# Patient Record
Sex: Male | Born: 1955 | Race: White | Hispanic: No | Marital: Married | Smoking: Never smoker
Health system: Southern US, Community
[De-identification: ages and names within clinical notes are randomized; demographics above are authoritative.]

## PROBLEM LIST (undated history)

## (undated) DIAGNOSIS — R51 Headache: Secondary | ICD-10-CM

## (undated) DIAGNOSIS — I10 Essential (primary) hypertension: Secondary | ICD-10-CM

## (undated) DIAGNOSIS — T7840XA Allergy, unspecified, initial encounter: Secondary | ICD-10-CM

## (undated) DIAGNOSIS — E785 Hyperlipidemia, unspecified: Secondary | ICD-10-CM

## (undated) DIAGNOSIS — J329 Chronic sinusitis, unspecified: Secondary | ICD-10-CM

## (undated) DIAGNOSIS — K449 Diaphragmatic hernia without obstruction or gangrene: Secondary | ICD-10-CM

## (undated) HISTORY — PX: WISDOM TOOTH EXTRACTION: SHX21

## (undated) HISTORY — DX: Essential (primary) hypertension: I10

## (undated) HISTORY — PX: OTHER SURGICAL HISTORY: SHX169

## (undated) HISTORY — DX: Hyperlipidemia, unspecified: E78.5

## (undated) HISTORY — PX: COLONOSCOPY: SHX174

## (undated) HISTORY — DX: Chronic sinusitis, unspecified: J32.9

## (undated) HISTORY — DX: Headache: R51

## (undated) HISTORY — DX: Allergy, unspecified, initial encounter: T78.40XA

---

## 2004-04-08 ENCOUNTER — Ambulatory Visit: Payer: Self-pay | Admitting: Internal Medicine

## 2004-04-16 ENCOUNTER — Ambulatory Visit: Payer: Self-pay | Admitting: Internal Medicine

## 2004-10-08 ENCOUNTER — Ambulatory Visit: Payer: Self-pay | Admitting: Internal Medicine

## 2004-11-24 ENCOUNTER — Ambulatory Visit: Payer: Self-pay | Admitting: Internal Medicine

## 2005-05-17 ENCOUNTER — Ambulatory Visit: Payer: Self-pay | Admitting: Internal Medicine

## 2005-05-24 ENCOUNTER — Ambulatory Visit: Payer: Self-pay | Admitting: Internal Medicine

## 2006-01-18 ENCOUNTER — Ambulatory Visit: Payer: Self-pay | Admitting: Internal Medicine

## 2006-01-25 ENCOUNTER — Ambulatory Visit: Payer: Self-pay | Admitting: Internal Medicine

## 2006-08-05 ENCOUNTER — Ambulatory Visit: Payer: Self-pay | Admitting: Internal Medicine

## 2006-08-05 LAB — CONVERTED CEMR LAB
ALT: 31 units/L (ref 0–40)
AST: 27 units/L (ref 0–37)
Albumin: 4.4 g/dL (ref 3.5–5.2)
Alkaline Phosphatase: 65 units/L (ref 39–117)
Basophils Absolute: 0 10*3/uL (ref 0.0–0.1)
Calcium: 9.3 mg/dL (ref 8.4–10.5)
Chloride: 105 meq/L (ref 96–112)
Eosinophils Absolute: 0.1 10*3/uL (ref 0.0–0.6)
Eosinophils Relative: 1.8 % (ref 0.0–5.0)
GFR calc non Af Amer: 84 mL/min
Glucose, Bld: 74 mg/dL (ref 70–99)
HDL: 42.6 mg/dL (ref >39.0)
MCV: 91.6 fL (ref 78.0–100.0)
Platelets: 121 10*3/uL — ABNORMAL LOW (ref 150–400)
RBC: 4.98 M/uL (ref 4.22–5.81)
Triglycerides: 276 mg/dL (ref 0–149)
WBC: 4.7 10*3/uL (ref 4.5–10.5)

## 2006-08-16 ENCOUNTER — Ambulatory Visit: Payer: Self-pay | Admitting: Internal Medicine

## 2006-08-26 ENCOUNTER — Ambulatory Visit: Payer: Self-pay | Admitting: Gastroenterology

## 2006-09-12 ENCOUNTER — Ambulatory Visit: Payer: Self-pay | Admitting: Gastroenterology

## 2006-09-12 LAB — HM COLONOSCOPY

## 2006-10-19 ENCOUNTER — Ambulatory Visit: Payer: Self-pay | Admitting: Family Medicine

## 2006-12-08 DIAGNOSIS — G43909 Migraine, unspecified, not intractable, without status migrainosus: Secondary | ICD-10-CM | POA: Insufficient documentation

## 2007-02-28 ENCOUNTER — Ambulatory Visit: Payer: Self-pay | Admitting: Internal Medicine

## 2007-02-28 LAB — CONVERTED CEMR LAB
Bilirubin, Direct: 0.2 mg/dL (ref 0.0–0.3)
Cholesterol: 155 mg/dL (ref 0–200)
HDL: 38.3 mg/dL — ABNORMAL LOW (ref >39.0)
LDL Cholesterol: 82 mg/dL (ref 0–99)
Total CHOL/HDL Ratio: 4
Total Protein: 6.9 g/dL (ref 6.0–8.3)
Triglycerides: 175 mg/dL — ABNORMAL HIGH (ref 0–149)

## 2007-03-07 ENCOUNTER — Ambulatory Visit: Payer: Self-pay | Admitting: Internal Medicine

## 2007-03-07 DIAGNOSIS — E785 Hyperlipidemia, unspecified: Secondary | ICD-10-CM | POA: Insufficient documentation

## 2007-03-07 LAB — CONVERTED CEMR LAB
HDL goal, serum: 40 mg/dL
LDL Goal: 130 mg/dL

## 2007-04-10 ENCOUNTER — Telehealth: Payer: Self-pay | Admitting: Internal Medicine

## 2007-04-13 ENCOUNTER — Ambulatory Visit: Payer: Self-pay | Admitting: Internal Medicine

## 2007-04-13 DIAGNOSIS — L600 Ingrowing nail: Secondary | ICD-10-CM | POA: Insufficient documentation

## 2007-04-14 ENCOUNTER — Telehealth: Payer: Self-pay | Admitting: Internal Medicine

## 2007-07-25 ENCOUNTER — Ambulatory Visit: Payer: Self-pay | Admitting: Internal Medicine

## 2007-07-25 LAB — CONVERTED CEMR LAB
ALT: 28 units/L (ref 0–53)
AST: 27 units/L (ref 0–37)
Albumin: 4.4 g/dL (ref 3.5–5.2)
Alkaline Phosphatase: 61 units/L (ref 39–117)
BUN: 17 mg/dL (ref 6–23)
Basophils Relative: 0.5 % (ref 0.0–1.0)
CO2: 30 meq/L (ref 19–32)
Chloride: 105 meq/L (ref 96–112)
Creatinine, Ser: 1 mg/dL (ref 0.4–1.5)
Direct LDL: 99.4 mg/dL
Eosinophils Absolute: 0.1 10*3/uL (ref 0.0–0.7)
Eosinophils Relative: 2 % (ref 0.0–5.0)
GFR calc non Af Amer: 83 mL/min
Glucose, Bld: 93 mg/dL (ref 70–99)
HDL: 42.9 mg/dL (ref >39.0)
MCV: 92.8 fL (ref 78.0–100.0)
Monocytes Relative: 9.5 % (ref 3.0–12.0)
Neutrophils Relative %: 66.7 % (ref 43.0–77.0)
Nitrite: NEGATIVE
RBC: 4.99 M/uL (ref 4.22–5.81)
Specific Gravity, Urine: 1.025
Total CHOL/HDL Ratio: 4.9
Total Protein: 7 g/dL (ref 6.0–8.3)
Urobilinogen, UA: 0.2
WBC Urine, dipstick: NEGATIVE
WBC: 5.6 10*3/uL (ref 4.5–10.5)

## 2007-08-01 ENCOUNTER — Ambulatory Visit: Payer: Self-pay | Admitting: Internal Medicine

## 2008-03-13 ENCOUNTER — Ambulatory Visit: Payer: Self-pay | Admitting: Internal Medicine

## 2008-03-13 LAB — CONVERTED CEMR LAB
Alkaline Phosphatase: 69 units/L (ref 39–117)
Bilirubin, Direct: 0.2 mg/dL (ref 0.0–0.3)
LDL Cholesterol: 110 mg/dL — ABNORMAL HIGH (ref 0–99)
Total Bilirubin: 1.4 mg/dL — ABNORMAL HIGH (ref 0.3–1.2)
Total CHOL/HDL Ratio: 3.7
VLDL: 34 mg/dL (ref 0–40)

## 2008-03-18 ENCOUNTER — Ambulatory Visit: Payer: Self-pay | Admitting: Internal Medicine

## 2008-03-18 DIAGNOSIS — J0100 Acute maxillary sinusitis, unspecified: Secondary | ICD-10-CM | POA: Insufficient documentation

## 2008-11-27 ENCOUNTER — Ambulatory Visit: Payer: Self-pay | Admitting: Internal Medicine

## 2008-11-27 LAB — CONVERTED CEMR LAB
ALT: 27 units/L (ref 0–53)
Albumin: 4.4 g/dL (ref 3.5–5.2)
Alkaline Phosphatase: 61 units/L (ref 39–117)
Basophils Relative: 0.5 % (ref 0.0–3.0)
Bilirubin Urine: NEGATIVE
CO2: 32 meq/L (ref 19–32)
Chloride: 106 meq/L (ref 96–112)
Direct LDL: 83.5 mg/dL
Eosinophils Absolute: 0.1 10*3/uL (ref 0.0–0.7)
Eosinophils Relative: 2.1 % (ref 0.0–5.0)
Glucose, Urine, Semiquant: NEGATIVE
Hemoglobin: 15.8 g/dL (ref 13.0–17.0)
Ketones, urine, test strip: NEGATIVE
Lymphocytes Relative: 22.7 % (ref 12.0–46.0)
MCHC: 34.8 g/dL (ref 30.0–36.0)
MCV: 92.4 fL (ref 78.0–100.0)
Monocytes Absolute: 0.5 10*3/uL (ref 0.1–1.0)
Neutro Abs: 3.3 10*3/uL (ref 1.4–7.7)
Protein, U semiquant: NEGATIVE
RBC: 4.92 M/uL (ref 4.22–5.81)
Sodium: 143 meq/L (ref 135–145)
Total CHOL/HDL Ratio: 5
Total Protein: 7.2 g/dL (ref 6.0–8.3)
Urobilinogen, UA: 0.2
pH: 7.5

## 2008-12-06 ENCOUNTER — Ambulatory Visit: Payer: Self-pay | Admitting: Internal Medicine

## 2008-12-06 DIAGNOSIS — R972 Elevated prostate specific antigen [PSA]: Secondary | ICD-10-CM | POA: Insufficient documentation

## 2008-12-27 ENCOUNTER — Ambulatory Visit: Payer: Self-pay | Admitting: Internal Medicine

## 2009-01-03 ENCOUNTER — Telehealth: Payer: Self-pay | Admitting: Internal Medicine

## 2009-05-27 ENCOUNTER — Ambulatory Visit: Payer: Self-pay | Admitting: Internal Medicine

## 2009-05-27 LAB — CONVERTED CEMR LAB
Albumin: 4.3 g/dL (ref 3.5–5.2)
Alkaline Phosphatase: 54 units/L (ref 39–117)
Cholesterol: 187 mg/dL (ref 0–200)
Direct LDL: 84.1 mg/dL
HDL: 48.3 mg/dL (ref >39.00)
Total CHOL/HDL Ratio: 4
VLDL: 60.2 mg/dL — ABNORMAL HIGH (ref 0.0–40.0)

## 2009-06-03 ENCOUNTER — Ambulatory Visit: Payer: Self-pay | Admitting: Internal Medicine

## 2009-12-03 ENCOUNTER — Ambulatory Visit: Payer: Self-pay | Admitting: Family Medicine

## 2009-12-03 DIAGNOSIS — H6120 Impacted cerumen, unspecified ear: Secondary | ICD-10-CM | POA: Insufficient documentation

## 2009-12-03 DIAGNOSIS — R030 Elevated blood-pressure reading, without diagnosis of hypertension: Secondary | ICD-10-CM | POA: Insufficient documentation

## 2009-12-09 ENCOUNTER — Ambulatory Visit: Payer: Self-pay | Admitting: Internal Medicine

## 2009-12-09 ENCOUNTER — Telehealth: Payer: Self-pay | Admitting: Internal Medicine

## 2009-12-09 DIAGNOSIS — I10 Essential (primary) hypertension: Secondary | ICD-10-CM | POA: Insufficient documentation

## 2009-12-09 LAB — CONVERTED CEMR LAB
Calcium: 9.5 mg/dL (ref 8.4–10.5)
Free T4: 0.78 ng/dL (ref 0.60–1.60)
GFR calc non Af Amer: 89.74 mL/min (ref >60)
Glucose, Bld: 129 mg/dL — ABNORMAL HIGH (ref 70–99)
Potassium: 3.9 meq/L (ref 3.5–5.1)
Sodium: 138 meq/L (ref 135–145)
T3, Free: 2.8 pg/mL (ref 2.3–4.2)
TSH: 2.4 microintl units/mL (ref 0.35–5.50)

## 2009-12-19 ENCOUNTER — Ambulatory Visit: Payer: Self-pay | Admitting: Internal Medicine

## 2010-02-05 ENCOUNTER — Ambulatory Visit: Payer: Self-pay | Admitting: Internal Medicine

## 2010-02-05 LAB — CONVERTED CEMR LAB
AST: 25 units/L (ref 0–37)
Albumin: 4.4 g/dL (ref 3.5–5.2)
Alkaline Phosphatase: 69 units/L (ref 39–117)
BUN: 18 mg/dL (ref 6–23)
Basophils Absolute: 0 10*3/uL (ref 0.0–0.1)
Bilirubin Urine: NEGATIVE
CO2: 29 meq/L (ref 19–32)
Chloride: 102 meq/L (ref 96–112)
Cholesterol: 190 mg/dL (ref 0–200)
Creatinine, Ser: 1 mg/dL (ref 0.4–1.5)
GFR calc non Af Amer: 80.62 mL/min (ref >60)
Glucose, Urine, Semiquant: NEGATIVE
Hemoglobin: 15.3 g/dL (ref 13.0–17.0)
Lymphocytes Relative: 27.4 % (ref 12.0–46.0)
Monocytes Relative: 8.3 % (ref 3.0–12.0)
Neutro Abs: 3.1 10*3/uL (ref 1.4–7.7)
Potassium: 4.9 meq/L (ref 3.5–5.1)
RDW: 12.6 % (ref 11.5–14.6)
Total Bilirubin: 1.3 mg/dL — ABNORMAL HIGH (ref 0.3–1.2)
WBC Urine, dipstick: NEGATIVE
pH: 5.5

## 2010-02-23 ENCOUNTER — Ambulatory Visit: Payer: Self-pay | Admitting: Internal Medicine

## 2010-05-18 ENCOUNTER — Other Ambulatory Visit: Payer: Self-pay | Admitting: Internal Medicine

## 2010-05-18 ENCOUNTER — Ambulatory Visit
Admission: RE | Admit: 2010-05-18 | Discharge: 2010-05-18 | Payer: Self-pay | Source: Home / Self Care | Attending: Internal Medicine | Admitting: Internal Medicine

## 2010-05-18 LAB — HEPATIC FUNCTION PANEL
Albumin: 4.3 g/dL (ref 3.5–5.2)
Alkaline Phosphatase: 59 U/L (ref 39–117)
Bilirubin, Direct: 0.2 mg/dL (ref 0.0–0.3)
Total Bilirubin: 1.2 mg/dL (ref 0.3–1.2)

## 2010-05-18 LAB — LIPID PANEL
Total CHOL/HDL Ratio: 3
VLDL: 40.6 mg/dL — ABNORMAL HIGH (ref 0.0–40.0)

## 2010-05-25 ENCOUNTER — Ambulatory Visit
Admission: RE | Admit: 2010-05-25 | Discharge: 2010-05-25 | Payer: Self-pay | Source: Home / Self Care | Attending: Internal Medicine | Admitting: Internal Medicine

## 2010-05-25 DIAGNOSIS — F411 Generalized anxiety disorder: Secondary | ICD-10-CM | POA: Insufficient documentation

## 2010-05-26 NOTE — Assessment & Plan Note (Signed)
Summary: bp ch eck/bmw   Vital Signs:  Patient profile:   55 year old male Height:      72 inches Weight:      192 pounds BMI:     26.13 Temp:     98.2 degrees F oral Pulse rate:   72 / minute Resp:     14 per minute BP sitting:   140 / 80  (left arm)  Vitals Entered By: Willy Eddy, LPN (December 19, 2009 10:35 AM) CC: roa bpcheck- pt is taking bp reading at night along with pills  Is Patient Diabetic? No   CC:  roa bpcheck- pt is taking bp reading at night along with pills .  History of Present Illness: monitering of initiatin of bloods pressure medications home monitering record presented average home reading 125/80  Preventive Screening-Counseling & Management  Alcohol-Tobacco     Smoking Status: never  Problems Prior to Update: 1)  Essential Hypertension  (ICD-401.9) 2)  Elevated Bp Reading Without Dx Hypertension  (ICD-796.2) 3)  Cerumen Impaction, Bilateral  (ICD-380.4) 4)  Prostate Specific Antigen, Elevated  (ICD-790.93) 5)  Sinusitis, Maxillary, Acute  (ICD-461.0) 6)  Well Adult Exam  (ICD-V70.0) 7)  Ingrown Toenail, Infected  (ICD-703.0) 8)  Hyperlipidemia  (ICD-272.4) 9)  Headache  (ICD-784.0)  Current Problems (verified): 1)  Essential Hypertension  (ICD-401.9) 2)  Elevated Bp Reading Without Dx Hypertension  (ICD-796.2) 3)  Cerumen Impaction, Bilateral  (ICD-380.4) 4)  Prostate Specific Antigen, Elevated  (ICD-790.93) 5)  Sinusitis, Maxillary, Acute  (ICD-461.0) 6)  Well Adult Exam  (ICD-V70.0) 7)  Ingrown Toenail, Infected  (ICD-703.0) 8)  Hyperlipidemia  (ICD-272.4) 9)  Headache  (ICD-784.0)  Medications Prior to Update: 1)  Zocor 20 Mg  Tabs (Simvastatin) .... Once Daily 2)  Imitrex 25 Mg  Tabs (Sumatriptan Succinate) .... As Needed Headaches 3)  Nasonex 50 Mcg/act Susp (Mometasone Furoate) .... Two Sprays Q Nare Q Day 4)  Bee Pollen 580 Mg Caps (Bee Pollen) .Marland Kitchen.. 1 Once Daily 5)  Multivitamins  Caps (Multiple Vitamin) .Marland Kitchen.. 1 Once  Daily 6)  Vita-Plus E 400 Unit Caps (Vitamin E) .Marland Kitchen.. 1 Once Daily 7)  Bisoprolol Fumarate 5 Mg Tabs (Bisoprolol Fumarate) .... 1/2 Tablet By Mouth Daily For 1-2 Weeks The Increased To 1  A Day If The Blood Pressure Is Greater That 140/90  Current Medications (verified): 1)  Zocor 20 Mg  Tabs (Simvastatin) .... Once Daily 2)  Imitrex 25 Mg  Tabs (Sumatriptan Succinate) .... As Needed Headaches 3)  Nasonex 50 Mcg/act Susp (Mometasone Furoate) .... Two Sprays Q Nare Q Day 4)  Bee Pollen 580 Mg Caps (Bee Pollen) .Marland Kitchen.. 1 Once Daily 5)  Multivitamins  Caps (Multiple Vitamin) .Marland Kitchen.. 1 Once Daily 6)  Vita-Plus E 400 Unit Caps (Vitamin E) .Marland Kitchen.. 1 Once Daily 7)  Bisoprolol Fumarate 5 Mg Tabs (Bisoprolol Fumarate) .... 1/2 Tablet By Mouth Daily For 1-2 Weeks The Increased To 1  A Day If The Blood Pressure Is Greater That 140/90  Allergies (verified): No Known Drug Allergies  Past History:  Family History: Last updated: 12/08/2006 Family History of Glu[place] Family History Hypertension  Social History: Last updated: 12/08/2006 Occupation: Married  Risk Factors: Smoking Status: never (12/19/2009)  Past medical, surgical, family and social histories (including risk factors) reviewed, and no changes noted (except as noted below).  Past Medical History: Reviewed history from 03/07/2007 and no changes required. Headache/Migraines Sinusitis Hyperlipidemia  Past Surgical History: Reviewed history from 12/08/2006 and no changes  required. Colonoscopy  Family History: Reviewed history from 12/08/2006 and no changes required. Family History of [place] Family History Hypertension  Social History: Reviewed history from 12/08/2006 and no changes required. Occupation: Married  Physical Exam  General:  Well-developed,well-nourished,in no acute distress; alert,appropriate and cooperative throughout examination Lungs:  Normal respiratory effort, chest expands symmetrically. Lungs are clear to  auscultation, no crackles or wheezes. Heart:  Normal rate and regular rhythm. S1 and S2 normal without gallop, murmur, click, rub or other extra sounds.   Impression & Recommendations:  Problem # 1:  ESSENTIAL HYPERTENSION (ICD-401.9) Assessment Improved stable His updated medication list for this problem includes:    Bisoprolol Fumarate 5 Mg Tabs (Bisoprolol fumarate) .Marland Kitchen... 1/2 tablet by mouth daily for 1-2 weeks the increased to 1  a day if the blood pressure is greater that 140/90  BP today: 140/80 Prior BP: 182/104 (12/03/2009)  Prior 10 Yr Risk Heart Disease: 9 % (06/03/2009)  Labs Reviewed: K+: 3.9 (12/09/2009) Creat: : 0.9 (12/09/2009)   Chol: 187 (05/27/2009)   HDL: 48.30 (05/27/2009)   LDL: 110 (03/13/2008)   TG: 301.0 (05/27/2009)  Problem # 2:  HYPERLIPIDEMIA (ICD-272.4)  His updated medication list for this problem includes:    Zocor 20 Mg Tabs (Simvastatin) ..... Once daily  Labs Reviewed: SGOT: 28 (05/27/2009)   SGPT: 30 (05/27/2009)  Lipid Goals: Chol Goal: 200 (03/07/2007)   HDL Goal: 40 (03/07/2007)   LDL Goal: 160 (03/18/2008)   TG Goal: 150 (03/07/2007)  Prior 10 Yr Risk Heart Disease: 9 % (06/03/2009)   HDL:48.30 (05/27/2009), 44.40 (11/27/2008)  LDL:110 (03/13/2008), DEL (07/25/2007)  Chol:187 (05/27/2009), 209 (11/27/2008)  Trig:301.0 (05/27/2009), 378.0 (11/27/2008)  Complete Medication List: 1)  Zocor 20 Mg Tabs (Simvastatin) .... Once daily 2)  Imitrex 25 Mg Tabs (Sumatriptan succinate) .... As needed headaches 3)  Nasonex 50 Mcg/act Susp (Mometasone furoate) .... Two sprays q nare q day 4)  Bee Pollen 580 Mg Caps (Bee pollen) .Marland Kitchen.. 1 once daily 5)  Multivitamins Caps (Multiple vitamin) .Marland Kitchen.. 1 once daily 6)  Vita-plus E 400 Unit Caps (Vitamin e) .Marland Kitchen.. 1 once daily 7)  Bisoprolol Fumarate 5 Mg Tabs (Bisoprolol fumarate) .... 1/2 tablet by mouth daily for 1-2 weeks the increased to 1  a day if the blood pressure is greater that 140/90  Patient  Instructions: 1)  keep cpx

## 2010-05-26 NOTE — Progress Notes (Signed)
Summary: BP elev  Phone Note Call from Patient   Summary of Call: BP up at teeth cleaning.  [phone].  No symptoms of headache or dizziness.  White coat. Dr. Abner Greenspan.  Nothing exertional.  Stay cool, unstressed, lie down meanwhile.  Rudy Jew, RN  December 09, 2009 11:53 AM  Initial call taken by: Rudy Jew, RN,  December 09, 2009 11:48 AM  Follow-up for Phone Call        ov with dr Lenard Forth at 2:30 Follow-up by: Willy Eddy, LPN,  December 09, 2009 11:59 AM

## 2010-05-26 NOTE — Assessment & Plan Note (Signed)
Summary: leg ear clogged/njr   Vital Signs:  Patient profile:   55 year old male Height:      72 inches (182.88 cm) Weight:      196 pounds (89.09 kg) O2 Sat:      98 % on Room air Temp:     98.0 degrees F (36.67 degrees C) oral Pulse rate:   76 / minute BP sitting:   182 / 104  (left arm) Cuff size:   regular  Vitals Entered By: Josph Macho RMA (December 03, 2009 11:23 AM)  O2 Flow:  Room air  Serial Vital Signs/Assessments:  Time      Position  BP       Pulse  Resp  Temp     By                     168/98                         Danise Edge MD  CC: Left ear clogged/ CF Is Patient Diabetic? No   History of Present Illness: Patient is in today to have a clogged ear evaluated. [place] the [place] with [place] past weekend and they noted some wax in his ear canal, his wife suggested he use a Qtip to get it out. He had never done that before and when he tried he was unable to get anything out and it has felt even more plugged since then. He is having trouble hearing with his left ear inparticular especially when he is on the phone. He has been trying ear drops for several days without success, he denies any HA/f/c/congestion/cough/sore throat/CP/palp/SOB/malaise. he needs to talk extensively about how irritated he is with his wife about the Qtip suggestion and he believes that is why his BP is up.  Current Medications (verified): 1)  Zocor 20 Mg  Tabs (Simvastatin) .... Once Daily 2)  Imitrex 25 Mg  Tabs (Sumatriptan Succinate) .... As Needed Headaches 3)  Nasonex 50 Mcg/act Susp (Mometasone Furoate) .... Two Sprays Q Nare Q Day  Allergies (verified): No Known Drug Allergies  Past History:  Past medical history reviewed for relevance to current acute and chronic problems. Social history (including risk factors) reviewed for relevance to current acute and chronic problems.  Past Medical History: Reviewed history from 03/07/2007 and no changes  required. Headache/Migraines Sinusitis Hyperlipidemia  Social History: Reviewed history from 12/08/2006 and no changes required. Occupation: Married  Review of Systems      See HPI  Physical Exam  General:  Well-developed,well-nourished,in no acute distress; alert,appropriate and cooperative throughout examination Head:  Normocephalic and atraumatic without obvious abnormalities. No apparent alopecia or balding. Ears:  b/l TMs initially obscured by cerumen, left side wax is soft, right side is dry and packed. Cerumen is flushed out easily and canals are clear, skin is slightly macerated and red b/l but TM is clear/intact Nose:  External nasal examination shows no deformity or inflammation. Nasal mucosa are pink  Mouth:  Oral mucosa and oropharynx without lesions or exudates Neck:  No deformities, masses, or tenderness noted. Lungs:  Normal respiratory effort, chest expands symmetrically. Lungs are clear to auscultation, no crackles or wheezes. Heart:  Normal rate and regular rhythm. S1 and S2 normal without gallop, murmur, click, rub or other extra sounds. Abdomen:  Bowel sounds positive,abdomen soft and non-tender without masses, organomegaly or hernias noted. Psych:  Cognition and judgment appear intact.  Alert and cooperative with normal attention span and concentration. No apparent delusions, illusions, hallucinations. Anxious   Impression & Recommendations:  Problem # 1:  CERUMEN IMPACTION, BILATERAL (ICD-380.4) Both ears disimpacted well. Patient tolerated procedure well. Mild OE noted b/l so sent Cortisporin Otic 4 drops three times a day b/l x 7-10 days.  Problem # 2:  ELEVATED BP READING WITHOUT DX HYPERTENSION (ICD-796.2) Has taken some sudafed, drunk excessive caffeine, and eaten more sodium than usual. He is going to redouble his efforts to get adequate hydration and sleep. Minimize caffeine, alcohol, sodiudm.  No Sudafed, Keep appt with PMD later this month to  reevaluate  Complete Medication List: 1)  Zocor 20 Mg Tabs (Simvastatin) .... Once daily 2)  Imitrex 25 Mg Tabs (Sumatriptan succinate) .... As needed headaches 3)  Nasonex 50 Mcg/act Susp (Mometasone furoate) .... Two sprays q nare q day 4)  Cortisporin 3.5-10000-1 Soln (Neomycin-polymyxin-hc) .... 4 drops b/l ears three times a day x 7-10 days  Patient Instructions: 1)  Please schedule a follow-up appointment as needed if symptoms 2)  pick up drops and use for 7 days  and to 10 days if symptoms persist Prescriptions: CORTISPORIN 3.5-10000-1 SOLN (NEOMYCIN-POLYMYXIN-HC) 4 drops b/l ears three times a day x 7-10 days  #1 bottle x 0   Entered and Authorized by:   Danise Edge MD   Signed by:   Danise Edge MD on 12/03/2009   Method used:   Electronically to        [facility] [phone]* (retail)       [address]       [place], [place]  [address]       Ph: [phone]       Fax: [phone]   RxID:   [id number]

## 2010-05-26 NOTE — Assessment & Plan Note (Signed)
Summary: 6 MONTH ROV/NJR   Vital Signs:  Patient profile:   55 year old male Height:      72 inches Weight:      182 pounds BMI:     24.77 Temp:     98.2 degrees F oral Pulse rate:   76 / minute Resp:     14 per minute BP sitting:   136 / 90  (left arm)  Vitals Entered By: Willy Eddy, LPN (June 03, 2009 9:01 AM) CC: roa labs, Lipid Management   CC:  roa labs and Lipid Management.  History of Present Illness: Weight loss   Hyperlipidemia Follow-Up      This is a 55 year old man who presents for Hyperlipidemia follow-up.  The patient denies the following symptoms: chest pain/pressure, exercise intolerance, dypsnea, palpitations, syncope, and pedal edema.  Compliance with medications (by patient report) has been near 100%.  Dietary compliance has been excellent.  The patient reports exercising 3-4X per week.  Adjunctive measures currently used by the patient include ASA and weight reduction.    Lipid Management History:      Positive NCEP/ATP III risk factors include male age 2 years old or older.  Negative NCEP/ATP III risk factors include non-diabetic, no family history for ischemic heart disease, non-tobacco-user status, non-hypertensive, no ASHD (atherosclerotic heart disease), no prior stroke/TIA, no peripheral vascular disease, and no history of aortic aneurysm.     Preventive Screening-Counseling & Management  Alcohol-Tobacco     Smoking Status: never  Problems Prior to Update: 1)  Prostate Specific Antigen, Elevated  (ICD-790.93) 2)  Sinusitis, Maxillary, Acute  (ICD-461.0) 3)  Well Adult Exam  (ICD-V70.0) 4)  Ingrown Toenail, Infected  (ICD-703.0) 5)  Hyperlipidemia  (ICD-272.4) 6)  Headache  (ICD-784.0)  Current Problems (verified): 1)  Prostate Specific Antigen, Elevated  (ICD-790.93) 2)  Sinusitis, Maxillary, Acute  (ICD-461.0) 3)  Well Adult Exam  (ICD-V70.0) 4)  Ingrown Toenail, Infected  (ICD-703.0) 5)  Hyperlipidemia  (ICD-272.4) 6)  Headache   (ICD-784.0)  Medications Prior to Update: 1)  Zocor 20 Mg  Tabs (Simvastatin) .... Once Daily 2)  Imitrex 25 Mg  Tabs (Sumatriptan Succinate) .... As Needed Headaches 3)  Nasonex 50 Mcg/act Susp (Mometasone Furoate) .... Two Sprays Q Nare Q Day 4)  Ciprofloxacin Hcl 500 Mg Tabs (Ciprofloxacin Hcl) .... One By Mouth Two Times A Day For 14 Days  Current Medications (verified): 1)  Zocor 20 Mg  Tabs (Simvastatin) .... Once Daily 2)  Imitrex 25 Mg  Tabs (Sumatriptan Succinate) .... As Needed Headaches 3)  Nasonex 50 Mcg/act Susp (Mometasone Furoate) .... Two Sprays Q Nare Q Day  Allergies (verified): No Known Drug Allergies  Past History:  Family History: Last updated: 12/08/2006 Family History of Glu[place] Family History Hypertension  Social History: Last updated: 12/08/2006 Occupation: Married  Risk Factors: Smoking Status: never (06/03/2009)  Past medical, surgical, family and social histories (including risk factors) reviewed, and no changes noted (except as noted below).  Past Medical History: Reviewed history from 03/07/2007 and no changes required. Headache/Migraines Sinusitis Hyperlipidemia  Past Surgical History: Reviewed history from 12/08/2006 and no changes required. Colonoscopy  Family History: Reviewed history from 12/08/2006 and no changes required. Family History of Glu[place] Family History Hypertension  Social History: Reviewed history from 12/08/2006 and no changes required. Occupation: Married Smoking Status:  never  Review of Systems  The patient denies anorexia, fever, weight loss, weight gain, vision loss, decreased hearing, hoarseness, chest pain, syncope, dyspnea  on exertion, peripheral edema, prolonged cough, headaches, hemoptysis, abdominal pain, melena, hematochezia, hematuria, incontinence, genital sores, muscle weakness, suspicious skin lesions, transient blindness, difficulty walking, depression, unusual weight change, abnormal  bleeding, enlarged lymph nodes, angioedema, breast masses, and testicular masses.    Physical Exam  General:  well-developed and well-nourished.   Head:  no abnormalities observed and no abnormalities palpated.   Ears:  R ear normal and L ear normal.   Nose:  nasal dischargemucosal pallor, mucosal erythema, and mucosal edema.   Mouth:  pharynx pink and moist.   Neck:  No deformities, masses, or tenderness noted. Lungs:  Normal respiratory effort, chest expands symmetrically. Lungs are clear to auscultation, no crackles or wheezes. Heart:  Normal rate and regular rhythm. S1 and S2 normal without gallop, murmur, click, rub or other extra sounds. Abdomen:  Bowel sounds positive,abdomen soft and non-tender without masses, organomegaly or hernias noted. Msk:  No deformity or scoliosis noted of thoracic or lumbar spine.   Pulses:  R and L carotid,radial,femoral,dorsalis pedis and posterior tibial pulses are full and equal bilaterally Extremities:  No clubbing, cyanosis, edema, or deformity noted with normal full range of motion of all joints.   Neurologic:  No cranial nerve deficits noted. Station and gait are normal. Plantar reflexes are down-going bilaterally. DTRs are symmetrical throughout. Sensory, motor and coordinative functions appear intact. Psych:  Oriented X3 and memory intact for recent and remote.     Impression & Recommendations:  Problem # 1:  SINUSITIS, MAXILLARY, ACUTE (ICD-461.0) Assessment Improved resolved The following medications were removed from the medication list:    Ciprofloxacin Hcl 500 Mg Tabs (Ciprofloxacin hcl) ..... One by mouth two times a day for 14 days His updated medication list for this problem includes:    Nasonex 50 Mcg/act Susp (Mometasone furoate) .Marland Kitchen..Marland Kitchen Two sprays q nare q day  Instructed on treatment. Call if symptoms persist or worsen.   Problem # 2:  HYPERLIPIDEMIA (ICD-272.4) Assessment: Improved  His updated medication list for this problem  includes:    Zocor 20 Mg Tabs (Simvastatin) ..... Once daily  Labs Reviewed: SGOT: 28 (05/27/2009)   SGPT: 30 (05/27/2009)  Lipid Goals: Chol Goal: 200 (03/07/2007)   HDL Goal: 40 (03/07/2007)   LDL Goal: 160 (03/18/2008)   TG Goal: 150 (03/07/2007)  10 Yr Risk Heart Disease: 9 % Prior 10 Yr Risk Heart Disease: 7 % (03/18/2008)   HDL:48.30 (05/27/2009), 44.40 (11/27/2008)  LDL:110 (03/13/2008), DEL (07/25/2007)  Chol:187 (05/27/2009), 209 (11/27/2008)  Trig:301.0 (05/27/2009), 378.0 (11/27/2008)  Problem # 3:  PROSTATE SPECIFIC ANTIGEN, ELEVATED (ICD-790.93) monitering every six months  Complete Medication List: 1)  Zocor 20 Mg Tabs (Simvastatin) .... Once daily 2)  Imitrex 25 Mg Tabs (Sumatriptan succinate) .... As needed headaches 3)  Nasonex 50 Mcg/act Susp (Mometasone furoate) .... Two sprays q nare q day  Lipid Assessment/Plan:      Based on NCEP/ATP III, the patient's risk factor category is "0-1 risk factors".  The patient's lipid goals are as follows: Total cholesterol goal is 200; LDL cholesterol goal is 160; HDL cholesterol goal is 40; Triglyceride goal is 150.  His LDL cholesterol goal has been met.    Patient Instructions: 1)  CPX in August   Immunization History:  Influenza Immunization History:    Influenza:  historical (01/03/2009)

## 2010-05-26 NOTE — Assessment & Plan Note (Signed)
Summary: CPX/NJR/PT [facility] FROM BMP/CJR/PT [facility]/CJR   Vital Signs:  Patient profile:   55 year old male Height:      72 inches Weight:      194 pounds BMI:     26.41 Temp:     98.2 degrees F oral Pulse rate:   72 / minute Resp:     14 per minute BP sitting:   148 / 90  (left arm)  Vitals Entered By: Willy Eddy, LPN (February 23, 2010 3:21 PM) CC: cpx= pt states his bp has been running around 130/88-140/88, Hypertension Management Is Patient Diabetic? No   Primary Care Provider:  Stacie Glaze MD  CC:  cpx= pt states his bp has been running around 130/88-140/88 and Hypertension Management.  History of Present Illness: The pt was asked about all immunizations, health maint. services that are appropriate to their age and was given guidance on diet exercize  and weight management   Hypertension History:      He denies headache, chest pain, palpitations, dyspnea with exertion, orthopnea, PND, peripheral edema, visual symptoms, neurologic problems, syncope, and side effects from treatment.        Positive major cardiovascular risk factors include male age 67 years old or older, hyperlipidemia, and hypertension.  Negative major cardiovascular risk factors include no history of diabetes, negative family history for ischemic heart disease, and non-tobacco-user status.        Further assessment for target organ damage reveals no history of ASHD, stroke/TIA, or peripheral vascular disease.     Preventive Screening-Counseling & Management  Alcohol-Tobacco     Smoking Status: never     Tobacco Counseling: to remain off tobacco products  Problems Prior to Update: 1)  Essential Hypertension  (ICD-401.9) 2)  Elevated Bp Reading Without Dx Hypertension  (ICD-796.2) 3)  Cerumen Impaction, Bilateral  (ICD-380.4) 4)  Prostate Specific Antigen, Elevated  (ICD-790.93) 5)  Sinusitis, Maxillary, Acute  (ICD-461.0) 6)  Well Adult Exam  (ICD-V70.0) 7)  Ingrown Toenail, Infected   (ICD-703.0) 8)  Hyperlipidemia  (ICD-272.4) 9)  Headache  (ICD-784.0)  Current Problems (verified): 1)  Essential Hypertension  (ICD-401.9) 2)  Elevated Bp Reading Without Dx Hypertension  (ICD-796.2) 3)  Cerumen Impaction, Bilateral  (ICD-380.4) 4)  Prostate Specific Antigen, Elevated  (ICD-790.93) 5)  Sinusitis, Maxillary, Acute  (ICD-461.0) 6)  Well Adult Exam  (ICD-V70.0) 7)  Ingrown Toenail, Infected  (ICD-703.0) 8)  Hyperlipidemia  (ICD-272.4) 9)  Headache  (ICD-784.0)  Medications Prior to Update: 1)  Zocor 20 Mg  Tabs (Simvastatin) .... Once Daily 2)  Imitrex 25 Mg  Tabs (Sumatriptan Succinate) .... As Needed Headaches 3)  Nasonex 50 Mcg/act Susp (Mometasone Furoate) .... Two Sprays Q Nare Q Day 4)  Bee Pollen 580 Mg Caps (Bee Pollen) .Marland Kitchen.. 1 Once Daily 5)  Multivitamins  Caps (Multiple Vitamin) .Marland Kitchen.. 1 Once Daily 6)  Vita-Plus E 400 Unit Caps (Vitamin E) .Marland Kitchen.. 1 Once Daily 7)  Bisoprolol Fumarate 5 Mg Tabs (Bisoprolol Fumarate) .... 1/2 Tablet By Mouth Daily For 1-2 Weeks The Increased To 1  A Day If The Blood Pressure Is Greater That 140/90  Current Medications (verified): 1)  Zocor 20 Mg  Tabs (Simvastatin) .... Once Daily 2)  Imitrex 25 Mg  Tabs (Sumatriptan Succinate) .... As Needed Headaches 3)  Nasonex 50 Mcg/act Susp (Mometasone Furoate) .... Two Sprays Q Nare Q Day 4)  Multivitamins  Caps (Multiple Vitamin) .Marland Kitchen.. 1 Once Daily 5)  Vita-Plus E 400 Unit Caps (Vitamin  E) .... 1 Once Daily 6)  Bisoprolol Fumarate 5 Mg Tabs (Bisoprolol Fumarate) .Marland Kitchen.. 1 Once Daily  Allergies (verified): No Known Drug Allergies  Contraindications/Deferment of Procedures/Staging:    Test/Procedure: FLU VAX    Reason for deferment: patient declined   Past History:  Family History: Last updated: 12/08/2006 Family History of [place] Family History Hypertension  Social History: Last updated: 12/08/2006 Occupation: Married  Risk Factors: Smoking Status: never (02/23/2010)  Past  medical, surgical, family and social histories (including risk factors) reviewed, and no changes noted (except as noted below).  Past Medical History: Reviewed history from 03/07/2007 and no changes required. Headache/Migraines Sinusitis Hyperlipidemia  Past Surgical History: Reviewed history from 12/08/2006 and no changes required. Colonoscopy  Family History: Reviewed history from 12/08/2006 and no changes required. Family History of [place] Family History Hypertension  Social History: Reviewed history from 12/08/2006 and no changes required. Occupation: Married  Physical Exam  General:  Well-developed,well-nourished,in no acute distress; alert,appropriate and cooperative throughout examination Head:  Normocephalic and atraumatic without obvious abnormalities. No apparent alopecia or balding. Ears:  R ear normal and no external deformities.   Nose:  External nasal examination shows no deformity or inflammation. Nasal mucosa are pink  Mouth:  Oral mucosa and oropharynx without lesions or exudates Neck:  No deformities, masses, or tenderness noted. Lungs:  Normal respiratory effort, chest expands symmetrically. Lungs are clear to auscultation, no crackles or wheezes. Heart:  Normal rate and regular rhythm. S1 and S2 normal without gallop, murmur, click, rub or other extra sounds. Abdomen:  Bowel sounds positive,abdomen soft and non-tender without masses, organomegaly or hernias noted. Msk:  No deformity or scoliosis noted of thoracic or lumbar spine.   Neurologic:  No cranial nerve deficits noted. Station and gait are normal. Plantar reflexes are down-going bilaterally. DTRs are symmetrical throughout. Sensory, motor and coordinative functions appear intact.   Impression & Recommendations:  Problem # 1:  ELEVATED BP READING WITHOUT DX HYPERTENSION (ICD-796.2)  his blood pressure at home ranges in the normal range His updated medication list for this problem includes:     Bisoprolol Fumarate 5 Mg Tabs (Bisoprolol fumarate) .Marland Kitchen... 1 once daily  BP today: 160/110 Prior BP: 140/80 (12/19/2009)  Prior 10 Yr Risk Heart Disease: 14 % (12/09/2009)  Labs Reviewed: Creat: 1.0 (02/05/2010) Chol: 190 (02/05/2010)   HDL: 46.00 (02/05/2010)   LDL: 110 (03/13/2008)   TG: 368.0 (02/05/2010)  Instructed in low sodium diet (DASH Handout) and behavior modification.    Problem # 2:  WELL ADULT EXAM (ICD-V70.0) The pt was asked about all immunizations, health maint. services that are appropriate to their age and was given guidance on diet exercize  and weight management  Colonoscopy: normal (09/12/2006) Td Booster: Historical (04/26/2006)   Flu Vax: Historical (01/03/2009)   Chol: 190 (02/05/2010)   HDL: 46.00 (02/05/2010)   LDL: 110 (03/13/2008)   TG: 368.0 (02/05/2010) TSH: 4.86 (02/05/2010)   PSA: 1.10 (02/05/2010) Next Colonoscopy due:: 09/2016 (12/06/2008)  Discussed using sunscreen, use of alcohol, drug use, self testicular exam, routine dental care, routine eye care, routine physical exam, seat belts, multiple vitamins, osteoporosis prevention, adequate calcium intake in diet, and recommendations for immunizations.  Discussed exercise and checking cholesterol.  Discussed gun safety, safe sex, and contraception. Also recommend checking PSA.  Problem # 3:  HYPERLIPIDEMIA (ICD-272.4)  His updated medication list for this problem includes:    Vytorin 10-20 Mg Tabs (Ezetimibe-simvastatin) ..... One by mouth two times a day  Labs Reviewed: SGOT: 25 (  02/05/2010)   SGPT: 34 (02/05/2010)  Lipid Goals: Chol Goal: 200 (03/07/2007)   HDL Goal: 40 (03/07/2007)   LDL Goal: 160 (03/18/2008)   TG Goal: 150 (03/07/2007)  Prior 10 Yr Risk Heart Disease: 14 % (12/09/2009)   HDL:46.00 (02/05/2010), 48.30 (05/27/2009)  LDL:110 (03/13/2008), DEL (07/25/2007)  Chol:190 (02/05/2010), 187 (05/27/2009)  Trig:368.0 (02/05/2010), 301.0 (05/27/2009)  Complete Medication List: 1)  Vytorin  10-20 Mg Tabs (Ezetimibe-simvastatin) .... One by mouth two times a day 2)  Imitrex 25 Mg Tabs (Sumatriptan succinate) .... As needed headaches 3)  Nasonex 50 Mcg/act Susp (Mometasone furoate) .... Two sprays q nare q day 4)  Multivitamins Caps (Multiple vitamin) .Marland Kitchen.. 1 once daily 5)  Vita-plus E 400 Unit Caps (Vitamin e) .Marland Kitchen.. 1 once daily 6)  Bisoprolol Fumarate 5 Mg Tabs (Bisoprolol fumarate) .Marland Kitchen.. 1 once daily  Hypertension Assessment/Plan:      The patient's hypertensive risk group is category B: At least one risk factor (excluding diabetes) with no target organ damage.  His calculated 10 year risk of coronary heart disease is 11 %.  Today's blood pressure is 148/90.  His blood pressure goal is < 140/90.  Patient Instructions: 1)  Please schedule a follow-up appointment in 3 months. 2)  Hepatic Panel prior to visit, ICD-9:272.4 3)  Lipid Panel prior to visit, ICD-9:995.20 Prescriptions: VYTORIN 10-20 MG TABS (EZETIMIBE-SIMVASTATIN) one by mouth two times a day  #30 x 11   Entered and Authorized by:   Stacie Glaze MD   Signed by:   Stacie Glaze MD on 02/23/2010   Method used:   Electronically to        [facility] [phone]* (retail)       [address]       [place], [place]  [address]       Ph: [phone]       Fax: [phone]   RxID:   [id number]   Appended Document: Orders Update    Clinical Lists Changes  Orders: Added new Service order of Est. Patient 40-64 years ([id number]) - Signed

## 2010-05-26 NOTE — Assessment & Plan Note (Signed)
Summary: elevated bp at dentist office/bmw   Vital Signs:  Patient profile:   55 year old male Height:      72 inches Weight:      192 pounds BMI:     26.13 Temp:     98.2 degrees F oral Pulse rate:   80 / minute Resp:     14 per minute BP sitting:   180 / 120  (left arm)  Vitals Entered By: Willy Eddy, LPN (December 09, 2009 2:44 PM) CC: at dentis today and bp was taken several times due to elevation it ranged from 183/124 to 200/124, Hypertension Management Is Patient Diabetic? No   CC:  at dentis today and bp was taken several times due to elevation it ranged from 183/124 to 200/124 and Hypertension Management.  History of Present Illness: the pts blood pressure up noted first last week no facial  flushing elevated at denist 190/120 and would not work on the tooth has not prior hx of elevations of blood pressure he has in pain with the ear but not now pts wife is [employer] and carefull explanations of essential HTN was given to both I have spent greater that 30 min face to face evaluating this patient    Hypertension History:      He denies headache, chest pain, palpitations, dyspnea with exertion, orthopnea, PND, peripheral edema, visual symptoms, neurologic problems, syncope, and side effects from treatment.  no tell tail warning signs.        Positive major cardiovascular risk factors include male age 43 years old or older, hyperlipidemia, and hypertension.  Negative major cardiovascular risk factors include no history of diabetes, negative family history for ischemic heart disease, and non-tobacco-user status.        Further assessment for target organ damage reveals no history of ASHD, stroke/TIA, or peripheral vascular disease.     Preventive Screening-Counseling & Management  Alcohol-Tobacco     Smoking Status: never  Problems Prior to Update: 1)  Essential Hypertension  (ICD-401.9) 2)  Elevated Bp Reading Without Dx Hypertension  (ICD-796.2) 3)  Cerumen  Impaction, Bilateral  (ICD-380.4) 4)  Prostate Specific Antigen, Elevated  (ICD-790.93) 5)  Sinusitis, Maxillary, Acute  (ICD-461.0) 6)  Well Adult Exam  (ICD-V70.0) 7)  Ingrown Toenail, Infected  (ICD-703.0) 8)  Hyperlipidemia  (ICD-272.4) 9)  Headache  (ICD-784.0)  Current Problems (verified): 1)  Elevated Bp Reading Without Dx Hypertension  (ICD-796.2) 2)  Cerumen Impaction, Bilateral  (ICD-380.4) 3)  Prostate Specific Antigen, Elevated  (ICD-790.93) 4)  Sinusitis, Maxillary, Acute  (ICD-461.0) 5)  Well Adult Exam  (ICD-V70.0) 6)  Ingrown Toenail, Infected  (ICD-703.0) 7)  Hyperlipidemia  (ICD-272.4) 8)  Headache  (ICD-784.0)  Medications Prior to Update: 1)  Zocor 20 Mg  Tabs (Simvastatin) .... Once Daily 2)  Imitrex 25 Mg  Tabs (Sumatriptan Succinate) .... As Needed Headaches 3)  Nasonex 50 Mcg/act Susp (Mometasone Furoate) .... Two Sprays Q Nare Q Day 4)  Cortisporin 3.5-10000-1 Soln (Neomycin-Polymyxin-Hc) .... 4 Drops B/l Ears Three Times A Day X 7-10 Days  Current Medications (verified): 1)  Zocor 20 Mg  Tabs (Simvastatin) .... Once Daily 2)  Imitrex 25 Mg  Tabs (Sumatriptan Succinate) .... As Needed Headaches 3)  Nasonex 50 Mcg/act Susp (Mometasone Furoate) .... Two Sprays Q Nare Q Day 4)  Bee Pollen 580 Mg Caps (Bee Pollen) .Marland Kitchen.. 1 Once Daily 5)  Multivitamins  Caps (Multiple Vitamin) .Marland Kitchen.. 1 Once Daily 6)  Vita-Plus E 400 Unit Caps (  Vitamin E) .Marland Kitchen.. 1 Once Daily 7)  Bisoprolol Fumarate 5 Mg Tabs (Bisoprolol Fumarate) .... 1/2 Tablet By Mouth Daily For 1-2 Weeks The Increased To 1  A Day If The Blood Pressure Is Greater That 140/90  Allergies (verified): No Known Drug Allergies  Past History:  Family History: Last updated: 12/08/2006 Family History of [place] Family History Hypertension  Social History: Last updated: 12/08/2006 Occupation: Married  Risk Factors: Smoking Status: never (12/09/2009)  Past medical, surgical, family and social histories  (including risk factors) reviewed, and no changes noted (except as noted below).  Past Medical History: Reviewed history from 03/07/2007 and no changes required. Headache/Migraines Sinusitis Hyperlipidemia  Past Surgical History: Reviewed history from 12/08/2006 and no changes required. Colonoscopy  Family History: Reviewed history from 12/08/2006 and no changes required. Family History of [place] Family History Hypertension  Social History: Reviewed history from 12/08/2006 and no changes required. Occupation: Married  Review of Systems  The patient denies anorexia, fever, weight loss, weight gain, vision loss, decreased hearing, hoarseness, chest pain, syncope, dyspnea on exertion, peripheral edema, prolonged cough, headaches, hemoptysis, abdominal pain, melena, hematochezia, severe indigestion/heartburn, hematuria, incontinence, genital sores, muscle weakness, suspicious skin lesions, transient blindness, difficulty walking, depression, unusual weight change, abnormal bleeding, enlarged lymph nodes, angioedema, breast masses, and testicular masses.    Physical Exam  General:  Well-developed,well-nourished,in no acute distress; alert,appropriate and cooperative throughout examination Head:  Normocephalic and atraumatic without obvious abnormalities. No apparent alopecia or balding. Ears:  R ear normal and no external deformities.   Neck:  No deformities, masses, or tenderness noted. Lungs:  Normal respiratory effort, chest expands symmetrically. Lungs are clear to auscultation, no crackles or wheezes. Heart:  Normal rate and regular rhythm. S1 and S2 normal without gallop, murmur, click, rub or other extra sounds. Abdomen:  Bowel sounds positive,abdomen soft and non-tender without masses, organomegaly or hernias noted. Msk:  No deformity or scoliosis noted of thoracic or lumbar spine.   Extremities:  No clubbing, cyanosis, edema, or deformity noted with normal full range of  motion of all joints.   Neurologic:  No cranial nerve deficits noted. Station and gait are normal. Plantar reflexes are down-going bilaterally. DTRs are symmetrical throughout. Sensory, motor and coordinative functions appear intact.   Impression & Recommendations:  Problem # 1:  ESSENTIAL HYPERTENSION (ICD-401.9) Assessment New I have spent greater that 30 min face to face evaluating this patient  the pt has had increased stessors and a hx of minor elevations but essentially normal blood presure begin with r/o RAS and hyperthyroid as well as treat for essential HTN BP today: 180/120 Prior BP: 182/104 (12/03/2009)  10 Yr Risk Heart Disease: 14 % Prior 10 Yr Risk Heart Disease: 9 % (06/03/2009)  Labs Reviewed: K+: 4.3 (11/27/2008) Creat: : 1.0 (11/27/2008)   Chol: 187 (05/27/2009)   HDL: 48.30 (05/27/2009)   LDL: 110 (03/13/2008)   TG: 301.0 (05/27/2009)  Orders: TLB-TSH (Thyroid Stimulating Hormone) (84443-TSH) TLB-BMP (Basic Metabolic Panel-BMET) (80048-METABOL) TLB-T4 (Thyrox), Free (84439-FT4R) TLB-T3, Free (Triiodothyronine) (84481-T3FREE)  His updated medication list for this problem includes:    Bisoprolol Fumarate 5 Mg Tabs (Bisoprolol fumarate) .Marland Kitchen... 1/2 tablet by mouth daily for 1-2 weeks the increased to 1  a day if the blood pressure is greater that 140/90  Problem # 2:  HEADACHE (ICD-784.0) Assessment: Unchanged  His updated medication list for this problem includes:    Imitrex 25 Mg Tabs (Sumatriptan succinate) .Marland Kitchen... As needed headaches    Bisoprolol Fumarate 5 Mg Tabs (  Bisoprolol fumarate) .Marland Kitchen... 1/2 tablet by mouth daily for 1-2 weeks the increased to 1  a day if the blood pressure is greater that 140/90  Headache diary reviewed.  Complete Medication List: 1)  Zocor 20 Mg Tabs (Simvastatin) .... Once daily 2)  Imitrex 25 Mg Tabs (Sumatriptan succinate) .... As needed headaches 3)  Nasonex 50 Mcg/act Susp (Mometasone furoate) .... Two sprays q nare q day 4)   Bee Pollen 580 Mg Caps (Bee pollen) .Marland Kitchen.. 1 once daily 5)  Multivitamins Caps (Multiple vitamin) .Marland Kitchen.. 1 once daily 6)  Vita-plus E 400 Unit Caps (Vitamin e) .Marland Kitchen.. 1 once daily 7)  Bisoprolol Fumarate 5 Mg Tabs (Bisoprolol fumarate) .... 1/2 tablet by mouth daily for 1-2 weeks the increased to 1  a day if the blood pressure is greater that 140/90  Hypertension Assessment/Plan:      The patient's hypertensive risk group is category B: At least one risk factor (excluding diabetes) with no target organ damage.  His calculated 10 year risk of coronary heart disease is 14 %.  Today's blood pressure is 180/120.  His blood pressure goal is < 140/90.  Patient Instructions: 1)  Please schedule a follow-up appointment in 2 weeks. Prescriptions: BISOPROLOL FUMARATE 5 MG TABS (BISOPROLOL FUMARATE) 1/2 tablet by mouth daily for 1-2 weeks the increased to 1  a day if the blood pressure is greater that 140/90  #30 x 0   Entered and Authorized by:   Stacie Glaze MD   Signed by:   Stacie Glaze MD on 12/09/2009   Method used:   [facility] to        [facility] [phone]* (retail)       [address]       [place], [place]  [address]       Ph: [phone]       Fax: [phone]   RxID:   [id number]   Prevention & Chronic Care Immunizations   Influenza vaccine: Historical  (01/03/2009)    Tetanus booster: 04/26/2006: Historical    Pneumococcal vaccine: Not documented  Colorectal Screening   Hemoccult: Not documented    Colonoscopy: normal  (09/12/2006)   Colonoscopy due: 09/2016  Other Screening   PSA: 1.28  (12/27/2008)   Smoking status: never  (12/09/2009)  Lipids   Total Cholesterol: 187  (05/27/2009)   LDL: 110  (03/13/2008)   LDL Direct: 84.1  (05/27/2009)   HDL: 48.30  (05/27/2009)   Triglycerides: 301.0  (05/27/2009)    SGOT (AST): 28  (05/27/2009)   SGPT (ALT): 30  (05/27/2009)   Alkaline phosphatase: 54  (05/27/2009)   Total bilirubin:  1.2  (05/27/2009)  Hypertension   Last Blood Pressure: 180 / 120  (12/09/2009)   Serum creatinine: 1.0  (11/27/2008)   Serum potassium 4.3  (11/27/2008)    Hypertension flowsheet reviewed?: Yes  Self-Management Support :    Hypertension self-management support: Not documented    Lipid self-management support: Not documented

## 2010-06-03 NOTE — Assessment & Plan Note (Signed)
Summary: 3 month rov/njr   Vital Signs:  Patient profile:   55 year old male Height:      72 inches Weight:      190 pounds BMI:     25.86 Temp:     98.2 degrees F oral Pulse rate:   76 / minute Resp:     14 per minute BP sitting:   138 / 88  (left arm)  Vitals Entered By: Willy Eddy, LPN (May 25, 2010 10:12 AM) CC: roa labs, Hypertension Management, Lipid Management Is Patient Diabetic? No   Primary Care Provider:  Stacie Glaze MD  CC:  roa labs, Hypertension Management, and Lipid Management.  History of Present Illness:  patient is a 55 year old white male who presents for followup of hypertension hyperlipidemia he had a lipid profile drawn 23 January and we are reviewing the results with him including a liver and a direct LDL. Tjhe pt has increased 40-60 min of exercize daily Pulse and blood pressure ahs responded.  Hypertension History:      He denies headache, chest pain, palpitations, dyspnea with exertion, orthopnea, PND, peripheral edema, visual symptoms, neurologic problems, syncope, and side effects from treatment.        Positive major cardiovascular risk factors include male age 69 years old or older, hyperlipidemia, and hypertension.  Negative major cardiovascular risk factors include no history of diabetes, negative family history for ischemic heart disease, and non-tobacco-user status.        Further assessment for target organ damage reveals no history of ASHD, stroke/TIA, or peripheral vascular disease.    Lipid Management History:      Positive NCEP/ATP III risk factors include male age 40 years old or older and hypertension.  Negative NCEP/ATP III risk factors include non-diabetic, no family history for ischemic heart disease, non-tobacco-user status, no ASHD (atherosclerotic heart disease), no prior stroke/TIA, no peripheral vascular disease, and no history of aortic aneurysm.      Preventive Screening-Counseling & Management  Alcohol-Tobacco    Smoking Status: never     Tobacco Counseling: to remain off tobacco products  Problems Prior to Update: 1)  Essential Hypertension  (ICD-401.9) 2)  Elevated Bp Reading Without Dx Hypertension  (ICD-796.2) 3)  Cerumen Impaction, Bilateral  (ICD-380.4) 4)  Prostate Specific Antigen, Elevated  (ICD-790.93) 5)  Sinusitis, Maxillary, Acute  (ICD-461.0) 6)  Well Adult Exam  (ICD-V70.0) 7)  Ingrown Toenail, Infected  (ICD-703.0) 8)  Hyperlipidemia  (ICD-272.4) 9)  Headache  (ICD-784.0)  Current Problems (verified): 1)  Essential Hypertension  (ICD-401.9) 2)  Elevated Bp Reading Without Dx Hypertension  (ICD-796.2) 3)  Cerumen Impaction, Bilateral  (ICD-380.4) 4)  Prostate Specific Antigen, Elevated  (ICD-790.93) 5)  Sinusitis, Maxillary, Acute  (ICD-461.0) 6)  Well Adult Exam  (ICD-V70.0) 7)  Ingrown Toenail, Infected  (ICD-703.0) 8)  Hyperlipidemia  (ICD-272.4) 9)  Headache  (ICD-784.0)  Medications Prior to Update: 1)  Vytorin 10-20 Mg Tabs (Ezetimibe-Simvastatin) .... One By Mouth Two Times A Day 2)  Imitrex 25 Mg  Tabs (Sumatriptan Succinate) .... As Needed Headaches 3)  Nasonex 50 Mcg/act Susp (Mometasone Furoate) .... Two Sprays Q Nare Q Day 4)  Multivitamins  Caps (Multiple Vitamin) .Marland Kitchen.. 1 Once Daily 5)  Vita-Plus E 400 Unit Caps (Vitamin E) .Marland Kitchen.. 1 Once Daily 6)  Bisoprolol Fumarate 5 Mg Tabs (Bisoprolol Fumarate) .Marland Kitchen.. 1 Once Daily  Current Medications (verified): 1)  Vytorin 10-20 Mg Tabs (Ezetimibe-Simvastatin) .... One By Mouth Two Times A Day 2)  Imitrex 25 Mg  Tabs (Sumatriptan Succinate) .... As Needed Headaches 3)  Nasonex 50 Mcg/act Susp (Mometasone Furoate) .... Two Sprays Q Nare Q Day 4)  Multivitamins  Caps (Multiple Vitamin) .Marland Kitchen.. 1 Once Daily 5)  Vita-Plus E 400 Unit Caps (Vitamin E) .Marland Kitchen.. 1 Once Daily 6)  Bisoprolol Fumarate 5 Mg Tabs (Bisoprolol Fumarate) .Marland Kitchen.. 1 Once Daily  Allergies (verified): No Known Drug Allergies  Past History:  Family History: Last  updated: 12/08/2006 Family History of [place] Family History Hypertension  Social History: Last updated: 12/08/2006 Occupation: Married  Risk Factors: Smoking Status: never (05/25/2010)  Past medical, surgical, family and social histories (including risk factors) reviewed, and no changes noted (except as noted below).  Past Medical History: Reviewed history from 03/07/2007 and no changes required. Headache/Migraines Sinusitis Hyperlipidemia  Past Surgical History: Reviewed history from 12/08/2006 and no changes required. Colonoscopy  Family History: Reviewed history from 12/08/2006 and no changes required. Family History of [place] Family History Hypertension  Social History: Reviewed history from 12/08/2006 and no changes required. Occupation: Married  Review of Systems  The patient denies anorexia, fever, weight loss, weight gain, vision loss, decreased hearing, hoarseness, chest pain, syncope, dyspnea on exertion, peripheral edema, prolonged cough, headaches, hemoptysis, abdominal pain, melena, hematochezia, severe indigestion/heartburn, hematuria, incontinence, genital sores, muscle weakness, suspicious skin lesions, transient blindness, difficulty walking, depression, unusual weight change, abnormal bleeding, enlarged lymph nodes, angioedema, and breast masses.    Physical Exam  General:  Well-developed,well-nourished,in no acute distress; alert,appropriate and cooperative throughout examination Head:  Normocephalic and atraumatic without obvious abnormalities. No apparent alopecia or balding. Ears:  R ear normal and no external deformities.   Nose:  External nasal examination shows no deformity or inflammation. Nasal mucosa are pink  Mouth:  Oral mucosa and oropharynx without lesions or exudates Neck:  No deformities, masses, or tenderness noted. Lungs:  Normal respiratory effort, chest expands symmetrically. Lungs are clear to auscultation, no crackles or  wheezes. Heart:  Normal rate and regular rhythm. S1 and S2 normal without gallop, murmur, click, rub or other extra sounds. Abdomen:  Bowel sounds positive,abdomen soft and non-tender without masses, organomegaly or hernias noted.   Impression & Recommendations:  Problem # 1:  ESSENTIAL HYPERTENSION (ICD-401.9) Assessment Improved  the patient has increased anxiety with doctors office visits and his blood pressure is about 10-15 points higher in the doctor's office than it is at home. His updated medication list for this problem includes:    Bisoprolol Fumarate 5 Mg Tabs (Bisoprolol fumarate) .Marland Kitchen... 1 once daily  BP today: 138/88 Prior BP: 148/90 (02/23/2010)  Prior 10 Yr Risk Heart Disease: 11 % (02/23/2010)  Labs Reviewed: K+: 4.9 (02/05/2010) Creat: : 1.0 (02/05/2010)   Chol: 137 (05/18/2010)   HDL: 43.90 (05/18/2010)   LDL: 110 (03/13/2008)   TG: 203.0 (05/18/2010)  Problem # 2:  HYPERLIPIDEMIA (ICD-272.4) Assessment: Improved  His updated medication list for this problem includes:    Vytorin 10-20 Mg Tabs (Ezetimibe-simvastatin) ..... One by mouth two times a day  Labs Reviewed: SGOT: 32 (05/18/2010)   SGPT: 44 (05/18/2010)  Lipid Goals: Chol Goal: 200 (03/07/2007)   HDL Goal: 40 (03/07/2007)   LDL Goal: 160 (03/18/2008)   TG Goal: 150 (03/07/2007)  Prior 10 Yr Risk Heart Disease: 11 % (02/23/2010)   HDL:43.90 (05/18/2010), 46.00 (02/05/2010)  LDL:110 (03/13/2008), DEL (07/25/2007)  Chol:137 (05/18/2010), 190 (02/05/2010)  Trig:203.0 (05/18/2010), 368.0 (02/05/2010)  Problem # 3:  HYPERLIPIDEMIA (ICD-272.4)  His updated medication list for this problem includes:  Vytorin 10-20 Mg Tabs (Ezetimibe-simvastatin) ..... One by mouth two times a day  Complete Medication List: 1)  Vytorin 10-20 Mg Tabs (Ezetimibe-simvastatin) .... One by mouth two times a day 2)  Imitrex 25 Mg Tabs (Sumatriptan succinate) .... As needed headaches 3)  Nasonex 50 Mcg/act Susp (Mometasone  furoate) .... Two sprays q nare q day 4)  Multivitamins Caps (Multiple vitamin) .Marland Kitchen.. 1 once daily 5)  Vita-plus E 400 Unit Caps (Vitamin e) .Marland Kitchen.. 1 once daily 6)  Bisoprolol Fumarate 5 Mg Tabs (Bisoprolol fumarate) .Marland Kitchen.. 1 once daily  Hypertension Assessment/Plan:      The patient's hypertensive risk group is category B: At least one risk factor (excluding diabetes) with no target organ damage.  His calculated 10 year risk of coronary heart disease is 14 %.  Today's blood pressure is 138/88.  His blood pressure goal is < 140/90.  Lipid Assessment/Plan:      Based on NCEP/ATP III, the patient's risk factor category is "2 or more risk factors and a calculated 10 year CAD risk of < 20%".  The patient's lipid goals are as follows: Total cholesterol goal is 200; LDL cholesterol goal is 160; HDL cholesterol goal is 40; Triglyceride goal is 150.  His LDL cholesterol goal has been met.    Patient Instructions: 1)    sept 2012 CPX   Orders Added: 1)  Est. Patient Level IV [04540]

## 2010-09-14 ENCOUNTER — Ambulatory Visit (INDEPENDENT_AMBULATORY_CARE_PROVIDER_SITE_OTHER): Payer: BC Managed Care – PPO | Admitting: Internal Medicine

## 2010-09-14 ENCOUNTER — Encounter: Payer: Self-pay | Admitting: Internal Medicine

## 2010-09-14 VITALS — BP 156/94 | HR 76 | Temp 98.2°F | Resp 16 | Ht 72.0 in | Wt 188.0 lb

## 2010-09-14 DIAGNOSIS — I10 Essential (primary) hypertension: Secondary | ICD-10-CM

## 2010-09-14 MED ORDER — EZETIMIBE-SIMVASTATIN 10-20 MG PO TABS: 1.0000 | ORAL_TABLET | Freq: Every day | ORAL | Status: DC

## 2010-09-14 MED ORDER — BISOPROLOL FUMARATE 5 MG PO TABS: 5.0000 mg | ORAL_TABLET | Freq: Every day | ORAL | Status: DC

## 2010-09-14 NOTE — Progress Notes (Signed)
  Subjective:    Patient ID: Frank Joseph, male    DOB: 03/24/1956, 55 y.o.   MRN: [id number]  HPI  After a 4 day golf trip noted increased blood, poor diet, used tums for golf trip. The guys on the trip had a hx of MI and he "went mental" went he heard the story and found out that his blood pressure was elevated. Was also taking aleve. Fried food, hot dogs. Beer. He has a history of hypertension hyperlipidemia and anxiety.    Review of Systems  Constitutional: Negative for fever and fatigue.  HENT: Negative for hearing loss, congestion, neck pain and postnasal drip.   Eyes: Negative for discharge, redness and visual disturbance.  Respiratory: Negative for cough, shortness of breath and wheezing.   Cardiovascular: Negative for leg swelling.  Gastrointestinal: Negative for abdominal pain, constipation and abdominal distention.  Genitourinary: Negative for urgency and frequency.  Musculoskeletal: Negative for joint swelling and arthralgias.  Skin: Negative for color change and rash.  Neurological: Negative for weakness and light-headedness.  Hematological: Negative for adenopathy.  Psychiatric/Behavioral: Positive for agitation. Negative for behavioral problems. The patient is nervous/anxious.    Past Medical History  Diagnosis Date  . Headache   . Sinusitis   . Hyperlipidemia   . Hypertension    Past Surgical History  Procedure Date  . Colonscopy     reports that he has never smoked. He does not have any smokeless tobacco history on file. He reports that he drinks alcohol. He reports that he does not use illicit drugs. family history includes Glaucoma in an unspecified family member and Hypertension in an unspecified family member. No Known Allergies     Objective:   Physical Exam  Constitutional: He is oriented to person, place, and time. He appears well-developed and well-nourished.  HENT:  Head: Normocephalic and atraumatic.  Eyes: Conjunctivae are normal. Pupils  are equal, round, and reactive to light.  Neck: Normal range of motion. Neck supple.  Cardiovascular: Normal rate and regular rhythm.   Pulmonary/Chest: Effort normal and breath sounds normal.  Abdominal: Soft. Bowel sounds are normal.  Neurological: He is alert and oriented to person, place, and time.  Skin: Skin is warm and dry.  Psychiatric: He has a normal mood and affect. His behavior is normal. Thought content normal.          Assessment & Plan:  Patient with a history of essential hypertension with a hypertensive flare due to to a combination of factors.  He had dietary indiscretion with excessive beer use, greasy and salty diet and increased anxiety.  We discussed control of his anxiety reassured him that this is a reversible problem but do feel that his beta blocker should be increased.  In the past he resisted increase his blood pressure medication but he wanted to try diet and exercise as his primary intervention for his blood pressure his apparent episodes like this will require him to have better blood pressure control universally.  We spent 30 minutes discussing this with the patient and his wife

## 2010-09-14 NOTE — Patient Instructions (Signed)
For the next 2 days take 1 whole  blood pressure pills..... 1/2  in the morning and one half at bed time Monitor your blood pressure if you do not reach goal after 2 days then begin 1 whole tablet at bedtime

## 2010-09-24 ENCOUNTER — Encounter: Payer: Self-pay | Admitting: Internal Medicine

## 2010-10-16 ENCOUNTER — Ambulatory Visit (INDEPENDENT_AMBULATORY_CARE_PROVIDER_SITE_OTHER): Payer: BC Managed Care – PPO | Admitting: Internal Medicine

## 2010-10-16 VITALS — BP 146/80 | HR 68 | Temp 98.2°F | Resp 16 | Ht 73.0 in | Wt 188.0 lb

## 2010-10-16 DIAGNOSIS — I10 Essential (primary) hypertension: Secondary | ICD-10-CM

## 2010-10-16 MED ORDER — AZITHROMYCIN 250 MG PO TABS: ORAL_TABLET | ORAL | Status: AC

## 2011-02-12 ENCOUNTER — Other Ambulatory Visit: Payer: Self-pay

## 2011-02-19 ENCOUNTER — Encounter: Payer: Self-pay | Admitting: Internal Medicine

## 2011-04-07 ENCOUNTER — Other Ambulatory Visit (INDEPENDENT_AMBULATORY_CARE_PROVIDER_SITE_OTHER): Payer: BC Managed Care – PPO

## 2011-04-07 DIAGNOSIS — Z0000 Encounter for general adult medical examination without abnormal findings: Secondary | ICD-10-CM

## 2011-04-07 LAB — CBC WITH DIFFERENTIAL/PLATELET
Basophils Relative: 0.5 % (ref 0.0–3.0)
Eosinophils Absolute: 0.1 10*3/uL (ref 0.0–0.7)
Lymphocytes Relative: 27.4 % (ref 12.0–46.0)
MCHC: 34.3 g/dL (ref 30.0–36.0)
MCV: 94.7 fl (ref 78.0–100.0)
Monocytes Absolute: 0.5 10*3/uL (ref 0.1–1.0)
Neutrophils Relative %: 61 % (ref 43.0–77.0)
Platelets: 118 10*3/uL — ABNORMAL LOW (ref 150.0–400.0)
RBC: 4.78 Mil/uL (ref 4.22–5.81)
WBC: 4.6 10*3/uL (ref 4.5–10.5)

## 2011-04-07 LAB — LIPID PANEL
HDL: 42.8 mg/dL (ref >39.00)
LDL Cholesterol: 57 mg/dL (ref 0–99)
Total CHOL/HDL Ratio: 3
Triglycerides: 190 mg/dL — ABNORMAL HIGH (ref 0.0–149.0)
VLDL: 38 mg/dL (ref 0.0–40.0)

## 2011-04-07 LAB — HEPATIC FUNCTION PANEL
Alkaline Phosphatase: 57 U/L (ref 39–117)
Bilirubin, Direct: 0.2 mg/dL (ref 0.0–0.3)
Total Bilirubin: 1.1 mg/dL (ref 0.3–1.2)
Total Protein: 6.8 g/dL (ref 6.0–8.3)

## 2011-04-07 LAB — PSA: PSA: 0.97 ng/mL (ref 0.10–4.00)

## 2011-04-07 LAB — POCT URINALYSIS DIPSTICK

## 2011-04-07 LAB — BASIC METABOLIC PANEL
BUN: 20 mg/dL (ref 6–23)
CO2: 30 mEq/L (ref 19–32)
Calcium: 9.3 mg/dL (ref 8.4–10.5)
Creatinine, Ser: 1.1 mg/dL (ref 0.4–1.5)

## 2011-04-07 LAB — TSH: TSH: 2.78 u[IU]/mL (ref 0.35–5.50)

## 2011-04-14 ENCOUNTER — Encounter: Payer: Self-pay | Admitting: Internal Medicine

## 2011-04-23 ENCOUNTER — Other Ambulatory Visit: Payer: Self-pay | Admitting: Internal Medicine

## 2011-04-23 ENCOUNTER — Ambulatory Visit (INDEPENDENT_AMBULATORY_CARE_PROVIDER_SITE_OTHER): Payer: BC Managed Care – PPO | Admitting: Internal Medicine

## 2011-04-23 ENCOUNTER — Encounter: Payer: Self-pay | Admitting: Internal Medicine

## 2011-04-23 VITALS — BP 140/80 | HR 72 | Temp 98.2°F | Resp 16 | Ht 73.0 in | Wt 194.0 lb

## 2011-04-23 DIAGNOSIS — Z0000 Encounter for general adult medical examination without abnormal findings: Secondary | ICD-10-CM

## 2011-04-23 DIAGNOSIS — R030 Elevated blood-pressure reading, without diagnosis of hypertension: Secondary | ICD-10-CM

## 2011-04-23 DIAGNOSIS — Z23 Encounter for immunization: Secondary | ICD-10-CM

## 2011-04-23 NOTE — Progress Notes (Signed)
Subjective:    Patient ID: Frank Joseph, male    DOB: 02/18/1956, 55 y.o.   MRN: [id number]  HPI As presents for his yearly physical examination.  Reviewed blood work which was excellent he brings with him a record of his blood pressure over the past several months on average his blood pressure appears to be well controlled with most blood pressure readings between 110 and 130.  It appears from review that the average blood pressure would be about 125/80 he is exercising regularly and checks his blood pressure both after exercise and without exercise and those blood pressure averages are stable today his blood pressure is elevated he does have a sinus headache which may contribute to some elevation of his blood pressure we will we'll recheck his blood pressure today    Review of Systems  Constitutional: Negative for fever and fatigue.  HENT: Negative for hearing loss, congestion, neck pain and postnasal drip.   Eyes: Negative for discharge, redness and visual disturbance.  Respiratory: Negative for cough, shortness of breath and wheezing.   Cardiovascular: Negative for leg swelling.  Gastrointestinal: Negative for abdominal pain, constipation and abdominal distention.  Genitourinary: Negative for urgency and frequency.  Musculoskeletal: Negative for joint swelling and arthralgias.  Skin: Negative for color change and rash.  Neurological: Negative for weakness and light-headedness.  Hematological: Negative for adenopathy.  Psychiatric/Behavioral: Negative for behavioral problems.   Past Medical History  Diagnosis Date  . Headache   . Sinusitis   . Hyperlipidemia   . Hypertension     History   Social History  . Marital Status: Married    Spouse Name: N/A    Number of Children: N/A  . Years of Education: N/A   Occupational History  . Not on file.   Social History Main Topics  . Smoking status: Never Smoker   . Smokeless tobacco: Not on file  . Alcohol Use: Yes  .  Drug Use: No  . Sexually Active: Not on file   Other Topics Concern  . Not on file   Social History Narrative  . No narrative on file    Past Surgical History  Procedure Date  . Colonscopy     Family History  Problem Relation Age of Onset  . Hypertension    . Glaucoma      No Known Allergies  Current Outpatient Prescriptions on File Prior to Visit  Medication Sig Dispense Refill  . bisoprolol (ZEBETA) 5 MG tablet Take 1 tablet (5 mg total) by mouth daily.  30 tablet  11  . ezetimibe-simvastatin (VYTORIN) 10-20 MG per tablet Take 1 tablet by mouth at bedtime.  30 tablet  11  . mometasone (NASONEX) 50 MCG/ACT nasal spray 2 sprays by Nasal route daily.        . multivitamin (THERAGRAN) per tablet Take 1 tablet by mouth daily.        . SUMAtriptan (IMITREX) 25 MG tablet Take 25 mg by mouth every 2 (two) hours as needed.          BP 160/100  Pulse 72  Temp 98.2 F (36.8 C)  Resp 16  Ht 6\' 1"  (1.854 m)  Wt 194 lb (87.998 kg)  BMI 25.60 kg/m2       Objective:   Physical Exam  Nursing note and vitals reviewed. Constitutional: He is oriented to person, place, and time. He appears well-developed and well-nourished.  HENT:  Head: Normocephalic and atraumatic.  Eyes: Conjunctivae are normal. Pupils are equal,  round, and reactive to light.  Neck: Normal range of motion. Neck supple.  Cardiovascular: Normal rate and regular rhythm.   Pulmonary/Chest: Effort normal and breath sounds normal.  Abdominal: Soft. Bowel sounds are normal.  Genitourinary: Rectum normal and prostate normal.  Musculoskeletal: Normal range of motion.  Neurological: He is alert and oriented to person, place, and time.  Skin: Skin is warm and dry.  Psychiatric: He has a normal mood and affect. His behavior is normal.          Assessment & Plan:   Patient presents for yearly preventative medicine examination.   all immunizations and health maintenance protocols were reviewed with the patient  and they are up to date with these protocols.   screening laboratory values were reviewed with the patient including screening of hyperlipidemia PSA renal function and hepatic function.   There medications past medical history social history problem list and allergies were reviewed in detail.   Goals were established with regard to weight loss exercise diet in compliance with medications

## 2011-04-23 NOTE — Patient Instructions (Signed)
The patient is instructed to continue all medications as prescribed. Schedule followup with check out clerk upon leaving the clinic  

## 2011-06-02 NOTE — Progress Notes (Signed)
  Subjective:    Patient ID: Frank Joseph, male    DOB: 1956/01/24, 56 y.o.   MRN: [id number]  HPI    Review of Systems     Objective:   Physical Exam        Assessment & Plan:

## 2011-08-23 ENCOUNTER — Encounter: Payer: Self-pay | Admitting: Internal Medicine

## 2011-08-23 ENCOUNTER — Ambulatory Visit (INDEPENDENT_AMBULATORY_CARE_PROVIDER_SITE_OTHER): Payer: BC Managed Care – PPO | Admitting: Internal Medicine

## 2011-08-23 VITALS — BP 138/80 | HR 76 | Temp 98.6°F | Resp 16 | Ht 72.0 in | Wt 194.0 lb

## 2011-08-23 DIAGNOSIS — E785 Hyperlipidemia, unspecified: Secondary | ICD-10-CM

## 2011-08-23 DIAGNOSIS — R030 Elevated blood-pressure reading, without diagnosis of hypertension: Secondary | ICD-10-CM

## 2011-08-23 DIAGNOSIS — F411 Generalized anxiety disorder: Secondary | ICD-10-CM

## 2011-08-23 DIAGNOSIS — I10 Essential (primary) hypertension: Secondary | ICD-10-CM

## 2011-08-23 NOTE — Patient Instructions (Signed)
The patient is instructed to continue all medications as prescribed. Schedule followup with check out clerk upon leaving the clinic  

## 2011-08-23 NOTE — Progress Notes (Signed)
Subjective:    Patient ID: Frank Joseph, male    DOB: 1955-04-29, 56 y.o.   MRN: [id number]  HPI Follow up of blood pressure The patient in on  vytorin for lipids He added a snack in AM and mid afternoon. He cut out the bacon and the eggs in the AM He has been following a healthy lifestyle Weight has been well controlled   Detailed home report of blood pressure and weight    Review of Systems  Constitutional: Negative for fever and fatigue.  HENT: Negative for hearing loss, congestion, neck pain and postnasal drip.   Eyes: Negative for discharge, redness and visual disturbance.  Respiratory: Negative for cough, shortness of breath and wheezing.   Cardiovascular: Negative for leg swelling.  Gastrointestinal: Negative for abdominal pain, constipation and abdominal distention.  Genitourinary: Negative for urgency and frequency.  Musculoskeletal: Negative for joint swelling and arthralgias.  Skin: Negative for color change and rash.  Neurological: Negative for weakness and light-headedness.  Hematological: Negative for adenopathy.  Psychiatric/Behavioral: Negative for behavioral problems.   Past Medical History  Diagnosis Date  . Headache   . Sinusitis   . Hyperlipidemia   . Hypertension     History   Social History  . Marital Status: Married    Spouse Name: N/A    Number of Children: N/A  . Years of Education: N/A   Occupational History  . Not on file.   Social History Main Topics  . Smoking status: Never Smoker   . Smokeless tobacco: Not on file  . Alcohol Use: Yes  . Drug Use: No  . Sexually Active: Not on file   Other Topics Concern  . Not on file   Social History Narrative  . No narrative on file    Past Surgical History  Procedure Date  . Colonscopy     Family History  Problem Relation Age of Onset  . Hypertension    . Glaucoma      No Known Allergies  Current Outpatient Prescriptions on File Prior to Visit  Medication Sig Dispense  Refill  . bisoprolol (ZEBETA) 5 MG tablet Take 1 tablet (5 mg total) by mouth daily.  30 tablet  11  . ezetimibe-simvastatin (VYTORIN) 10-20 MG per tablet Take 1 tablet by mouth at bedtime.  30 tablet  11  . multivitamin (THERAGRAN) per tablet Take 1 tablet by mouth daily.        Marland Kitchen NASONEX 50 MCG/ACT nasal spray INHALE 2 SPRAYS EACH NOSTRIL EVERY DAY  17 Inhaler  11  . SUMAtriptan (IMITREX) 25 MG tablet Take 25 mg by mouth every 2 (two) hours as needed.          BP 138/80  Pulse 76  Temp 98.6 F (37 C)  Resp 16  Ht 6' (1.829 m)  Wt 194 lb (87.998 kg)  BMI 26.31 kg/m2       Objective:   Physical Exam  Nursing note and vitals reviewed. Constitutional: He appears well-developed and well-nourished.  HENT:  Head: Normocephalic and atraumatic.  Eyes: Conjunctivae are normal. Pupils are equal, round, and reactive to light.  Neck: Normal range of motion. Neck supple.  Cardiovascular: Normal rate and regular rhythm.   Pulmonary/Chest: Effort normal and breath sounds normal.  Abdominal: Soft. Bowel sounds are normal.          Assessment & Plan:  Blood pressure stable Monitoring of the lipids and liver was stable at goal Migraine HA pattenr stable reviewed diet and  exercise pointing out need to cardio Stable pattern

## 2011-09-09 ENCOUNTER — Other Ambulatory Visit: Payer: Self-pay | Admitting: Internal Medicine

## 2011-10-08 ENCOUNTER — Other Ambulatory Visit: Payer: Self-pay | Admitting: Internal Medicine

## 2011-10-18 ENCOUNTER — Other Ambulatory Visit: Payer: Self-pay | Admitting: *Deleted

## 2011-10-18 MED ORDER — EZETIMIBE-SIMVASTATIN 10-20 MG PO TABS: 1.0000 | ORAL_TABLET | Freq: Every day | ORAL | Status: DC

## 2011-10-22 ENCOUNTER — Other Ambulatory Visit: Payer: Self-pay | Admitting: *Deleted

## 2011-10-22 MED ORDER — EZETIMIBE-SIMVASTATIN 10-20 MG PO TABS: 1.0000 | ORAL_TABLET | Freq: Every day | ORAL | Status: DC

## 2012-04-12 ENCOUNTER — Other Ambulatory Visit (INDEPENDENT_AMBULATORY_CARE_PROVIDER_SITE_OTHER): Payer: BC Managed Care – PPO

## 2012-04-12 DIAGNOSIS — Z0000 Encounter for general adult medical examination without abnormal findings: Secondary | ICD-10-CM

## 2012-04-12 LAB — CBC WITH DIFFERENTIAL/PLATELET
Basophils Relative: 0.5 % (ref 0.0–3.0)
Eosinophils Relative: 2.3 % (ref 0.0–5.0)
HCT: 46.3 % (ref 39.0–52.0)
Lymphs Abs: 1.1 10*3/uL (ref 0.7–4.0)
MCV: 92.4 fl (ref 78.0–100.0)
Monocytes Absolute: 0.5 10*3/uL (ref 0.1–1.0)
Monocytes Relative: 10.7 % (ref 3.0–12.0)
Neutrophils Relative %: 61.3 % (ref 43.0–77.0)
RBC: 5.01 Mil/uL (ref 4.22–5.81)
WBC: 4.5 10*3/uL (ref 4.5–10.5)

## 2012-04-12 LAB — BASIC METABOLIC PANEL
Chloride: 104 mEq/L (ref 96–112)
Potassium: 4.7 mEq/L (ref 3.5–5.1)

## 2012-04-12 LAB — LIPID PANEL
HDL: 43.5 mg/dL (ref >39.00)
LDL Cholesterol: 53 mg/dL (ref 0–99)
Total CHOL/HDL Ratio: 3
VLDL: 30.6 mg/dL (ref 0.0–40.0)

## 2012-04-12 LAB — POCT URINALYSIS DIPSTICK
Bilirubin, UA: NEGATIVE
Blood, UA: NEGATIVE
Ketones, UA: NEGATIVE
Nitrite, UA: NEGATIVE
Spec Grav, UA: 1.025
pH, UA: 5

## 2012-04-12 LAB — HEPATIC FUNCTION PANEL
ALT: 39 U/L (ref 0–53)
AST: 30 U/L (ref 0–37)
Bilirubin, Direct: 0.2 mg/dL (ref 0.0–0.3)
Total Protein: 7.1 g/dL (ref 6.0–8.3)

## 2012-04-12 LAB — PSA: PSA: 0.75 ng/mL (ref 0.10–4.00)

## 2012-04-24 ENCOUNTER — Encounter: Payer: BC Managed Care – PPO | Admitting: Internal Medicine

## 2012-05-15 ENCOUNTER — Other Ambulatory Visit: Payer: Self-pay | Admitting: Internal Medicine

## 2012-05-25 ENCOUNTER — Telehealth: Payer: Self-pay | Admitting: Internal Medicine

## 2012-05-25 NOTE — Telephone Encounter (Signed)
Patient Information:  Caller Name: Yash Cacciola  Phone: [phone]  Patient: Frank Joseph, Frank Joseph  Gender: Male  DOB: 14-Jan-1956  Age: 57 Years  PCP: Darryll Capers (Adults only)  Office Follow Up:  Does the office need to follow up with this patient?: No  Instructions For The Office: N/A   Symptoms  Reason For Call & Symptoms: Wife states she believes her husband has the flu.  Onset of symptoms last night .  +Diarrhea, Liquid every hour. Abdominal cramping   +coughing non productive, no emesis, 99.6 (o). +bagel ,water, ginger ale.  Reviewed Health History In EMR: Yes  Reviewed Medications In EMR: Yes  Reviewed Allergies In EMR: Yes  Reviewed Surgeries / Procedures: No  Date of Onset of Symptoms: 05/24/2012  Treatments Tried: Tylenol  Treatments Tried Worked: No  Any Fever: Yes  Fever Taken: Oral  Fever Time Of Reading: 16:15:00  Fever Last Reading: 99.6  Guideline(s) Used:  Diarrhea  Disposition Per Guideline:   Home Care  Reason For Disposition Reached:   Mild diarrhea  Advice Given:  Reassurance:  In healthy adults, new-onset diarrhea is usually caused by a viral infection of the intestines, which you can treat at home. Diarrhea is the body's way of getting rid of the infection. Here are some tips on how to keep ahead of the fluid losses.  Here is some care advice that should help.  Fluids:  Drink more fluids, at least 8-10 glasses (8 oz or 240 ml) daily.  For example: sports drinks, diluted fruit juices, soft drinks.  Nutrition:  Maintaining some food intake during episodes of diarrhea is important.  Ideal initial foods include boiled starches/cereals (e.g., potatoes, rice, noodles, wheat, oats) with a small amount of salt to taste.  Other acceptable foods include: bananas, yogurt, crackers, soup.  As your stools return to normal consistency, resume a normal diet.  Diarrhea Medication - Bismuth Subsalicylate (e.g., Kaopectate, PeptoBismol):  Helps reduce  diarrhea, vomiting, and abdominal cramping.  Do not use for more than 2 days  This medication can make the stools look dark or even black (but not red or tarry).  Diarrhea Medication  - Imodium AD:   Helps reduce diarrhea.  Adult dosage: 4 mg (2 capsules or 4 teaspoons or 20 ml) is the recommended first dose. You may take an additional 2 mg (1 capsule or 2 teaspoons or 10 ml) after each loose BM.  Maximum dosage: 16 mg (8 capsules or 16 teaspoons or 80 ml).  1 capsule = 2 mg; 1 teaspoon (5 ml) = 1 mg.  Caution: Do not use if you have a fever greater than 100F (37.8C). Do not use if there is blood or mucus in your stools. Do not use for more than 2 days.  Read all package instructions.  Expected Course:  Viral diarrhea lasts 4-7 days. Always worse on days 1 and 2.  Call Back If:  Signs of dehydration occur (e.g., no urine for more than 12 hours, very dry mouth, lightheaded, etc.)  Diarrhea lasts over 7 days  You become worse.

## 2012-06-16 ENCOUNTER — Encounter: Payer: Self-pay | Admitting: Internal Medicine

## 2012-06-16 ENCOUNTER — Ambulatory Visit (INDEPENDENT_AMBULATORY_CARE_PROVIDER_SITE_OTHER): Payer: BC Managed Care – PPO | Admitting: Internal Medicine

## 2012-06-16 VITALS — BP 150/80 | HR 80 | Temp 98.3°F | Resp 16 | Ht 72.0 in | Wt 194.0 lb

## 2012-06-16 DIAGNOSIS — I10 Essential (primary) hypertension: Secondary | ICD-10-CM

## 2012-06-16 DIAGNOSIS — E785 Hyperlipidemia, unspecified: Secondary | ICD-10-CM

## 2012-06-16 DIAGNOSIS — Z0000 Encounter for general adult medical examination without abnormal findings: Secondary | ICD-10-CM

## 2012-06-16 NOTE — Progress Notes (Signed)
Subjective:    Patient ID: Frank Joseph, male    DOB: [birth year], 57 y.o.   MRN: [id number]  HPI CPX   Review of Systems  Constitutional: Negative for fever and fatigue.  HENT: Negative for hearing loss, congestion, neck pain and postnasal drip.   Eyes: Negative for discharge, redness and visual disturbance.  Respiratory: Negative for cough, shortness of breath and wheezing.   Cardiovascular: Negative for leg swelling.  Gastrointestinal: Negative for abdominal pain, constipation and abdominal distention.  Genitourinary: Negative for urgency and frequency.  Musculoskeletal: Negative for joint swelling and arthralgias.  Skin: Negative for color change and rash.  Neurological: Negative for weakness and light-headedness.  Hematological: Negative for adenopathy.  Psychiatric/Behavioral: Negative for behavioral problems.   Past Medical History  Diagnosis Date  . Headache   . Sinusitis   . Hyperlipidemia   . Hypertension     History   Social History  . Marital Status: Married    Spouse Name: N/A    Number of Children: N/A  . Years of Education: N/A   Occupational History  . Not on file.   Social History Main Topics  . Smoking status: Never Smoker   . Smokeless tobacco: Not on file  . Alcohol Use: Yes  . Drug Use: No  . Sexually Active: Not on file   Other Topics Concern  . Not on file   Social History Narrative  . No narrative on file    Past Surgical History  Procedure Laterality Date  . Colonscopy      Family History  Problem Relation Age of Onset  . Hypertension    . Glaucoma      No Known Allergies  Current Outpatient Prescriptions on File Prior to Visit  Medication Sig Dispense Refill  . bisoprolol (ZEBETA) 5 MG tablet TAKE 1 TABLET BY MOUTH EVERY DAY  30 tablet  11  . ezetimibe-simvastatin (VYTORIN) 10-20 MG per tablet Take 1 tablet by mouth at bedtime.  90 tablet  3  . multivitamin (THERAGRAN) per tablet Take 1 tablet by mouth daily.         . SUMAtriptan (IMITREX) 25 MG tablet Take 25 mg by mouth every 2 (two) hours as needed.        Marland Kitchen NASONEX 50 MCG/ACT nasal spray INHALE 2 SPRAYS EACH NOSTRIL EVERY DAY  17 g  11   No current facility-administered medications on file prior to visit.    BP 150/80  Pulse 80  Temp(Src) 98.3 F (36.8 C)  Resp 16  Ht 6' (1.829 m)  Wt 194 lb (87.998 kg)  BMI 26.31 kg/m2       Objective:   Physical Exam  Nursing note and vitals reviewed. Constitutional: He is oriented to person, place, and time. He appears well-developed and well-nourished.  HENT:  Head: Normocephalic and atraumatic.  Eyes: Conjunctivae are normal. Pupils are equal, round, and reactive to light.  Neck: Normal range of motion. Neck supple.  Cardiovascular: Normal rate and regular rhythm.   Pulmonary/Chest: Effort normal and breath sounds normal.  Abdominal: Soft. Bowel sounds are normal.  Genitourinary: Rectum normal and prostate normal.  Musculoskeletal: Normal range of motion.  Neurological: He is alert and oriented to person, place, and time.  Skin: Skin is warm and dry.  Psychiatric: His behavior is normal.          Assessment & Plan:   Past Medical History  Diagnosis Date  . Headache   . Sinusitis   . Hyperlipidemia   .  Hypertension     History   Social History  . Marital Status: Married    Spouse Name: N/A    Number of Children: N/A  . Years of Education: N/A   Occupational History  . Not on file.   Social History Main Topics  . Smoking status: Never Smoker   . Smokeless tobacco: Not on file  . Alcohol Use: Yes  . Drug Use: No  . Sexually Active: Not on file   Other Topics Concern  . Not on file   Social History Narrative  . No narrative on file    Past Surgical History  Procedure Laterality Date  . Colonscopy      Family History  Problem Relation Age of Onset  . Hypertension    . Glaucoma      No Known Allergies  Current Outpatient Prescriptions on File Prior to  Visit  Medication Sig Dispense Refill  . bisoprolol (ZEBETA) 5 MG tablet TAKE 1 TABLET BY MOUTH EVERY DAY  30 tablet  11  . ezetimibe-simvastatin (VYTORIN) 10-20 MG per tablet Take 1 tablet by mouth at bedtime.  90 tablet  3  . multivitamin (THERAGRAN) per tablet Take 1 tablet by mouth daily.        . SUMAtriptan (IMITREX) 25 MG tablet Take 25 mg by mouth every 2 (two) hours as needed.        Marland Kitchen NASONEX 50 MCG/ACT nasal spray INHALE 2 SPRAYS EACH NOSTRIL EVERY DAY  17 g  11   No current facility-administered medications on file prior to visit.    BP 150/80  Pulse 80  Temp(Src) 98.3 F (36.8 C)  Resp 16  Ht 6' (1.829 m)  Wt 194 lb (87.998 kg)  BMI 26.31 kg/m2    Patient presents for yearly preventative medicine examination.   all immunizations and health maintenance protocols were reviewed with the patient and they are up to date with these protocols.   screening laboratory values were reviewed with the patient including screening of hyperlipidemia PSA renal function and hepatic function.   There medications past medical history social history problem list and allergies were reviewed in detail.   Goals were established with regard to weight loss exercise diet in compliance with medications

## 2012-08-26 ENCOUNTER — Other Ambulatory Visit: Payer: Self-pay | Admitting: Internal Medicine

## 2012-12-15 ENCOUNTER — Ambulatory Visit (INDEPENDENT_AMBULATORY_CARE_PROVIDER_SITE_OTHER): Payer: BC Managed Care – PPO | Admitting: Internal Medicine

## 2012-12-15 ENCOUNTER — Encounter: Payer: Self-pay | Admitting: Internal Medicine

## 2012-12-15 VITALS — BP 155/90 | HR 80 | Temp 98.0°F | Resp 16 | Ht 72.0 in | Wt 198.0 lb

## 2012-12-15 DIAGNOSIS — E162 Hypoglycemia, unspecified: Secondary | ICD-10-CM

## 2012-12-15 DIAGNOSIS — I10 Essential (primary) hypertension: Secondary | ICD-10-CM

## 2012-12-15 MED ORDER — BISOPROLOL-HYDROCHLOROTHIAZIDE 5-6.25 MG PO TABS: 1.0000 | ORAL_TABLET | Freq: Every day | ORAL | Status: DC

## 2012-12-15 NOTE — Patient Instructions (Signed)
Look for the KIND bars

## 2012-12-15 NOTE — Progress Notes (Signed)
  Subjective:    Patient ID: Frank Joseph, male    DOB: 10-Dec-1955, 57 y.o.   MRN: [id number]  HPI Increased anxiety and white coat Mild HTN and increased with moderate risks     Review of Systems  Constitutional: Negative for fever and fatigue.  HENT: Negative for hearing loss, congestion, neck pain and postnasal drip.   Eyes: Negative for discharge, redness and visual disturbance.  Respiratory: Negative for cough, shortness of breath and wheezing.   Cardiovascular: Negative for leg swelling.  Gastrointestinal: Negative for abdominal pain, constipation and abdominal distention.  Genitourinary: Negative for urgency and frequency.  Musculoskeletal: Negative for joint swelling and arthralgias.  Skin: Negative for color change and rash.  Neurological: Negative for weakness and light-headedness.  Hematological: Negative for adenopathy.  Psychiatric/Behavioral: Negative for behavioral problems.   Past Medical History  Diagnosis Date  . Headache(784.0)   . Sinusitis   . Hyperlipidemia   . Hypertension     History   Social History  . Marital Status: Married    Spouse Name: N/A    Number of Children: N/A  . Years of Education: N/A   Occupational History  . Not on file.   Social History Main Topics  . Smoking status: Never Smoker   . Smokeless tobacco: Not on file  . Alcohol Use: Yes  . Drug Use: No  . Sexual Activity: Not on file   Other Topics Concern  . Not on file   Social History Narrative  . No narrative on file    Past Surgical History  Procedure Laterality Date  . Colonscopy      Family History  Problem Relation Age of Onset  . Hypertension    . Glaucoma      No Known Allergies  Current Outpatient Prescriptions on File Prior to Visit  Medication Sig Dispense Refill  . bisoprolol (ZEBETA) 5 MG tablet TAKE 1 TABLET BY MOUTH EVERY DAY  30 tablet  11  . ezetimibe-simvastatin (VYTORIN) 10-20 MG per tablet Take 1 tablet by mouth at bedtime.  90  tablet  3  . multivitamin (THERAGRAN) per tablet Take 1 tablet by mouth daily.        Marland Kitchen NASONEX 50 MCG/ACT nasal spray INHALE 2 SPRAYS EACH NOSTRIL EVERY DAY  17 g  11  . SUMAtriptan (IMITREX) 25 MG tablet Take 25 mg by mouth every 2 (two) hours as needed.         No current facility-administered medications on file prior to visit.    BP 155/90  Pulse 80  Temp(Src) 98 F (36.7 C)  Resp 16  Ht 6' (1.829 m)  Wt 198 lb (89.812 kg)  BMI 26.85 kg/m2   '    Objective:   Physical Exam  Nursing note and vitals reviewed. Constitutional: He appears well-developed and well-nourished.  HENT:  Head: Normocephalic and atraumatic.  Eyes: Conjunctivae are normal. Pupils are equal, round, and reactive to light.  Neck: Normal range of motion. Neck supple.  Cardiovascular: Normal rate and regular rhythm.   Pulmonary/Chest: Effort normal and breath sounds normal.  Abdominal: Soft. Bowel sounds are normal.          Assessment & Plan:  Minimal change in blood pressure control with changing from bisoprolol to ziac 5/6.25

## 2012-12-28 ENCOUNTER — Other Ambulatory Visit: Payer: Self-pay | Admitting: *Deleted

## 2012-12-28 MED ORDER — EZETIMIBE-SIMVASTATIN 10-20 MG PO TABS: 1.0000 | ORAL_TABLET | Freq: Every day | ORAL | Status: DC

## 2013-03-21 ENCOUNTER — Other Ambulatory Visit: Payer: Self-pay | Admitting: *Deleted

## 2013-03-21 MED ORDER — EZETIMIBE-SIMVASTATIN 10-20 MG PO TABS: 1.0000 | ORAL_TABLET | Freq: Every day | ORAL | Status: DC

## 2013-06-11 ENCOUNTER — Other Ambulatory Visit (INDEPENDENT_AMBULATORY_CARE_PROVIDER_SITE_OTHER): Payer: BC Managed Care – PPO

## 2013-06-11 DIAGNOSIS — Z0000 Encounter for general adult medical examination without abnormal findings: Secondary | ICD-10-CM

## 2013-06-11 DIAGNOSIS — E785 Hyperlipidemia, unspecified: Secondary | ICD-10-CM

## 2013-06-11 DIAGNOSIS — I10 Essential (primary) hypertension: Secondary | ICD-10-CM

## 2013-06-11 LAB — TSH: TSH: 3.53 u[IU]/mL (ref 0.35–5.50)

## 2013-06-11 LAB — CBC WITH DIFFERENTIAL/PLATELET
BASOS ABS: 0 10*3/uL (ref 0.0–0.1)
Basophils Relative: 0.6 % (ref 0.0–3.0)
Eosinophils Absolute: 0.1 10*3/uL (ref 0.0–0.7)
Eosinophils Relative: 1.8 % (ref 0.0–5.0)
HEMATOCRIT: 47.2 % (ref 39.0–52.0)
HEMOGLOBIN: 15.8 g/dL (ref 13.0–17.0)
LYMPHS ABS: 1.3 10*3/uL (ref 0.7–4.0)
Lymphocytes Relative: 28.8 % (ref 12.0–46.0)
MCHC: 33.5 g/dL (ref 30.0–36.0)
MCV: 94.8 fl (ref 78.0–100.0)
MONOS PCT: 9.3 % (ref 3.0–12.0)
Monocytes Absolute: 0.4 10*3/uL (ref 0.1–1.0)
NEUTROS ABS: 2.7 10*3/uL (ref 1.4–7.7)
Neutrophils Relative %: 59.5 % (ref 43.0–77.0)
Platelets: 114 10*3/uL — ABNORMAL LOW (ref 150.0–400.0)
RBC: 4.97 Mil/uL (ref 4.22–5.81)
RDW: 12.8 % (ref 11.5–14.6)
WBC: 4.5 10*3/uL (ref 4.5–10.5)

## 2013-06-11 LAB — BASIC METABOLIC PANEL
BUN: 17 mg/dL (ref 6–23)
CHLORIDE: 104 meq/L (ref 96–112)
CO2: 26 meq/L (ref 19–32)
Calcium: 9.4 mg/dL (ref 8.4–10.5)
Creatinine, Ser: 1.1 mg/dL (ref 0.4–1.5)
GFR: 73.78 mL/min (ref >60.00)
GLUCOSE: 98 mg/dL (ref 70–99)
POTASSIUM: 5 meq/L (ref 3.5–5.1)
SODIUM: 140 meq/L (ref 135–145)

## 2013-06-11 LAB — POCT URINALYSIS DIPSTICK
Bilirubin, UA: NEGATIVE
Blood, UA: NEGATIVE
Glucose, UA: NEGATIVE
KETONES UA: NEGATIVE
Leukocytes, UA: NEGATIVE
Nitrite, UA: NEGATIVE
PROTEIN UA: NEGATIVE
SPEC GRAV UA: 1.02
Urobilinogen, UA: 0.2
pH, UA: 6

## 2013-06-11 LAB — LIPID PANEL
CHOL/HDL RATIO: 3
Cholesterol: 134 mg/dL (ref 0–200)
HDL: 41.8 mg/dL (ref >39.00)
TRIGLYCERIDES: 214 mg/dL — AB (ref 0.0–149.0)
VLDL: 42.8 mg/dL — AB (ref 0.0–40.0)

## 2013-06-11 LAB — HEPATIC FUNCTION PANEL
ALT: 43 U/L (ref 0–53)
AST: 32 U/L (ref 0–37)
Albumin: 4.5 g/dL (ref 3.5–5.2)
Alkaline Phosphatase: 50 U/L (ref 39–117)
BILIRUBIN TOTAL: 1.4 mg/dL — AB (ref 0.3–1.2)
Bilirubin, Direct: 0.1 mg/dL (ref 0.0–0.3)
Total Protein: 6.9 g/dL (ref 6.0–8.3)

## 2013-06-11 LAB — PSA: PSA: 1.04 ng/mL (ref 0.10–4.00)

## 2013-06-13 LAB — LDL CHOLESTEROL, DIRECT: LDL DIRECT: 58.7 mg/dL

## 2013-06-18 ENCOUNTER — Encounter: Payer: Self-pay | Admitting: Internal Medicine

## 2013-06-18 ENCOUNTER — Ambulatory Visit (INDEPENDENT_AMBULATORY_CARE_PROVIDER_SITE_OTHER): Payer: BC Managed Care – PPO | Admitting: Internal Medicine

## 2013-06-18 VITALS — BP 140/88 | HR 76 | Temp 98.0°F | Resp 16 | Ht 72.0 in | Wt 198.0 lb

## 2013-06-18 DIAGNOSIS — Z0000 Encounter for general adult medical examination without abnormal findings: Secondary | ICD-10-CM

## 2013-06-18 DIAGNOSIS — E785 Hyperlipidemia, unspecified: Secondary | ICD-10-CM

## 2013-06-18 MED ORDER — SUMATRIPTAN SUCCINATE 25 MG PO TABS: 25.0000 mg | ORAL_TABLET | ORAL | Status: DC | PRN

## 2013-06-18 NOTE — Patient Instructions (Signed)
The patient is instructed to continue all medications as prescribed. Schedule followup with check out clerk upon leaving the clinic   BP 140/88  Pulse 76  Temp(Src) 98 F (36.7 C)  Resp 16  Ht 6' (1.829 m)  Wt 198 lb (89.812 kg)  BMI 26.85 kg/m2

## 2013-06-18 NOTE — Progress Notes (Signed)
Subjective:    Patient ID: Frank Joseph, male    DOB: Sep 03, 1955, 58 y.o.   MRN: [id number]  HPI CPX HTN, lipids and Migraines Migraines are rare and needed refill due to age of Rx Lipids on vytorin HTn on BB     Review of Systems  Constitutional: Negative for fever and fatigue.  HENT: Negative for congestion, hearing loss and postnasal drip.   Eyes: Negative for discharge, redness and visual disturbance.  Respiratory: Negative for cough, shortness of breath and wheezing.   Cardiovascular: Negative for leg swelling.  Gastrointestinal: Negative for abdominal pain, constipation and abdominal distention.  Genitourinary: Negative for urgency and frequency.  Musculoskeletal: Negative for arthralgias, joint swelling and neck pain.  Skin: Negative for color change and rash.  Neurological: Negative for weakness and light-headedness.  Hematological: Negative for adenopathy.  Psychiatric/Behavioral: Negative for behavioral problems.   Past Medical History  Diagnosis Date  . Headache(784.0)   . Sinusitis   . Hyperlipidemia   . Hypertension     History   Social History  . Marital Status: Married    Spouse Name: N/A    Number of Children: N/A  . Years of Education: N/A   Occupational History  . Not on file.   Social History Main Topics  . Smoking status: Never Smoker   . Smokeless tobacco: Not on file  . Alcohol Use: Yes  . Drug Use: No  . Sexual Activity: Not on file   Other Topics Concern  . Not on file   Social History Narrative  . No narrative on file    Past Surgical History  Procedure Laterality Date  . Colonscopy      Family History  Problem Relation Age of Onset  . Hypertension    . Glaucoma      No Known Allergies  Current Outpatient Prescriptions on File Prior to Visit  Medication Sig Dispense Refill  . aspirin 81 MG tablet Take 81 mg by mouth daily.      . bisoprolol (ZEBETA) 5 MG tablet TAKE 1 TABLET BY MOUTH EVERY DAY  30 tablet  11   . ezetimibe-simvastatin (VYTORIN) 10-20 MG per tablet Take 1 tablet by mouth at bedtime.  90 tablet  3  . multivitamin (THERAGRAN) per tablet Take 1 tablet by mouth daily.        Marland Kitchen NASONEX 50 MCG/ACT nasal spray INHALE 2 SPRAYS EACH NOSTRIL EVERY DAY  17 g  11   No current facility-administered medications on file prior to visit.    BP 140/88  Pulse 76  Temp(Src) 98 F (36.7 C)  Resp 16  Ht 6' (1.829 m)  Wt 198 lb (89.812 kg)  BMI 26.85 kg/m2       Objective:   Physical Exam  Nursing note and vitals reviewed. Constitutional: He is oriented to person, place, and time. He appears well-developed and well-nourished.  HENT:  Head: Normocephalic and atraumatic.  Eyes: Conjunctivae are normal. Pupils are equal, round, and reactive to light.  Neck: Normal range of motion. Neck supple.  Cardiovascular: Normal rate and regular rhythm.   Pulmonary/Chest: Effort normal and breath sounds normal.  Abdominal: Soft. Bowel sounds are normal.  Genitourinary: Rectum normal and prostate normal.  Musculoskeletal: Normal range of motion.  Neurological: He is alert and oriented to person, place, and time.  Skin: Skin is warm and dry.  Psychiatric: He has a normal mood and affect.          Assessment & Plan:  Patient presents for yearly preventative medicine examination.   all immunizations and health maintenance protocols were reviewed with the patient and they are up to date with these protocols.   screening laboratory values were reviewed with the patient including screening of hyperlipidemia PSA renal function and hepatic function.   There medications past medical history social history problem list and allergies were reviewed in detail.   Goals were established with regard to weight loss exercise diet in compliance with medications  

## 2013-11-30 ENCOUNTER — Other Ambulatory Visit: Payer: Self-pay | Admitting: Internal Medicine

## 2013-12-11 ENCOUNTER — Other Ambulatory Visit (INDEPENDENT_AMBULATORY_CARE_PROVIDER_SITE_OTHER): Payer: BC Managed Care – PPO

## 2013-12-11 DIAGNOSIS — Z0000 Encounter for general adult medical examination without abnormal findings: Secondary | ICD-10-CM

## 2013-12-11 LAB — POCT URINALYSIS DIPSTICK
Bilirubin, UA: NEGATIVE
Glucose, UA: NEGATIVE
Ketones, UA: NEGATIVE
Leukocytes, UA: NEGATIVE
NITRITE UA: NEGATIVE
PH UA: 7
RBC UA: NEGATIVE
Spec Grav, UA: 1.015
Urobilinogen, UA: 1

## 2013-12-11 LAB — BASIC METABOLIC PANEL
BUN: 17 mg/dL (ref 6–23)
CHLORIDE: 102 meq/L (ref 96–112)
CO2: 31 mEq/L (ref 19–32)
Calcium: 9.4 mg/dL (ref 8.4–10.5)
Creatinine, Ser: 1 mg/dL (ref 0.4–1.5)
GFR: 81.36 mL/min (ref >60.00)
Glucose, Bld: 81 mg/dL (ref 70–99)
Potassium: 4.6 mEq/L (ref 3.5–5.1)
SODIUM: 140 meq/L (ref 135–145)

## 2013-12-11 LAB — CBC WITH DIFFERENTIAL/PLATELET
BASOS PCT: 0.5 % (ref 0.0–3.0)
Basophils Absolute: 0 10*3/uL (ref 0.0–0.1)
EOS PCT: 2.1 % (ref 0.0–5.0)
Eosinophils Absolute: 0.1 10*3/uL (ref 0.0–0.7)
HEMATOCRIT: 47 % (ref 39.0–52.0)
HEMOGLOBIN: 16 g/dL (ref 13.0–17.0)
LYMPHS ABS: 1.2 10*3/uL (ref 0.7–4.0)
Lymphocytes Relative: 24.4 % (ref 12.0–46.0)
MCHC: 34.1 g/dL (ref 30.0–36.0)
MCV: 93.9 fl (ref 78.0–100.0)
MONO ABS: 0.4 10*3/uL (ref 0.1–1.0)
Monocytes Relative: 8.8 % (ref 3.0–12.0)
NEUTROS ABS: 3.2 10*3/uL (ref 1.4–7.7)
Neutrophils Relative %: 64.2 % (ref 43.0–77.0)
Platelets: 118 10*3/uL — ABNORMAL LOW (ref 150.0–400.0)
RBC: 5.01 Mil/uL (ref 4.22–5.81)
RDW: 12.7 % (ref 11.5–15.5)
WBC: 5 10*3/uL (ref 4.0–10.5)

## 2013-12-11 LAB — HEPATIC FUNCTION PANEL
ALBUMIN: 4.3 g/dL (ref 3.5–5.2)
ALT: 36 U/L (ref 0–53)
AST: 27 U/L (ref 0–37)
Alkaline Phosphatase: 55 U/L (ref 39–117)
Bilirubin, Direct: 0.2 mg/dL (ref 0.0–0.3)
TOTAL PROTEIN: 7.1 g/dL (ref 6.0–8.3)
Total Bilirubin: 1.5 mg/dL — ABNORMAL HIGH (ref 0.2–1.2)

## 2013-12-11 LAB — LIPID PANEL
Cholesterol: 148 mg/dL (ref 0–200)
HDL: 40.6 mg/dL (ref >39.00)
NONHDL: 107.4
Total CHOL/HDL Ratio: 4
Triglycerides: 314 mg/dL — ABNORMAL HIGH (ref 0.0–149.0)
VLDL: 62.8 mg/dL — AB (ref 0.0–40.0)

## 2013-12-11 LAB — PSA: PSA: 1.33 ng/mL (ref 0.10–4.00)

## 2013-12-11 LAB — TSH: TSH: 5.34 u[IU]/mL — ABNORMAL HIGH (ref 0.35–4.50)

## 2013-12-11 LAB — LDL CHOLESTEROL, DIRECT: LDL DIRECT: 63.5 mg/dL

## 2013-12-12 ENCOUNTER — Telehealth: Payer: Self-pay

## 2013-12-12 NOTE — Telephone Encounter (Signed)
I do not see links with b12 causing high cholesterol/triglycerides. High protein diet can worsen cholesterol. Not sure of links with Splenda but I do not routinely recommend sweeteners like splenda-prefer unsweet or water. Regular exercise and reducing sedentary activity can help cholesterol and triglycerides. He should start this as long as no chest pain or shortness of breath.  Avoid alcohol as this can raise them.   One of BP medicines (bisoprolol) can contribute but would not stop this medicine.

## 2013-12-12 NOTE — Telephone Encounter (Signed)
Pt would like to know if taking a B12 supplement could have caused his Cholesterol/ Triglicerides to be high along with eating a lot of protein and drinking unsweet tea with Splenda. He would like to know what he needs to do between now and his next OV. Please advise.

## 2013-12-12 NOTE — Telephone Encounter (Signed)
Pt.notified

## 2013-12-17 ENCOUNTER — Ambulatory Visit: Payer: BC Managed Care – PPO | Admitting: Internal Medicine

## 2014-01-03 ENCOUNTER — Other Ambulatory Visit (INDEPENDENT_AMBULATORY_CARE_PROVIDER_SITE_OTHER): Payer: BC Managed Care – PPO

## 2014-01-03 DIAGNOSIS — E039 Hypothyroidism, unspecified: Secondary | ICD-10-CM

## 2014-01-03 LAB — T4, FREE: FREE T4: 0.8 ng/dL (ref 0.60–1.60)

## 2014-01-04 ENCOUNTER — Encounter: Payer: Self-pay | Admitting: Family Medicine

## 2014-01-04 ENCOUNTER — Ambulatory Visit (INDEPENDENT_AMBULATORY_CARE_PROVIDER_SITE_OTHER): Payer: BC Managed Care – PPO | Admitting: Family Medicine

## 2014-01-04 ENCOUNTER — Ambulatory Visit: Payer: BC Managed Care – PPO | Admitting: Family Medicine

## 2014-01-04 VITALS — BP 140/94 | HR 70 | Temp 98.1°F | Wt 196.0 lb

## 2014-01-04 DIAGNOSIS — I10 Essential (primary) hypertension: Secondary | ICD-10-CM

## 2014-01-04 DIAGNOSIS — J309 Allergic rhinitis, unspecified: Secondary | ICD-10-CM | POA: Insufficient documentation

## 2014-01-04 DIAGNOSIS — R972 Elevated prostate specific antigen [PSA]: Secondary | ICD-10-CM

## 2014-01-04 DIAGNOSIS — Z23 Encounter for immunization: Secondary | ICD-10-CM

## 2014-01-04 DIAGNOSIS — R51 Headache: Secondary | ICD-10-CM

## 2014-01-04 DIAGNOSIS — E785 Hyperlipidemia, unspecified: Secondary | ICD-10-CM

## 2014-01-04 NOTE — Progress Notes (Signed)
Frank Reddish, MD Phone: [phone]  Subjective:  Patient presents today to establish care with me as their new primary care provider. Patient was formerly a patient of Dr. Arnoldo Morale. Chief complaint-noted.   Hypertension-slight poor control BP Readings from Last 3 Encounters:  01/04/14 140/94  06/18/13 140/88  12/15/12 155/90  Home BP monitoring-126/83 on average checks at least once a day Compliant with medications-yes without side effects ROS-Denies any CP, HA, SOB, blurry vision, LE edema, transient weakness, orthopnea, PND.   Hyperlipidemia-controlled Lab Results  Component Value Date   LDLCALC 53 04/12/2012  On statin: Vytorin Regular exercise: Yes typically walks 3 miles a day ROS- no chest pain or shortness of breath. No myalgias  Migraines - controlled Has been an issue for 20 years. Uses sumatriptan as needed. One headache in last month controlled with this ROS-no blurry vision or lightheadedness  The following were reviewed and entered/updated in epic: Past Medical History  Diagnosis Date  . Headache(784.0)   . Sinusitis   . Hyperlipidemia   . Hypertension    Patient Active Problem List   Diagnosis Date Noted  . ESSENTIAL HYPERTENSION 12/09/2009    Priority: Medium  . HYPERLIPIDEMIA 03/07/2007    Priority: Medium  . Headache(784.0) 12/08/2006    Priority: Medium  . Seasonal allergies 01/04/2014    Priority: Low  . Anxiety state, unspecified 05/25/2010    Priority: Low   Past Surgical History  Procedure Laterality Date  . Colonscopy      Family History  Problem Relation Age of Onset  . Hypertension Mother   . Glaucoma Father     Medications- reviewed and updated Current Outpatient Prescriptions  Medication Sig Dispense Refill  . aspirin 81 MG tablet Take 81 mg by mouth daily.      . bisoprolol-hydrochlorothiazide (ZIAC) 5-6.25 MG per tablet TAKE 1 TABLET BY MOUTH DAILY.  30 tablet  5  . ezetimibe-simvastatin (VYTORIN) 10-20 MG per tablet  Take 1 tablet by mouth at bedtime.  90 tablet  3  . multivitamin (THERAGRAN) per tablet Take 1 tablet by mouth daily.        Marland Kitchen NASONEX 50 MCG/ACT nasal spray INHALE 2 SPRAYS EACH NOSTRIL EVERY DAY  17 g  11  . SUMAtriptan (IMITREX) 25 MG tablet Take 1 tablet (25 mg total) by mouth every 2 (two) hours as needed.  10 tablet  6   No current facility-administered medications for this visit.    Allergies-reviewed and updated No Known Allergies  History   Social History  . Marital Status: Married    Spouse Name: N/A    Number of Children: N/A  . Years of Education: N/A   Social History Main Topics  . Smoking status: Never Smoker   . Smokeless tobacco: None  . Alcohol Use: Yes     Comment: 2-24  . Drug Use: No  . Sexual Activity: None   Other Topics Concern  . None   Social History Narrative   Married 35 years in 2015. 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    Played baseball at [employer] [employer]. Wife went to [employer].       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 Oropharynx normal Neck: no thyromegaly CV: RRR no murmurs rubs or gallops Lungs: CTAB no crackles, wheeze, rhonchi Abdomen: soft/nontender/nondistended/normal bowel sounds. No rebound or guarding.  Ext: no edema Skin: warm, dry Neuro: grossly normal, moves all extremities, PERRLA  Assessment/Plan:  ESSENTIAL HYPERTENSION Likely white coat element. Bring home cuff to compare next visit. I trust home readings though at typically 125/85. Continue bisoprolol hydrochlorothiazide  Headache(784.0) Well-controlled with when necessary Imitrex. Continue.  HYPERLIPIDEMIA Well-controlled on Vytorin. Triglycerides  elevated but at a reasonable level given already on statin. Continue, consider triglycerides check in 6 months   increasing PSA-also PSA is trending up so will recheck in 6 months and do rectal exam

## 2014-01-04 NOTE — Assessment & Plan Note (Signed)
Likely white coat element. Bring home cuff to compare next visit. I trust home readings though at typically 125/85. Continue bisoprolol hydrochlorothiazide

## 2014-01-04 NOTE — Assessment & Plan Note (Addendum)
Well-controlled on Vytorin. Triglycerides elevated but at a reasonable level given already on statin. Continue, consider triglycerides check in 6 months

## 2014-01-04 NOTE — Assessment & Plan Note (Signed)
Well-controlled with when necessary Imitrex. Continue.

## 2014-01-04 NOTE — Patient Instructions (Addendum)
Overall, things look great today.   Blood pressure up in office but I trust your home cuff. Bring your cuff with you at next visit so we can compare. See me in 6 months. We will check your prostate exam and do a PSA at that time. Blood work week before-triglycerides or lipid panel if insurance will pay for it, cmet, TSH, PSA.   Flu shot today

## 2014-03-18 ENCOUNTER — Encounter: Payer: Self-pay | Admitting: Family Medicine

## 2014-03-18 ENCOUNTER — Ambulatory Visit (INDEPENDENT_AMBULATORY_CARE_PROVIDER_SITE_OTHER): Payer: BC Managed Care – PPO | Admitting: Family Medicine

## 2014-03-18 VITALS — BP 140/84 | HR 74 | Temp 98.0°F | Wt 200.0 lb

## 2014-03-18 DIAGNOSIS — R1031 Right lower quadrant pain: Secondary | ICD-10-CM

## 2014-03-18 NOTE — Progress Notes (Signed)
  [name], MD Phone: [phone]  Subjective:   Frank Joseph is a 58 y.o. year old very pleasant male patient who presents with the following:  Right groin/abdominal pain 2/10 catch feeling. Experiences a few times a day. When he pushes on the area the pain goes away. Seems to be worse with sitting and then moving. Does not bother him with golfing. about3 months. Has improved slightly.  ROS- no bulge or warmth. No fever/chills/nausea/vomiting.   Comforted patient about PSA testing and thyroid.   Weight goals 190-192.   Tends to snore more when weight is up. Consider OSA testing.   Walking everyday.   Past Medical History- Patient Active Problem List   Diagnosis Date Noted  . ESSENTIAL HYPERTENSION 12/09/2009    Priority: Medium  . HYPERLIPIDEMIA 03/07/2007    Priority: Medium  . Headache(784.0) 12/08/2006    Priority: Medium  . Seasonal allergies 01/04/2014    Priority: Low  . Anxiety state, unspecified 05/25/2010    Priority: Low   Medications- reviewed and updated Current Outpatient Prescriptions  Medication Sig Dispense Refill  . aspirin 81 MG tablet Take 81 mg by mouth daily.    . bisoprolol-hydrochlorothiazide (ZIAC) 5-6.25 MG per tablet TAKE 1 TABLET BY MOUTH DAILY. 30 tablet 5  . ezetimibe-simvastatin (VYTORIN) 10-20 MG per tablet Take 1 tablet by mouth at bedtime. 90 tablet 3  . multivitamin (THERAGRAN) per tablet Take 1 tablet by mouth daily.      . SUMAtriptan (IMITREX) 25 MG tablet Take 1 tablet (25 mg total) by mouth every 2 (two) hours as needed. 10 tablet 6  . NASONEX 50 MCG/ACT nasal spray INHALE 2 SPRAYS EACH NOSTRIL EVERY DAY (Patient not taking: Reported on 03/18/2014) 17 g 11   No current facility-administered medications for this visit.    Objective: BP 140/84 mmHg  Pulse 74  Temp(Src) 98 F (36.7 C)  Wt 200 lb (90.719 kg) Gen: NAD, resting comfortably CV: RRR no murmurs rubs or gallops Lungs: CTAB no crackles, wheeze,  rhonchi Abdomen: soft/nontender/nondistended/normal bowel sounds. No rebound or guarding. No bulge in groin or signs of hernia. Very very mild tenderness in right groin with deep palpation.  Ext: no edema Skin: warm, dry, no rash  Neuro: grossly normal, moves all extremities   Assessment/Plan:  RLQ/groin Pain No red flags and overall improving. May be muscle strain. I comforted patient and walked him through each concern. Discussed labs/imaging which I considered low yield. Red flags for return discussed.   I also spent time discussing with patient his "elevated" PSA which was normal elevation per year as well as thyroid which was normal when considering free t4 testing. Patient also is snoring more so we discussed weight goal 192 and recheck and if persistent consider OSA testing.   >50% of 20 minute office visit was spent on counseling (concerns over abdominal pain, PSA, thyroid) and coordination of care

## 2014-03-18 NOTE — Patient Instructions (Signed)
Abdominal pain-doubt anything serious like gallbladder, hernia. This could be a mild muscle strain. Doubt arthritis as well.   Reasons for return-fever/chills/worsening pain but I doubt these will happen.   PSA was fine. Recheck in a year. Thyroid also fine-recheck in a year.   You set a weight goal of 192. Bring your blood pressure cuff with you next visit. Glad your #s are looking good at home-they look better in office than last time but still want to see a little lower or verify home cuff.

## 2014-04-12 ENCOUNTER — Telehealth: Payer: Self-pay | Admitting: Family Medicine

## 2014-04-12 NOTE — Telephone Encounter (Signed)
Erro

## 2014-05-01 ENCOUNTER — Encounter: Payer: Self-pay | Admitting: Family Medicine

## 2014-05-01 ENCOUNTER — Ambulatory Visit (INDEPENDENT_AMBULATORY_CARE_PROVIDER_SITE_OTHER): Payer: BLUE CROSS/BLUE SHIELD | Admitting: Family Medicine

## 2014-05-01 VITALS — BP 140/80 | HR 78 | Temp 98.1°F | Wt 201.0 lb

## 2014-05-01 DIAGNOSIS — J019 Acute sinusitis, unspecified: Secondary | ICD-10-CM

## 2014-05-01 MED ORDER — AMOXICILLIN-POT CLAVULANATE 875-125 MG PO TABS: 1.0000 | ORAL_TABLET | Freq: Two times a day (BID) | ORAL | Status: DC

## 2014-05-01 NOTE — Progress Notes (Signed)
Pre visit review using our clinic review tool, if applicable. No additional management support is needed unless otherwise documented below in the visit note. 

## 2014-05-01 NOTE — Progress Notes (Signed)
   Subjective:    Patient ID: Frank Joseph, male    DOB: 1955/09/08, 59 y.o.   MRN: [id number]  HPI Acute visit. Patient seen with about 2 week history of cough which is occasionally productive. Has had some facial pain right frontal and maxillary region mostly. Increased congestion especially at night. He had loss of voice last weekend. No fever. Intermittent headaches. Increased malaise. Intermittent sore throat.  Past Medical History  Diagnosis Date  . Headache(784.0)   . Sinusitis   . Hyperlipidemia   . Hypertension    Past Surgical History  Procedure Laterality Date  . Colonscopy      reports that he has never smoked. He does not have any smokeless tobacco history on file. He reports that he drinks alcohol. He reports that he does not use illicit drugs. family history includes Glaucoma in his father; Hypertension in his mother. No Known Allergies    Review of Systems  Constitutional: Negative for fever and chills.  HENT: Positive for congestion and sinus pressure.   Respiratory: Positive for cough.   Neurological: Positive for headaches.       Objective:   Physical Exam  Constitutional: He appears well-developed and well-nourished.  HENT:  Right Ear: External ear normal.  Left Ear: External ear normal.  Mouth/Throat: Oropharynx is clear and moist.  Neck: Neck supple.  Cardiovascular: Normal rate and regular rhythm.   Pulmonary/Chest: Effort normal and breath sounds normal. No respiratory distress. He has no wheezes. He has no rales.  Lymphadenopathy:    He has no cervical adenopathy.          Assessment & Plan:  Acute sinusitis symptoms. Given worsening of symptoms at 2+ weeks and mostly unilateral involvement go ahead and start Augmentin 875 mg twice daily for 10 days. Consider over-the-counter Mucinex. Continue saline nasal irrigation

## 2014-05-01 NOTE — Patient Instructions (Signed)

## 2014-05-24 ENCOUNTER — Other Ambulatory Visit: Payer: Self-pay

## 2014-05-24 MED ORDER — EZETIMIBE-SIMVASTATIN 10-20 MG PO TABS: 1.0000 | ORAL_TABLET | Freq: Every day | ORAL | Status: DC

## 2014-05-24 NOTE — Addendum Note (Signed)
Addended by: Colleen Can on: 05/24/2014 11:58 AM   Modules accepted: Orders

## 2014-05-24 NOTE — Telephone Encounter (Signed)
Rx request for Vytorin tablet- Take 1 tablet by mouth at bedtime. #90  Pharm:  [facility]   Rx sent to pharmacy.

## 2014-05-27 ENCOUNTER — Other Ambulatory Visit: Payer: Self-pay

## 2014-05-27 MED ORDER — BISOPROLOL-HYDROCHLOROTHIAZIDE 5-6.25 MG PO TABS: 1.0000 | ORAL_TABLET | Freq: Every day | ORAL | Status: DC

## 2014-05-27 NOTE — Telephone Encounter (Signed)
Rx request for Bisoprolol-HCTZ 5-6.25 mg tablet-Take 1 tablet by mouth daily #30  Pharm:  [facility]    Rx sent to pharmacy.

## 2014-06-27 ENCOUNTER — Other Ambulatory Visit: Payer: BC Managed Care – PPO

## 2014-07-04 ENCOUNTER — Ambulatory Visit: Payer: BC Managed Care – PPO | Admitting: Family Medicine

## 2014-08-08 ENCOUNTER — Telehealth: Payer: Self-pay | Admitting: Family Medicine

## 2014-08-08 NOTE — Telephone Encounter (Signed)
Pt having bp issues running high. Pt hasn't felt right and bp is 142/98  ; 155/100.   Pt wants to see you Friday, no appt. Pt made appt w/ dr Regis Bill at 8:30 in the am.   However, wanted to let you know in case you could see him, pt prefers to see dr Retail banker. But needs to be seen

## 2014-08-09 ENCOUNTER — Ambulatory Visit (INDEPENDENT_AMBULATORY_CARE_PROVIDER_SITE_OTHER): Payer: BLUE CROSS/BLUE SHIELD | Admitting: Internal Medicine

## 2014-08-09 ENCOUNTER — Encounter: Payer: Self-pay | Admitting: Internal Medicine

## 2014-08-09 VITALS — BP 146/94 | Temp 98.2°F | Wt 199.2 lb

## 2014-08-09 DIAGNOSIS — I10 Essential (primary) hypertension: Secondary | ICD-10-CM

## 2014-08-09 DIAGNOSIS — R5383 Other fatigue: Secondary | ICD-10-CM | POA: Diagnosis not present

## 2014-08-09 DIAGNOSIS — E785 Hyperlipidemia, unspecified: Secondary | ICD-10-CM

## 2014-08-09 DIAGNOSIS — R6889 Other general symptoms and signs: Secondary | ICD-10-CM

## 2014-08-09 DIAGNOSIS — Z0000 Encounter for general adult medical examination without abnormal findings: Secondary | ICD-10-CM

## 2014-08-09 DIAGNOSIS — Z418 Encounter for other procedures for purposes other than remedying health state: Secondary | ICD-10-CM

## 2014-08-09 DIAGNOSIS — R69 Illness, unspecified: Secondary | ICD-10-CM

## 2014-08-09 DIAGNOSIS — G478 Other sleep disorders: Secondary | ICD-10-CM

## 2014-08-09 DIAGNOSIS — Z299 Encounter for prophylactic measures, unspecified: Secondary | ICD-10-CM

## 2014-08-09 DIAGNOSIS — Z0189 Encounter for other specified special examinations: Secondary | ICD-10-CM

## 2014-08-09 LAB — POCT URINALYSIS DIPSTICK
BILIRUBIN UA: NEGATIVE
Blood, UA: NEGATIVE
GLUCOSE UA: NEGATIVE
Ketones, UA: NEGATIVE
LEUKOCYTES UA: NEGATIVE
NITRITE UA: NEGATIVE
Protein, UA: NEGATIVE
Spec Grav, UA: 1.02
Urobilinogen, UA: 0.2
pH, UA: 6.5

## 2014-08-09 LAB — LDL CHOLESTEROL, DIRECT: Direct LDL: 63 mg/dL

## 2014-08-09 LAB — HEPATIC FUNCTION PANEL
ALK PHOS: 62 U/L (ref 39–117)
ALT: 45 U/L (ref 0–53)
AST: 32 U/L (ref 0–37)
Albumin: 4.7 g/dL (ref 3.5–5.2)
BILIRUBIN DIRECT: 0.2 mg/dL (ref 0.0–0.3)
TOTAL PROTEIN: 7.3 g/dL (ref 6.0–8.3)
Total Bilirubin: 1.2 mg/dL (ref 0.2–1.2)

## 2014-08-09 LAB — CBC WITH DIFFERENTIAL/PLATELET
BASOS ABS: 0 10*3/uL (ref 0.0–0.1)
Basophils Relative: 0.5 % (ref 0.0–3.0)
EOS ABS: 0.1 10*3/uL (ref 0.0–0.7)
EOS PCT: 1.2 % (ref 0.0–5.0)
HEMATOCRIT: 47.7 % (ref 39.0–52.0)
Hemoglobin: 16.6 g/dL (ref 13.0–17.0)
LYMPHS ABS: 1 10*3/uL (ref 0.7–4.0)
Lymphocytes Relative: 20 % (ref 12.0–46.0)
MCHC: 34.8 g/dL (ref 30.0–36.0)
MCV: 91 fl (ref 78.0–100.0)
MONO ABS: 0.5 10*3/uL (ref 0.1–1.0)
Monocytes Relative: 9.3 % (ref 3.0–12.0)
NEUTROS ABS: 3.5 10*3/uL (ref 1.4–7.7)
Neutrophils Relative %: 69 % (ref 43.0–77.0)
PLATELETS: 120 10*3/uL — AB (ref 150.0–400.0)
RBC: 5.24 Mil/uL (ref 4.22–5.81)
RDW: 13.7 % (ref 11.5–15.5)
WBC: 5.1 10*3/uL (ref 4.0–10.5)

## 2014-08-09 LAB — BASIC METABOLIC PANEL
BUN: 16 mg/dL (ref 6–23)
CO2: 30 meq/L (ref 19–32)
Calcium: 10 mg/dL (ref 8.4–10.5)
Chloride: 102 mEq/L (ref 96–112)
Creatinine, Ser: 1.06 mg/dL (ref 0.40–1.50)
GFR: 75.89 mL/min (ref >60.00)
GLUCOSE: 105 mg/dL — AB (ref 70–99)
Potassium: 5 mEq/L (ref 3.5–5.1)
Sodium: 138 mEq/L (ref 135–145)

## 2014-08-09 LAB — LIPID PANEL
CHOL/HDL RATIO: 3
CHOLESTEROL: 155 mg/dL (ref 0–200)
HDL: 47.5 mg/dL (ref >39.00)
NonHDL: 107.5
Triglycerides: 294 mg/dL — ABNORMAL HIGH (ref 0.0–149.0)
VLDL: 58.8 mg/dL — AB (ref 0.0–40.0)

## 2014-08-09 LAB — HEMOGLOBIN A1C: Hgb A1c MFr Bld: 5.4 % (ref 4.6–6.5)

## 2014-08-09 LAB — PSA: PSA: 1.19 ng/mL (ref 0.10–4.00)

## 2014-08-09 LAB — TSH: TSH: 3.91 u[IU]/mL (ref 0.35–4.50)

## 2014-08-09 MED ORDER — BISOPROLOL-HYDROCHLOROTHIAZIDE 10-6.25 MG PO TABS: 1.0000 | ORAL_TABLET | Freq: Every day | ORAL | Status: DC

## 2014-08-09 NOTE — Telephone Encounter (Signed)
Please let him know I was backed up today and didn't get a chance to see phone messages until after hours. Apologize for me that i was unable to get his message or see him today and thank him for seeing Dr. Regis Bill

## 2014-08-09 NOTE — Telephone Encounter (Signed)
FYI

## 2014-08-09 NOTE — Patient Instructions (Addendum)
Follow up with Dr. Yong Channel . As discussed .  Many issues to evaluate.  Can add  Increase dose of current med   consdier   Ace inhibitor for additional control . Marland Kitchen Home BP  Monitor. Take blood pressure readings twice a day for 7- 10 day episoeds  and then periodically .To ensure below 140/90   Send  Or bring in readings.     But not if makes you anxiojus  considier getting sleep evaluation.   Can get cpx labs  Consider getting  Glucose tolerance test   But would be ordered by  Dr Yong Channel.      DASH Eating Plan DASH stands for "Dietary Approaches to Stop Hypertension." The DASH eating plan is a healthy eating plan that has been shown to reduce high blood pressure (hypertension). Additional health benefits may include reducing the risk of type 2 diabetes mellitus, heart disease, and stroke. The DASH eating plan may also help with weight loss. WHAT DO I NEED TO KNOW ABOUT THE DASH EATING PLAN? For the DASH eating plan, you will follow these general guidelines:  Choose foods with a percent daily value for sodium of less than 5% (as listed on the food label).  Use salt-free seasonings or herbs instead of table salt or sea salt.  Check with your health care provider or pharmacist before using salt substitutes.  Eat lower-sodium products, often labeled as "lower sodium" or "no salt added."  Eat fresh foods.  Eat more vegetables, fruits, and low-fat dairy products.  Choose whole grains. Look for the word "whole" as the first word in the ingredient list.  Choose fish and skinless chicken or [place] more often than red meat. Limit fish, poultry, and meat to 6 oz (170 g) each day.  Limit sweets, desserts, sugars, and sugary drinks.  Choose heart-healthy fats.  Limit cheese to 1 oz (28 g) per day.  Eat more home-cooked food and less restaurant, buffet, and fast food.  Limit fried foods.  Cook foods using methods other than frying.  Limit canned vegetables. If you do use them, rinse them  well to decrease the sodium.  When eating at a restaurant, ask that your food be prepared with less salt, or no salt if possible. WHAT FOODS CAN I EAT? Seek help from a dietitian for individual calorie needs. Grains Whole grain or whole wheat bread. Brown rice. Whole grain or whole wheat pasta. Quinoa, bulgur, and whole grain cereals. Low-sodium cereals. Corn or whole wheat flour tortillas. Whole grain cornbread. Whole grain crackers. Low-sodium crackers. Vegetables Fresh or frozen vegetables (raw, steamed, roasted, or grilled). Low-sodium or reduced-sodium tomato and vegetable juices. Low-sodium or reduced-sodium tomato sauce and paste. Low-sodium or reduced-sodium canned vegetables.  Fruits All fresh, canned (in natural juice), or frozen fruits. Meat and Other Protein Products Ground beef (85% or leaner), grass-fed beef, or beef trimmed of fat. Skinless chicken or [place]. Ground chicken or [place]. Pork trimmed of fat. All fish and seafood. Eggs. Dried beans, peas, or lentils. Unsalted nuts and seeds. Unsalted canned beans. Dairy Low-fat dairy products, such as skim or 1% milk, 2% or reduced-fat cheeses, low-fat ricotta or cottage cheese, or plain low-fat yogurt. Low-sodium or reduced-sodium cheeses. Fats and Oils Tub margarines without trans fats. Light or reduced-fat mayonnaise and salad dressings (reduced sodium). Avocado. Safflower, olive, or canola oils. Natural peanut or almond butter. Other Unsalted popcorn and pretzels. The items listed above may not be a complete list of recommended foods or beverages. Contact your  dietitian for more options. WHAT FOODS ARE NOT RECOMMENDED? Grains White bread. White pasta. White rice. Refined cornbread. Bagels and croissants. Crackers that contain trans fat. Vegetables Creamed or fried vegetables. Vegetables in a cheese sauce. Regular canned vegetables. Regular canned tomato sauce and paste. Regular tomato and vegetable juices. Fruits Dried  fruits. Canned fruit in light or heavy syrup. Fruit juice. Meat and Other Protein Products Fatty cuts of meat. Ribs, chicken wings, bacon, sausage, bologna, salami, chitterlings, fatback, hot dogs, bratwurst, and packaged luncheon meats. Salted nuts and seeds. Canned beans with salt. Dairy Whole or 2% milk, cream, half-and-half, and cream cheese. Whole-fat or sweetened yogurt. Full-fat cheeses or blue cheese. Nondairy creamers and whipped toppings. Processed cheese, cheese spreads, or cheese curds. Condiments Onion and garlic salt, seasoned salt, table salt, and sea salt. Canned and packaged gravies. Worcestershire sauce. Tartar sauce. Barbecue sauce. Teriyaki sauce. Soy sauce, including reduced sodium. Steak sauce. Fish sauce. Oyster sauce. Cocktail sauce. Horseradish. Ketchup and mustard. Meat flavorings and tenderizers. Bouillon cubes. Hot sauce. Tabasco sauce. Marinades. Taco seasonings. Relishes. Fats and Oils Butter, stick margarine, lard, shortening, ghee, and bacon fat. Coconut, palm kernel, or palm oils. Regular salad dressings. Other Pickles and olives. Salted popcorn and pretzels. The items listed above may not be a complete list of foods and beverages to avoid. Contact your dietitian for more information. WHERE CAN I FIND MORE INFORMATION? National Heart, Lung, and Blood Institute: travelstabloid.com Document Released: 04/01/2011 Document Revised: 08/27/2013 Document Reviewed: 02/14/2013 [facility] Patient Information 2015 [place], [place]. This information is not intended to replace advice given to you by your health care provider. Make sure you discuss any questions you have with your health care provider.    Why follow it? Research shows. . Those who follow the Mediterranean diet have a reduced risk of heart disease  . The diet is associated with a reduced incidence of Parkinson's and Alzheimer's diseases . People following the diet may have longer life  expectancies and lower rates of chronic diseases  . The Dietary Guidelines for Americans recommends the Mediterranean diet as an eating plan to promote health and prevent disease  What Is the Mediterranean Diet?  . Healthy eating plan based on typical foods and recipes of Mediterranean-style cooking . The diet is primarily a plant based diet; these foods should make up a majority of meals   Starches - Plant based foods should make up a majority of meals - They are an important sources of vitamins, minerals, energy, antioxidants, and fiber - Choose whole grains, foods high in fiber and minimally processed items  - Typical grain sources include wheat, oats, barley, corn, brown rice, bulgar, farro, millet, polenta, couscous  - Various types of beans include chickpeas, lentils, fava beans, black beans, white beans   Fruits  Veggies - Large quantities of antioxidant rich fruits & veggies; 6 or more servings  - Vegetables can be eaten raw or lightly drizzled with oil and cooked  - Vegetables common to the traditional Mediterranean Diet include: artichokes, arugula, beets, broccoli, brussel sprouts, cabbage, carrots, celery, collard greens, cucumbers, eggplant, kale, leeks, lemons, lettuce, mushrooms, okra, onions, peas, peppers, potatoes, pumpkin, radishes, rutabaga, shallots, spinach, sweet potatoes, turnips, zucchini - Fruits common to the Mediterranean Diet include: apples, apricots, avocados, cherries, clementines, dates, figs, grapefruits, grapes, melons, nectarines, oranges, peaches, pears, pomegranates, strawberries, tangerines  Fats - Replace butter and margarine with healthy oils, such as olive oil, canola oil, and tahini  - Limit nuts to no more than a  handful a day  - Nuts include walnuts, almonds, pecans, pistachios, pine nuts  - Limit or avoid candied, honey roasted or heavily salted nuts - Olives are central to the Mediterranean diet - can be eaten whole or used in a variety of dishes    Meats Protein - Limiting red meat: no more than a few times a month - When eating red meat: choose lean cuts and keep the portion to the size of deck of cards - Eggs: approx. 0 to 4 times a week  - Fish and lean poultry: at least 2 a week  - Healthy protein sources include, chicken, [place], lean beef, lamb - Increase intake of seafood such as tuna, salmon, trout, mackerel, shrimp, scallops - Avoid or limit high fat processed meats such as sausage and bacon  Dairy - Include moderate amounts of low fat dairy products  - Focus on healthy dairy such as fat free yogurt, skim milk, low or reduced fat cheese - Limit dairy products higher in fat such as whole or 2% milk, cheese, ice cream  Alcohol - Moderate amounts of red wine is ok  - No more than 5 oz daily for women (all ages) and men older than age 25  - No more than 10 oz of wine daily for men younger than 21  Other - Limit sweets and other desserts  - Use herbs and spices instead of salt to flavor foods  - Herbs and spices common to the traditional Mediterranean Diet include: basil, bay leaves, chives, cloves, cumin, fennel, garlic, lavender, marjoram, mint, oregano, parsley, pepper, rosemary, sage, savory, sumac, tarragon, thyme   It's not just a diet, it's a lifestyle:  . The Mediterranean diet includes lifestyle factors typical of those in the region  . Foods, drinks and meals are best eaten with others and savored . Daily physical activity is important for overall good health . This could be strenuous exercise like running and aerobics . This could also be more leisurely activities such as walking, housework, yard-work, or taking the stairs . Moderation is the key; a balanced and healthy diet accommodates most foods and drinks . Consider portion sizes and frequency of consumption of certain foods   Meal Ideas & Options:  . Breakfast:  o Whole wheat toast or whole wheat English muffins with peanut butter & hard boiled egg o Steel cut  oats topped with apples & cinnamon and skim milk  o Fresh fruit: banana, strawberries, melon, berries, peaches  o Smoothies: strawberries, bananas, greek yogurt, peanut butter o Low fat greek yogurt with blueberries and granola  o Egg white omelet with spinach and mushrooms o Breakfast couscous: whole wheat couscous, apricots, skim milk, cranberries  . Sandwiches:  o Hummus and grilled vegetables (peppers, zucchini, squash) on whole wheat bread   o Grilled chicken on whole wheat pita with lettuce, tomatoes, cucumbers or tzatziki  o Tuna salad on whole wheat bread: tuna salad made with greek yogurt, olives, red peppers, capers, green onions o Garlic rosemary lamb pita: lamb sauted with garlic, rosemary, salt & pepper; add lettuce, cucumber, greek yogurt to pita - flavor with lemon juice and black pepper  . Seafood:  o Mediterranean grilled salmon, seasoned with garlic, basil, parsley, lemon juice and black pepper o Shrimp, lemon, and spinach whole-grain pasta salad made with low fat greek yogurt  o Seared scallops with lemon orzo  o Seared tuna steaks seasoned salt, pepper, coriander topped with tomato mixture of olives, tomatoes, olive oil, minced  garlic, parsley, green onions and cappers  . Meats:  o Herbed greek chicken salad with kalamata olives, cucumber, feta  o Red bell peppers stuffed with spinach, bulgur, lean ground beef (or lentils) & topped with feta   o Kebabs: skewers of chicken, tomatoes, onions, zucchini, squash  o [place] burgers: made with red onions, mint, dill, lemon juice, feta cheese topped with roasted red peppers . Vegetarian o Cucumber salad: cucumbers, artichoke hearts, celery, red onion, feta cheese, tossed in olive oil & lemon juice  o Hummus and whole grain pita points with a greek salad (lettuce, tomato, feta, olives, cucumbers, red onion) o Lentil soup with celery, carrots made with vegetable broth, garlic, salt and pepper  o Tabouli salad: parsley, bulgur,  mint, scallions, cucumbers, tomato, radishes, lemon juice, olive oil, salt and pepper.

## 2014-08-09 NOTE — Progress Notes (Signed)
Pre visit review using our clinic review tool, if applicable. No additional management support is needed unless otherwise documented below in the visit note.  Chief Complaint  Patient presents with  . Hypertension    doesn feel well for months    HPI: Patient Frank Joseph  comes in today for SDA for  Acute visit/ problem evaluation. Here with wife.  PCP  Is Dr Wyvonne Lenz  Dx HT  At last check in fall 15  felt to have white  coat effect .   Was supposed to fu in 6 months or as needed  and canceled that appt   Has cpx appt in MAY with labs  Now comes in for acute appt because of concern about bp up high. And not feeling well " Foggy headed "over the past 2-3 months  And checked bp and was up to 160 at one point  Brings in home monitor also .  othertime in 120 130  Wife reports tender to be anxious  About health although he says not that bad  Trying to eat better Cut out  splenda and eating better  Foggy headed and eating better.   But d but recall diet eating out [place] and [place]  Cuts out 1/2 of the bread   May have "hypoglycemia "   Fasted last night  To poss get blood test   Gets sweaty and jittery and feels better Feels better  After eating.    "Has never felt right  Since diuretic . " placed last year per dr J  exercises and has no sx and feels good no cp sob syncope. Sleeps ok. But never reflreshed   Has a mouth guard  And concern about some   Some snoring per wife may have stopped breathing at one point Drinks 2-3 beers or more a night poss too much but says etoh not make a difference in his sleep  Has cpx and lab appt planned in MAY ROS: See pertinent positives and negatives per HPI.  Past Medical History  Diagnosis Date  . Headache(784.0)   . Sinusitis   . Hyperlipidemia   . Hypertension     Family History  Problem Relation Age of Onset  . Hypertension Mother   . Glaucoma Father     History   Social History  . Marital Status: Married   Spouse Name: N/A  . Number of Children: N/A  . Years of Education: N/A   Social History Main Topics  . Smoking status: Never Smoker   . Smokeless tobacco: Not on file  . Alcohol Use: Yes     Comment: 2-24  . Drug Use: No  . Sexual Activity: Not on file   Other Topics Concern  . None   Social History Narrative   Married 35 years in 2015. 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    Played baseball at [employer]. Wife went to [employer].       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 aspirin 81 MG tablet Take 81 mg by mouth daily.  Marland Kitchen ezetimibe-simvastatin (VYTORIN) 10-20 MG per tablet Take 1 tablet by mouth at bedtime.  Marland Kitchen ezetimibe-simvastatin (VYTORIN) 10-20 MG per tablet Take 1 tablet by mouth at bedtime.  . multivitamin (THERAGRAN) per tablet Take  1 tablet by mouth daily.    . SUMAtriptan (IMITREX) 25 MG tablet Take 1 tablet (25 mg total) by mouth every 2 (two) hours as needed.  . triamcinolone (NASACORT) 55 MCG/ACT AERO nasal inhaler Place 2 sprays into the nose daily.  . [DISCONTINUED] bisoprolol-hydrochlorothiazide (ZIAC) 5-6.25 MG per tablet Take 1 tablet by mouth daily.  . bisoprolol-hydrochlorothiazide (ZIAC) 10-6.25 MG per tablet Take 1 tablet by mouth daily.  . [DISCONTINUED] amoxicillin-clavulanate (AUGMENTIN) 875-125 MG per tablet Take 1 tablet by mouth 2 (two) times daily.  . [DISCONTINUED] NASONEX 50 MCG/ACT nasal spray INHALE 2 SPRAYS EACH NOSTRIL EVERY DAY    EXAM:  BP 146/94 mmHg  Temp(Src) 98.2 F (36.8 C) (Oral)  Wt 199 lb 3.2 oz (90.357 kg)  Body mass index is 27.01 kg/(m^2).  GENERAL: vitals reviewed and listed above, alert, oriented, appears well hydrated and in no acute distress appears anxious   With increase body motions  Leg swinging  No  tremor  Nl affecgt otherwise  HEENT: atraumatic, conjunctiva  clear, no obvious abnormalities on inspection of external nose and ears OP : no lesion edema or exudate  NECK: no obvious masses on inspection palpation  No bruits  LUNGS: clear to auscultation bilaterally, no wheezes, rales or rhonchi, good air movement CV: HRRR, no clubbing cyanosis or  peripheral edema nl cap refill  Abdomen:  Sof,t normal bowel sounds without hepatosplenomegaly, no guarding rebound or masses no CVA tenderness MS: moves all extremities without noticeable focal  abnormality BP readigns repeat right large 156/96 pulse in 78 range  BP Readings from Last 3 Encounters:  08/09/14 146/94  05/01/14 140/80  03/18/14 140/84   Wt Readings from Last 3 Encounters:  08/09/14 199 lb 3.2 oz (90.357 kg)  05/01/14 201 lb (91.173 kg)  03/18/14 200 lb (90.719 kg)    Machine 158/99 and 164    Repeat Office cuff  BP Readings from Last 3 Encounters:  08/09/14 146/94  05/01/14 140/80  03/18/14 140/84    ASSESSMENT AND PLAN:  Discussed the following assessment and plan:  Other fatigue - Plan: Hemoglobin A1c, CBC with Differential, PSA, TSH, Lipid Panel, Hepatic Function Panel, POC Urinalysis Dipstick, Basic Metabolic Panel, LDL cholesterol, direct  Feeling unwell - Plan: Hemoglobin A1c, CBC with Differential, PSA, TSH, Lipid Panel, Hepatic Function Panel, POC Urinalysis Dipstick, Basic Metabolic Panel, LDL cholesterol, direct  Essential hypertension - Plan: Hemoglobin A1c, CBC with Differential, PSA, TSH, Lipid Panel, Hepatic Function Panel, POC Urinalysis Dipstick, Basic Metabolic Panel, LDL cholesterol, direct  Unrefreshed by sleep - Plan: Hemoglobin A1c, CBC with Differential, PSA, TSH, Lipid Panel, Hepatic Function Panel, POC Urinalysis Dipstick, Basic Metabolic Panel, LDL cholesterol, direct  Examination of, laboratory - Plan: Hemoglobin A1c, CBC with Differential, PSA, TSH, Lipid Panel, Hepatic Function Panel, POC  Urinalysis Dipstick, Basic Metabolic Panel, LDL cholesterol, direct  Preventive measure - Plan: CBC with Differential, PSA, TSH, Lipid Panel, Hepatic Function Panel, POC Urinalysis Dipstick, Basic Metabolic Panel, LDL cholesterol, direct  Hyperlipidemia Consider prediabetic state with hhis sx  And diet needs to be even better  Do cpx labs today  He is fasting consideration o gtt and nutritional referral Poss sleep issues  Disc  etoh contributor to sleep interference etc . May have se of his bp med but for now increase  Dose  consdier adding ace or other group and may have fewer side effects.   -Patient advised to return or notify health care team  if symptoms worsen ,persist or new concerns  arise. Total visit 40 mins > 50% spent counseling and coordinating care  Disc  strategy and follow up.     Patient Instructions   Follow up with Dr. Yong Channel . As discussed .  Many issues to evaluate.  Can add  Increase dose of current med   consdier   Ace inhibitor for additional control . Marland Kitchen Home BP  Monitor. Take blood pressure readings twice a day for 7- 10 day episoeds  and then periodically .To ensure below 140/90   Send  Or bring in readings.     But not if makes you anxiojus  considier getting sleep evaluation.   Can get cpx labs  Consider getting  Glucose tolerance test   But would be ordered by  Dr Yong Channel.      DASH Eating Plan DASH stands for "Dietary Approaches to Stop Hypertension." The DASH eating plan is a healthy eating plan that has been shown to reduce high blood pressure (hypertension). Additional health benefits may include reducing the risk of type 2 diabetes mellitus, heart disease, and stroke. The DASH eating plan may also help with weight loss. WHAT DO I NEED TO KNOW ABOUT THE DASH EATING PLAN? For the DASH eating plan, you will follow these general guidelines:  Choose foods with a percent daily value for sodium of less than 5% (as listed on the food label).  Use salt-free  seasonings or herbs instead of table salt or sea salt.  Check with your health care provider or pharmacist before using salt substitutes.  Eat lower-sodium products, often labeled as "lower sodium" or "no salt added."  Eat fresh foods.  Eat more vegetables, fruits, and low-fat dairy products.  Choose whole grains. Look for the word "whole" as the first word in the ingredient list.  Choose fish and skinless chicken or [place] more often than red meat. Limit fish, poultry, and meat to 6 oz (170 g) each day.  Limit sweets, desserts, sugars, and sugary drinks.  Choose heart-healthy fats.  Limit cheese to 1 oz (28 g) per day.  Eat more home-cooked food and less restaurant, buffet, and fast food.  Limit fried foods.  Cook foods using methods other than frying.  Limit canned vegetables. If you do use them, rinse them well to decrease the sodium.  When eating at a restaurant, ask that your food be prepared with less salt, or no salt if possible. WHAT FOODS CAN I EAT? Seek help from a dietitian for individual calorie needs. Grains Whole grain or whole wheat bread. Brown rice. Whole grain or whole wheat pasta. Quinoa, bulgur, and whole grain cereals. Low-sodium cereals. Corn or whole wheat flour tortillas. Whole grain cornbread. Whole grain crackers. Low-sodium crackers. Vegetables Fresh or frozen vegetables (raw, steamed, roasted, or grilled). Low-sodium or reduced-sodium tomato and vegetable juices. Low-sodium or reduced-sodium tomato sauce and paste. Low-sodium or reduced-sodium canned vegetables.  Fruits All fresh, canned (in natural juice), or frozen fruits. Meat and Other Protein Products Ground beef (85% or leaner), grass-fed beef, or beef trimmed of fat. Skinless chicken or [place]. Ground chicken or [place]. Pork trimmed of fat. All fish and seafood. Eggs. Dried beans, peas, or lentils. Unsalted nuts and seeds. Unsalted canned beans. Dairy Low-fat dairy products, such as skim or  1% milk, 2% or reduced-fat cheeses, low-fat ricotta or cottage cheese, or plain low-fat yogurt. Low-sodium or reduced-sodium cheeses. Fats and Oils Tub margarines without trans fats. Light or reduced-fat mayonnaise and salad dressings (reduced sodium). Avocado. Safflower, olive, or canola  oils. Natural peanut or almond butter. Other Unsalted popcorn and pretzels. The items listed above may not be a complete list of recommended foods or beverages. Contact your dietitian for more options. WHAT FOODS ARE NOT RECOMMENDED? Grains White bread. White pasta. White rice. Refined cornbread. Bagels and croissants. Crackers that contain trans fat. Vegetables Creamed or fried vegetables. Vegetables in a cheese sauce. Regular canned vegetables. Regular canned tomato sauce and paste. Regular tomato and vegetable juices. Fruits Dried fruits. Canned fruit in light or heavy syrup. Fruit juice. Meat and Other Protein Products Fatty cuts of meat. Ribs, chicken wings, bacon, sausage, bologna, salami, chitterlings, fatback, hot dogs, bratwurst, and packaged luncheon meats. Salted nuts and seeds. Canned beans with salt. Dairy Whole or 2% milk, cream, half-and-half, and cream cheese. Whole-fat or sweetened yogurt. Full-fat cheeses or blue cheese. Nondairy creamers and whipped toppings. Processed cheese, cheese spreads, or cheese curds. Condiments Onion and garlic salt, seasoned salt, table salt, and sea salt. Canned and packaged gravies. Worcestershire sauce. Tartar sauce. Barbecue sauce. Teriyaki sauce. Soy sauce, including reduced sodium. Steak sauce. Fish sauce. Oyster sauce. Cocktail sauce. Horseradish. Ketchup and mustard. Meat flavorings and tenderizers. Bouillon cubes. Hot sauce. Tabasco sauce. Marinades. Taco seasonings. Relishes. Fats and Oils Butter, stick margarine, lard, shortening, ghee, and bacon fat. Coconut, palm kernel, or palm oils. Regular salad dressings. Other Pickles and olives. Salted popcorn  and pretzels. The items listed above may not be a complete list of foods and beverages to avoid. Contact your dietitian for more information. WHERE CAN I FIND MORE INFORMATION? National Heart, Lung, and Blood Institute: travelstabloid.com Document Released: 04/01/2011 Document Revised: 08/27/2013 Document Reviewed: 02/14/2013 [facility] Patient Information 2015 [place], [place]. This information is not intended to replace advice given to you by your health care provider. Make sure you discuss any questions you have with your health care provider.    Why follow it? Research shows. . Those who follow the Mediterranean diet have a reduced risk of heart disease  . The diet is associated with a reduced incidence of Parkinson's and Alzheimer's diseases . People following the diet may have longer life expectancies and lower rates of chronic diseases  . The Dietary Guidelines for Americans recommends the Mediterranean diet as an eating plan to promote health and prevent disease  What Is the Mediterranean Diet?  . Healthy eating plan based on typical foods and recipes of Mediterranean-style cooking . The diet is primarily a plant based diet; these foods should make up a majority of meals   Starches - Plant based foods should make up a majority of meals - They are an important sources of vitamins, minerals, energy, antioxidants, and fiber - Choose whole grains, foods high in fiber and minimally processed items  - Typical grain sources include wheat, oats, barley, corn, brown rice, bulgar, farro, millet, polenta, couscous  - Various types of beans include chickpeas, lentils, fava beans, black beans, white beans   Fruits  Veggies - Large quantities of antioxidant rich fruits & veggies; 6 or more servings  - Vegetables can be eaten raw or lightly drizzled with oil and cooked  - Vegetables common to the traditional Mediterranean Diet include: artichokes, arugula, beets,  broccoli, brussel sprouts, cabbage, carrots, celery, collard greens, cucumbers, eggplant, kale, leeks, lemons, lettuce, mushrooms, okra, onions, peas, peppers, potatoes, pumpkin, radishes, rutabaga, shallots, spinach, sweet potatoes, turnips, zucchini - Fruits common to the Mediterranean Diet include: apples, apricots, avocados, cherries, clementines, dates, figs, grapefruits, grapes, melons, nectarines, oranges, peaches, pears, pomegranates, strawberries,  tangerines  Fats - Replace butter and margarine with healthy oils, such as olive oil, canola oil, and tahini  - Limit nuts to no more than a handful a day  - Nuts include walnuts, almonds, pecans, pistachios, pine nuts  - Limit or avoid candied, honey roasted or heavily salted nuts - Olives are central to the Mediterranean diet - can be eaten whole or used in a variety of dishes   Meats Protein - Limiting red meat: no more than a few times a month - When eating red meat: choose lean cuts and keep the portion to the size of deck of cards - Eggs: approx. 0 to 4 times a week  - Fish and lean poultry: at least 2 a week  - Healthy protein sources include, chicken, [place], lean beef, lamb - Increase intake of seafood such as tuna, salmon, trout, mackerel, shrimp, scallops - Avoid or limit high fat processed meats such as sausage and bacon  Dairy - Include moderate amounts of low fat dairy products  - Focus on healthy dairy such as fat free yogurt, skim milk, low or reduced fat cheese - Limit dairy products higher in fat such as whole or 2% milk, cheese, ice cream  Alcohol - Moderate amounts of red wine is ok  - No more than 5 oz daily for women (all ages) and men older than age 28  - No more than 10 oz of wine daily for men younger than 15  Other - Limit sweets and other desserts  - Use herbs and spices instead of salt to flavor foods  - Herbs and spices common to the traditional Mediterranean Diet include: basil, bay leaves, chives, cloves, cumin,  fennel, garlic, lavender, marjoram, mint, oregano, parsley, pepper, rosemary, sage, savory, sumac, tarragon, thyme   It's not just a diet, it's a lifestyle:  . The Mediterranean diet includes lifestyle factors typical of those in the region  . Foods, drinks and meals are best eaten with others and savored . Daily physical activity is important for overall good health . This could be strenuous exercise like running and aerobics . This could also be more leisurely activities such as walking, housework, yard-work, or taking the stairs . Moderation is the key; a balanced and healthy diet accommodates most foods and drinks . Consider portion sizes and frequency of consumption of certain foods   Meal Ideas & Options:  . Breakfast:  o Whole wheat toast or whole wheat English muffins with peanut butter & hard boiled egg o Steel cut oats topped with apples & cinnamon and skim milk  o Fresh fruit: banana, strawberries, melon, berries, peaches  o Smoothies: strawberries, bananas, greek yogurt, peanut butter o Low fat greek yogurt with blueberries and granola  o Egg white omelet with spinach and mushrooms o Breakfast couscous: whole wheat couscous, apricots, skim milk, cranberries  . Sandwiches:  o Hummus and grilled vegetables (peppers, zucchini, squash) on whole wheat bread   o Grilled chicken on whole wheat pita with lettuce, tomatoes, cucumbers or tzatziki  o Tuna salad on whole wheat bread: tuna salad made with greek yogurt, olives, red peppers, capers, green onions o Garlic rosemary lamb pita: lamb sauted with garlic, rosemary, salt & pepper; add lettuce, cucumber, greek yogurt to pita - flavor with lemon juice and black pepper  . Seafood:  o Mediterranean grilled salmon, seasoned with garlic, basil, parsley, lemon juice and black pepper o Shrimp, lemon, and spinach whole-grain pasta salad made with low fat greek  yogurt  o Seared scallops with lemon orzo  o Seared tuna steaks seasoned salt,  pepper, coriander topped with tomato mixture of olives, tomatoes, olive oil, minced garlic, parsley, green onions and cappers  . Meats:  o Herbed greek chicken salad with kalamata olives, cucumber, feta  o Red bell peppers stuffed with spinach, bulgur, lean ground beef (or lentils) & topped with feta   o Kebabs: skewers of chicken, tomatoes, onions, zucchini, squash  o [place] burgers: made with red onions, mint, dill, lemon juice, feta cheese topped with roasted red peppers . Vegetarian o Cucumber salad: cucumbers, artichoke hearts, celery, red onion, feta cheese, tossed in olive oil & lemon juice  o Hummus and whole grain pita points with a greek salad (lettuce, tomato, feta, olives, cucumbers, red onion) o Lentil soup with celery, carrots made with vegetable broth, garlic, salt and pepper  o Tabouli salad: parsley, bulgur, mint, scallions, cucumbers, tomato, radishes, lemon juice, olive oil, salt and pepper.          Standley Brooking. Bradrick Kamau M.D.

## 2014-08-12 NOTE — Telephone Encounter (Signed)
Pt states he is doing better and states no apology needed and state bp is doing better and Dr. Regis Bill increased his bp meds and it seems to be working ok.

## 2014-09-09 ENCOUNTER — Other Ambulatory Visit: Payer: BC Managed Care – PPO

## 2014-09-16 ENCOUNTER — Encounter: Payer: Self-pay | Admitting: Family Medicine

## 2014-09-16 ENCOUNTER — Ambulatory Visit (INDEPENDENT_AMBULATORY_CARE_PROVIDER_SITE_OTHER): Payer: BLUE CROSS/BLUE SHIELD | Admitting: Family Medicine

## 2014-09-16 VITALS — BP 110/70 | HR 70 | Temp 98.1°F | Wt 198.0 lb

## 2014-09-16 DIAGNOSIS — Z0000 Encounter for general adult medical examination without abnormal findings: Secondary | ICD-10-CM

## 2014-09-16 NOTE — Progress Notes (Signed)
Frank Reddish, MD Phone: [phone]  Subjective:  Patient presents today for their annual physical. Chief complaint-noted.   Fatigue at last visit has improved with cutting out some caffeine- thinks he was rather dependent on it. Now just once a day at lunch.   Still Snoring some, denies nodding off or daytime sleepiness  2-3 beers a night, advised to cut to 1-2 beer a night.   LDL looks great, trig still high- advised increased activity. On vytorin  BP controlled on ziac  Has imitex for headaches-not regularly having to use  ROS- full  review of systems was completed and negative including pertinent negatives: no chest pain, shortness of breath, nausea, vomiting, left arm pain  The following were reviewed and entered/updated in epic: Past Medical History  Diagnosis Date  . Headache(784.0)   . Sinusitis   . Hyperlipidemia   . Hypertension    Patient Active Problem List   Diagnosis Date Noted  . ESSENTIAL HYPERTENSION 12/09/2009    Priority: Medium  . HYPERLIPIDEMIA 03/07/2007    Priority: Medium  . Headache(784.0) 12/08/2006    Priority: Medium  . Seasonal allergies 01/04/2014    Priority: Low  . Anxiety state, unspecified 05/25/2010    Priority: Low   Past Surgical History  Procedure Laterality Date  . Colonscopy      Family History  Problem Relation Age of Onset  . Hypertension Mother   . Glaucoma Father     Medications- reviewed and updated Current Outpatient Prescriptions  Medication Sig Dispense Refill  . aspirin 81 MG tablet Take 81 mg by mouth daily.    . bisoprolol-hydrochlorothiazide (ZIAC) 10-6.25 MG per tablet Take 1 tablet by mouth daily. 30 tablet 3  . ezetimibe-simvastatin (VYTORIN) 10-20 MG per tablet Take 1 tablet by mouth at bedtime. 90 tablet 0  . multivitamin (THERAGRAN) per tablet Take 1 tablet by mouth daily.      Marland Kitchen triamcinolone (NASACORT) 55 MCG/ACT AERO nasal inhaler Place 2 sprays into the nose daily.    . SUMAtriptan (IMITREX)  25 MG tablet Take 1 tablet (25 mg total) by mouth every 2 (two) hours as needed. (Patient not taking: Reported on 09/16/2014) 10 tablet 6   Allergies-reviewed and updated No Known Allergies  History   Social History  . Marital Status: Married    Spouse Name: N/A  . Number of Children: N/A  . Years of Education: N/A   Social History Main Topics  . Smoking status: Never Smoker   . Smokeless tobacco: Not on file  . Alcohol Use: Yes     Comment: 2-24  . Drug Use: No  . Sexual Activity: Not on file   Other Topics Concern  . None   Social History Narrative   Married 35 years in 2015. 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    Played baseball at [employer]. Wife went to [employer].       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 Oropharynx normal Neck: no thyromegaly CV: RRR no murmurs rubs or gallops Lungs: CTAB no crackles, wheeze, rhonchi Abdomen: soft/nontender/nondistended/normal bowel sounds. No rebound or guarding.  Rectal: normal tone, normal prostate, no masses or tenderness Ext: no  edema Skin: warm, dry, full skin exam-several seborrheic keratosis, no obvious AK, SCC, BCC, melanoma Neuro: grossly normal, moves all extremities, PERRLA   Assessment/Plan:  59 y.o. male presenting for annual physical.  Health Maintenance counseling: 1. Anticipatory guidance: Patient counseled regarding regular dental exams, wearing seatbelts, wearing sunscreen. Does not see dermatology-skin exam today.  2. Risk factor reduction:  Advised patient of need for regular exercise and diet rich and fruits and vegetables to reduce risk of heart attack and stroke. Weight goal closer to  190.  3. Immunizations/screenings/ancillary studies Health Maintenance Due  Topic Date Due  . HIV Screening - next bloodwork 03/08/1970   6 month f/u BP/weight-set goal closer to 190. 1 year CPE.

## 2014-09-16 NOTE — Patient Instructions (Signed)
Things look great.   Happy to see you in 6 months to check in on blood pressure or 1 year if you prefer for a physical  Next labs let's try to add on for 1x national screening for anyone 18-65 Health Maintenance Due  Topic Date Due  . HIV Screening  03/08/1971  or if you give blood this will cover this requirement

## 2014-10-08 ENCOUNTER — Other Ambulatory Visit: Payer: Self-pay | Admitting: *Deleted

## 2014-10-08 MED ORDER — BISOPROLOL-HYDROCHLOROTHIAZIDE 10-6.25 MG PO TABS: 1.0000 | ORAL_TABLET | Freq: Every day | ORAL | Status: DC

## 2014-10-17 ENCOUNTER — Telehealth: Payer: Self-pay | Admitting: *Deleted

## 2014-10-17 NOTE — Telephone Encounter (Signed)
Patient is aware 

## 2014-10-17 NOTE — Telephone Encounter (Signed)
[facility] - Client [facility] Patient Name: GEOFFRY BANNISTER Gender: Male DOB: 07-Aug-1955 Age: 59 Y 80 M 10 D Return Phone Number: [phone] (Primary) Address: [address] City/State/Zip: [place] [address] Client [facility] - Client Client Site [place] - Day Physician [name] Contact Type Call Call Type Triage / Clinical Relationship To Patient Self Appointment Disposition EMR Appointment Not Necessary Info pasted into Epic No Return Phone Number [phone] (Primary) Chief Complaint Nasal Congestion Initial Comment Caller states he is congested, wants to know what he can take that won't interfere with his bp meds Nurse Assessment Guidelines Guideline Title Affirmed Question Affirmed Notes Nurse Date/Time ([place] Time) Disp. Time Eilene Ghazi Time) Disposition Final User 10/17/2014 11:23:14 AM Clinical Call Yes Mechele Dawley, RN, [facility] Instructions Given Call Event Type User Date / Time Description Comments User: Susanne Borders, RN Date/Time Eilene Ghazi Time): 10/17/2014 11:22:46 AM CALLER STATES THAT HE IS HAVING SOME CLEAR DRAINAGE FROM HIS NOSE AND HE IS WANTING TO KNOW WHAT HE CAN TAKE. HE STATES THAT HE IS ON A BETA BLOCKER AND DIURETIC. INFORMED HIM THAT THE BEST CONTACT WOULD BE THE PHARMACIST AT THIS POINT. TRIAGE NURSE IS UNABLE TO LOOK IN THE SYSTEM AND SEE WHAT MEDICATIONS HE TAKES. TRIAGE NURSE NEEDS THIS INFORMATION IN ORDER TO COMPARE MEDICATIONS FOR HIM. HE VERBALIZED UNDERSTANDING AND STATES THAT HE WILL CALL THE PHARMACY. WILL CLOSE THIS CALL.

## 2014-10-17 NOTE — Telephone Encounter (Signed)
Left message on machine for patient to return our call 

## 2014-10-17 NOTE — Telephone Encounter (Signed)
He can use a neti pot or saline nasal rinses. He can continue the nasocort he is on as well.

## 2014-10-17 NOTE — Telephone Encounter (Signed)
See below

## 2014-11-06 ENCOUNTER — Other Ambulatory Visit: Payer: Self-pay | Admitting: Internal Medicine

## 2014-11-06 NOTE — Telephone Encounter (Signed)
Request denied.  Filled on 10/08/14 for six months

## 2014-11-07 ENCOUNTER — Ambulatory Visit (INDEPENDENT_AMBULATORY_CARE_PROVIDER_SITE_OTHER): Payer: BLUE CROSS/BLUE SHIELD | Admitting: Internal Medicine

## 2014-11-07 ENCOUNTER — Encounter: Payer: Self-pay | Admitting: Internal Medicine

## 2014-11-07 VITALS — BP 170/100 | HR 70 | Temp 98.1°F | Resp 20 | Ht 72.0 in | Wt 200.0 lb

## 2014-11-07 DIAGNOSIS — J302 Other seasonal allergic rhinitis: Secondary | ICD-10-CM | POA: Diagnosis not present

## 2014-11-07 DIAGNOSIS — I10 Essential (primary) hypertension: Secondary | ICD-10-CM | POA: Diagnosis not present

## 2014-11-07 NOTE — Progress Notes (Signed)
Pre visit review using our clinic review tool, if applicable. No additional management support is needed unless otherwise documented below in the visit note. 

## 2014-11-07 NOTE — Progress Notes (Signed)
   Subjective:    Patient ID: Frank Joseph, male    DOB: 1955/06/20, 59 y.o.   MRN: [id number]  HPI  58 year old patient who has history of essential hypertension and seasonal allergic rhinitis.  About 4 weeks ago he began having rhinorrhea.  More recently he has had some head congestion, sinus pressure, mild headaches. He has treated hypertension.  BP Readings from Last 3 Encounters:  11/07/14 170/100  09/16/14 110/70  08/09/14 146/94      Review of Systems  Constitutional: Negative for fever, chills, appetite change and fatigue.  HENT: Positive for congestion, postnasal drip, rhinorrhea and sinus pressure. Negative for dental problem, ear pain, hearing loss, sore throat, tinnitus, trouble swallowing and voice change.   Eyes: Negative for pain, discharge and visual disturbance.  Respiratory: Negative for cough, chest tightness, wheezing and stridor.   Cardiovascular: Negative for chest pain, palpitations and leg swelling.  Gastrointestinal: Negative for nausea, vomiting, abdominal pain, diarrhea, constipation, blood in stool and abdominal distention.  Genitourinary: Negative for urgency, hematuria, flank pain, discharge, difficulty urinating and genital sores.  Musculoskeletal: Negative for myalgias, back pain, joint swelling, arthralgias, gait problem and neck stiffness.  Skin: Negative for rash.  Neurological: Positive for headaches. Negative for dizziness, syncope, speech difficulty, weakness and numbness.  Hematological: Negative for adenopathy. Does not bruise/bleed easily.  Psychiatric/Behavioral: Negative for behavioral problems and dysphoric mood. The patient is not nervous/anxious.        Objective:   Physical Exam  Constitutional: He is oriented to person, place, and time. He appears well-developed.  Anxious Blood pressure still 170 over 100  HENT:  Head: Normocephalic.  Right Ear: External ear normal.  Left Ear: External ear normal.  Eyes: Conjunctivae and  EOM are normal.  Neck: Normal range of motion.  Cardiovascular: Normal rate, regular rhythm and normal heart sounds.   Pulmonary/Chest: Breath sounds normal. No respiratory distress. He has no wheezes. He has no rales.  Abdominal: Bowel sounds are normal.  Musculoskeletal: Normal range of motion. He exhibits no edema or tenderness.  Lymphadenopathy:    He has no cervical adenopathy.  Neurological: He is alert and oriented to person, place, and time.  Psychiatric: He has a normal mood and affect. His behavior is normal.          Assessment & Plan:    viral URI with sinus congestion.  Will add the saline irrigation, and a antihistamine.  Continue nasal steroids  Anxiety.  Blood pressure elevated today and quite anxious.  Blood pressure low normal at the time of this exam 2 months ago  Essential hypertension.  Will continue to observe on present therapy.  Will call if blood pressures consistently greater than 140 over 90

## 2014-11-07 NOTE — Patient Instructions (Addendum)
Use saline irrigation, warm  moist compresses and over-the-counter decongestants only as directed.  Call if there is no improvement in 5 to 7 days, or sooner if you develop increasing pain, fever, or any new symptoms.  HOME CARE INSTRUCTIONS  Drink plenty of water. Water helps thin the mucus so your sinuses can drain more easily.  Use a humidifier.  Inhale steam 3 to 4 times a day (for example, sit in the bathroom with the shower running).  Apply a warm, moist washcloth to your face 3 to 4 times a day, or as directed by your health care provider.  Use saline nasal sprays to help moisten and clean your sinuses.   Limit your sodium (Salt) intake  Please check your blood pressure on a regular basis.  If it is consistently greater than 150/90, please make an office appointment.

## 2014-12-02 ENCOUNTER — Other Ambulatory Visit: Payer: Self-pay | Admitting: Internal Medicine

## 2015-01-02 ENCOUNTER — Other Ambulatory Visit: Payer: Self-pay | Admitting: Internal Medicine

## 2015-01-02 NOTE — Telephone Encounter (Signed)
Filled on 12/03/14 for six months

## 2015-01-22 ENCOUNTER — Ambulatory Visit (INDEPENDENT_AMBULATORY_CARE_PROVIDER_SITE_OTHER): Payer: BLUE CROSS/BLUE SHIELD | Admitting: Family Medicine

## 2015-01-22 ENCOUNTER — Encounter: Payer: Self-pay | Admitting: Family Medicine

## 2015-01-22 VITALS — BP 144/92 | HR 75 | Temp 98.4°F | Wt 201.0 lb

## 2015-01-22 DIAGNOSIS — M7022 Olecranon bursitis, left elbow: Secondary | ICD-10-CM

## 2015-01-22 DIAGNOSIS — Z23 Encounter for immunization: Secondary | ICD-10-CM

## 2015-01-22 DIAGNOSIS — L918 Other hypertrophic disorders of the skin: Secondary | ICD-10-CM

## 2015-01-22 MED ORDER — MELOXICAM 15 MG PO TABS: 15.0000 mg | ORAL_TABLET | Freq: Every day | ORAL | Status: DC

## 2015-01-22 NOTE — Patient Instructions (Addendum)
Flu shot received today.  Bursitis- does not appear infected, just inflammed  Ice 3x a day for 20 minutes. 7 days at least of meloxicam but gave you 15, do not use ibuprofen along with this. You can take tylenol  Return to see [place] if you have redness expanding up or down the arm  Olecranon Bursitis  with Rehab A bursa is a fluid-filled sac that is located between soft tissues (ligaments, tendons, skin) and bones. The purpose of a bursa is to allow the soft tissue to function smoothly, without friction. The olecranon bursa is located between the back of the elbow (olecranon) and the skin. Olecranon bursitis involves inflammation of this bursa, resulting in pain. SYMPTOMS   Pain, tenderness, swelling, warmth, or redness over the back of the elbow.  Reduced range of motion of the affected elbow.  Sometimes, severe pain with movement of the affected elbow.  Crackling sound (crepitation) when the bursa is moved or touched.  Often, painless swelling of the bursa.  Fever (when infected). CAUSES  Olecranon bursitis is often caused by direct hit (trauma) to the elbow. Less commonly, it is due to overuse and/or strenuous exercise that the elbow is not used to. RISK INCREASES WITH:  Sports that require bending or Wann on the elbow (football, volleyball).  Vigorous or repetitive athletic training, or sudden increase or change in activity level (weekend warriors).  Failure to warm up properly before activity.  Poor exercise technique.  Playing on artificial turf. PREVENTION  Avoid injuries and the overuse of muscles whenever possible.  Warm up and stretch properly before activity.  Allow for adequate recovery between workouts.  Maintain physical fitness:  Strength, flexibility, and endurance.  Cardiovascular fitness.  Learn and use proper technique.  Wear properly fitted and padded protective equipment. PROGNOSIS  If treated properly, olecranon bursitis is usually curable  within 2 weeks.  RELATED COMPLICATIONS   Longer healing time, if not properly treated or if not given enough time to heal.  Recurring symptoms that result in a chronic problem.  Joint stiffness with permanent limitation of the affected joint's movement.  Infection of the bursa.  Chronic inflammation or scarring of the bursa. TREATMENT Treatment first involves the use of ice and medicine to reduce pain and inflammation. The use of strengthening and stretching exercises may help reduce pain with activity. These exercises may be performed at home or with a therapist. Elbow pads may be advised to protect the bursa. If symptoms persist despite nonsurgical treatment, a procedure to withdraw fluid from the bursa may be advised. This procedure may be accompanied with an injection of corticosteroids to reduce inflammation. Sometimes, surgery is needed to remove the bursa. MEDICATION  If pain medicine is needed, nonsteroidal anti-inflammatory medicines (aspirin and ibuprofen), or other minor pain relievers (acetaminophen), are often advised.  Do not take pain medicine for 7 days before surgery.  Prescription pain relievers may be given, if your caregiver thinks they are needed. Use only as directed and only as much as you need.  Corticosteroid injections may be given by your caregiver. These injections should be reserved for the most serious cases, because they may only be given a certain number of times. HEAT AND COLD  Cold treatment (icing) should be applied for 10 to 15 minutes every 2 to 3 hours for inflammation and pain, and immediately after activity that aggravates your symptoms. Use ice packs or an ice massage.  Heat treatment may be used before performing stretching and strengthening activities prescribed  by your caregiver, physical therapist, or athletic trainer. Use a heat pack or a warm water soak. SEEK IMMEDIATE MEDICAL CARE IF:   Symptoms get worse or do not improve in 2 weeks,  despite treatment.  Signs of infection develop, including fever of 102 F (38.9 C), increased pain, redness, warmth, or pus draining from the bursa.  New, unexplained symptoms develop. (Drugs used in treatment may produce side effects.) EXERCISES  RANGE OF MOTION (ROM) AND STRETCHING EXERCISES - Olecranon Bursitis These exercises may help you when beginning to rehabilitate your injury. Your symptoms may resolve with or without further involvement from your physician, physical therapist or athletic trainer. While completing these exercises, remember:   Restoring tissue flexibility helps normal motion to return to the joints. This allows healthier, less painful movement and activity.  An effective stretch should be held for at least 30 seconds.  A stretch should never be painful. You should only feel a gentle lengthening or release in the stretched tissue. RANGE OF MOTION - Elbow Flexion, Supine  Lie on your back. Extend your right / left arm into the air, bracing it with your opposite hand. Allow your right / left arm to relax.  Let your elbow bend, allowing your hand to fall slowly toward your chest.  You should feel a gentle stretch along the back of your upper arm and elbow. Your physician, physical therapist or athletic trainer may ask you to hold a __________ hand weight to increase the intensity of this stretch.  Hold for __________ seconds. Slowly return your right / left arm to the upright position. Repeat __________ times. Complete this exercise __________ times per day. STRETCH - Elbow Flexors  Lie on a firm bed or countertop on your back. Be sure that you are in a comfortable position which will allow you to relax your arm muscles.  Place a folded towel under your right / left upper arm, so that your elbow and shoulder are at the same height. Extend your arm; your elbow should not rest on the bed or towel  Allow the weight of your hand to straighten your elbow. Keep your arm  and chest muscles relaxed. Your caregiver may ask you to increase the intensity of your stretch by adding a small wrist or hand weight.  Hold for __________ seconds. You should feel a stretch on the inside of your elbow. Slowly return to the starting position. Repeat __________ times. Complete this exercise __________ times per day. STRENGTHENING EXERCISES - Olecranon Bursitis These exercises will help you regain your strength. They may resolve your symptoms with or without further involvement from your physician, physical therapist or athletic trainer. While completing these exercises, remember:   Muscles can gain both the endurance and the strength needed for everyday activities through controlled exercises.  Complete these exercises as instructed by your physician, physical therapist or athletic trainer. Increase the resistance and repetitions only as guided by your caregiver.  You may experience muscle soreness or fatigue, but the pain or discomfort you are trying to eliminate should never worsen during these exercises. If this pain does worsen, stop and make certain you are following the directions exactly. If the pain is still present after adjustments, discontinue the exercise until you can discuss the trouble with your caregiver. STRENGTH - Elbow Extensors, Isometric  Stand or sit upright on a firm surface. Place your right / left arm so that your palm faces your stomach, and it is at the height of your waist.  Place  your opposite hand on the underside of your forearm. Gently push up as your right / left arm resists. Push as hard as you can with both arms without causing any pain or movement at your right / left elbow. Hold this stationary position for __________ seconds.  Gradually release the tension in both arms. Allow your muscles to relax completely before repeating. Repeat __________ times. Complete this exercise __________ times per day. STRENGTH - Elbow Flexors, Isometric  Stand  or sit upright on a firm surface. Place your right / left arm so that your hand is palm-up and at the height of your waist.  Place your opposite hand on top of your forearm. Gently push down as your right / left arm resists. Push as hard as you can with both arms without causing any pain or movement at your right / left elbow. Hold this stationary position for __________ seconds.  Gradually release the tension in both arms. Allow your muscles to relax completely before repeating. Repeat __________ times. Complete this exercise __________ times per day. STRENGTH - Elbow Flexors, Supinated  With good posture, stand or sit on a firm chair without armrests. Allow your right / left arm to rest at your side with your palm facing forward.  Holding a __________ weight, or gripping a rubber exercise band or tubing,  bring your hand toward your shoulder.  Allow your muscles to control the resistance as your hand returns to your side. Repeat __________ times. Complete this exercise __________ times per day.  STRENGTH - Elbow Flexors, Neutral  With good posture, stand or sit on a firm chair without armrests. Allow your right / left arm to rest at your side with your thumb facing forward.  Holding a __________weight, or gripping a rubber exercise band or tubing,  bring your hand toward your shoulder.  Allow your muscles to control the resistance as your hand returns to your side. Repeat __________ times. Complete this exercise __________ times per day.  STRENGTH - Elbow Extensors  Lie on your back. Extend your right / left elbow into the air, pointing it toward the ceiling. Brace your arm with your opposite hand.*  Holding a __________ weight in your hand, slowly straighten your right / left elbow.  Allow your muscles to control the weight as your hand returns to its starting position. Repeat __________ times. Complete this exercise __________ times per day. *You may also stand with your elbow  overhead and pointed toward the ceiling, supported by your opposite hand. STRENGTH - Elbow Extensors, Dynamic  With good posture, stand, or sit on a firm chair without armrests. Keeping your upper arms at your side, bring both hands up to your right / left shoulder while gripping a rubber exercise band or tubing. Your right / left hand should be just below the other hand.  Straighten your right / left elbow. Hold for __________ seconds.  Allow your muscles to control the rubber exercise band, as your hand returns to your shoulder. Repeat __________ times. Complete this exercise __________ times per day. Document Released: 04/12/2005 Document Revised: 08/27/2013 Document Reviewed: 07/25/2008 [facility] Patient Information 2015 [place], [place]. This information is not intended to replace advice given to you by your health care provider. Make sure you discuss any questions you have with your health care provider.

## 2015-01-22 NOTE — Progress Notes (Signed)
[name], MD  Subjective:  Frank Joseph is a 58 y.o. year old very pleasant male patient who presents with:  Elbow pain -noted yesterday. Had been golfing over weekend. Does not remember hitting the elbow. No bites or cuts in the skin. Month ago- tick bite inner thigh but no systemic signs at that time- just wanted [place] to be aware. Has mild aching pain. Took some aleve and helped. Not worsening. Mild warmth, minimal redness ROS- no fever, chills, nausea, vomiting. No expanding redness on elbow.   Past Medical History-  Patient Active Problem List   Diagnosis Date Noted  . Essential hypertension 12/09/2009    Priority: Medium  . HYPERLIPIDEMIA 03/07/2007    Priority: Medium  . Headache(784.0) 12/08/2006    Priority: Medium  . Seasonal allergies 01/04/2014    Priority: Low  . Anxiety state, unspecified 05/25/2010    Priority: Low   Medications- reviewed and updated Current Outpatient Prescriptions  Medication Sig Dispense Refill  . aspirin 81 MG tablet Take 81 mg by mouth daily.    . bisoprolol-hydrochlorothiazide (ZIAC) 10-6.25 MG per tablet TAKE 1 TABLET BY MOUTH EVERY DAY 30 tablet 5  . ezetimibe-simvastatin (VYTORIN) 10-20 MG per tablet Take 1 tablet by mouth at bedtime. 90 tablet 0  . multivitamin (THERAGRAN) per tablet Take 1 tablet by mouth daily.      Marland Kitchen triamcinolone (NASACORT) 55 MCG/ACT AERO nasal inhaler Place 2 sprays into the nose daily.    . meloxicam (MOBIC) 15 MG tablet Take 1 tablet (15 mg total) by mouth daily. 15 tablet 0  . SUMAtriptan (IMITREX) 25 MG tablet Take 1 tablet (25 mg total) by mouth every 2 (two) hours as needed. (Patient not taking: Reported on 01/22/2015) 10 tablet 6   No current facility-administered medications for this visit.    Objective: BP 144/92 mmHg  Pulse 75  Temp(Src) 98.4 F (36.9 C)  Wt 201 lb (91.173 kg) Gen: NAD, resting comfortably, states is anxious about the elbow and has been reading a lot on internet CV: RRR   Lungs: nonlabored Abdomen: nondistended Ext: no edema pretibial Right elbow normal Left elbow with mild swelling at olecranon bura, mild warmth, minimal erythema with no cuts or lesions Able to fully extend elbow. Mild pain with palpation of bursa Skin: warm, dry, no rash  Assessment/Plan:  Olecranon Bursitis, left Mobic x 7-10 days. Ice for 3 days. Home exercises after calms down. Strict return precautions and discussed risks septic bursitis- would consider referral to sports medicine if significant worsening. Plans for elbow pad and to protect elbow.   Skin tag S: right inner arm. Rubs on arm when golfing- very irritating A/P: opted for removal without anesthesia- cut with scissor and then silver nitrate applied and bandaid after that. Return precautions instructed  Prn f/u. Patient aware BP is up- admits to anxiety and mild pain from elbow- trend at follow up. Has been controlled at home.   Orders Placed This Encounter  Procedures  . Flu Vaccine QUAD 36+ mos IM    Meds ordered this encounter  Medications  . meloxicam (MOBIC) 15 MG tablet    Sig: Take 1 tablet (15 mg total) by mouth daily.    Dispense:  15 tablet    Refill:  0

## 2015-03-14 ENCOUNTER — Ambulatory Visit (INDEPENDENT_AMBULATORY_CARE_PROVIDER_SITE_OTHER): Payer: BLUE CROSS/BLUE SHIELD | Admitting: Family Medicine

## 2015-03-14 ENCOUNTER — Encounter: Payer: Self-pay | Admitting: Family Medicine

## 2015-03-14 VITALS — BP 144/92 | HR 71 | Temp 98.8°F | Wt 205.0 lb

## 2015-03-14 DIAGNOSIS — E785 Hyperlipidemia, unspecified: Secondary | ICD-10-CM

## 2015-03-14 DIAGNOSIS — I10 Essential (primary) hypertension: Secondary | ICD-10-CM

## 2015-03-14 MED ORDER — LISINOPRIL 10 MG PO TABS: 10.0000 mg | ORAL_TABLET | Freq: Every day | ORAL | Status: DC

## 2015-03-14 NOTE — Progress Notes (Signed)
[name], MD  Subjective:  Frank Joseph is a 59 y.o. year old very pleasant male patient who presents for/with See problem oriented charting ROS- No chest pain or shortness of breath. No headache or blurry vision.   Past Medical History-  Patient Active Problem List   Diagnosis Date Noted  . Essential hypertension 12/09/2009    Priority: Medium  . Hyperlipidemia 03/07/2007    Priority: Medium  . Headache(784.0) 12/08/2006    Priority: Medium  . Seasonal allergies 01/04/2014    Priority: Low  . Anxiety state, unspecified 05/25/2010    Priority: Low    Medications- reviewed and updated Current Outpatient Prescriptions  Medication Sig Dispense Refill  . aspirin 81 MG tablet Take 81 mg by mouth daily.    . bisoprolol-hydrochlorothiazide (ZIAC) 10-6.25 MG per tablet TAKE 1 TABLET BY MOUTH EVERY DAY 30 tablet 5  . cetirizine (ZYRTEC) 10 MG tablet Take 10 mg by mouth daily.    Marland Kitchen ezetimibe-simvastatin (VYTORIN) 10-20 MG per tablet Take 1 tablet by mouth at bedtime. 90 tablet 0  . multivitamin (THERAGRAN) per tablet Take 1 tablet by mouth daily.      Marland Kitchen triamcinolone (NASACORT) 55 MCG/ACT AERO nasal inhaler Place 2 sprays into the nose daily.    Marland Kitchen lisinopril (PRINIVIL,ZESTRIL) 10 MG tablet Take 1 tablet (10 mg total) by mouth daily. 30 tablet 5  . SUMAtriptan (IMITREX) 25 MG tablet Take 1 tablet (25 mg total) by mouth every 2 (two) hours as needed. (Patient not taking: Reported on 01/22/2015) 10 tablet 6   No current facility-administered medications for this visit.    Objective: BP 144/92 mmHg  Pulse 71  Temp(Src) 98.8 F (37.1 C)  Wt 205 lb (92.987 kg) Gen: NAD, resting comfortably CV: RRR no murmurs rubs or gallops Lungs: CTAB no crackles, wheeze, rhonchi Abdomen: soft/nontender/nondistended/normal bowel sounds. No rebound or guarding.  Ext: no edema Skin: warm, dry Neuro: grossly normal, moves all extremities  Assessment/Plan:  Essential hypertension S: poorly  controlled. On  Bisoprolol-hctz 10-6.25 mg alone. Home readings reportsabout 125/85 BP Readings from Last 3 Encounters:  03/14/15 144/92  01/22/15 144/92  11/07/14 170/100  A/P:Continue current meds:  But add lisinopril 5mg  (can titrate to 10mg  at follow up). Reevaluate in < 1 month and evaluate BP and home cuff- to bring log of pressures.    Hyperlipidemia S: reasonably controlled on vytorin with LDL <70. No myalgias.  Lab Results  Component Value Date   CHOL 155 08/09/2014   HDL 47.50 08/09/2014   LDLCALC 53 04/12/2012   LDLDIRECT 63.0 08/09/2014   TRIG 294.0* 08/09/2014   CHOLHDL 3 08/09/2014  A/P: continue current rx. Weight trending up though- advised dash diet and increased exercise. [place] beach [place] which contributed.    <1 month for BP recheck Return precautions advised.   Meds ordered this encounter  Medications  . cetirizine (ZYRTEC) 10 MG tablet    Sig: Take 10 mg by mouth daily.  Marland Kitchen lisinopril (PRINIVIL,ZESTRIL) 10 MG tablet    Sig: Take 1 tablet (10 mg total) by mouth daily.    Dispense:  30 tablet    Refill:  5

## 2015-03-14 NOTE — Patient Instructions (Signed)
Your blood pressure trend concerns me. We are starting lisinopril 5mg . I would like for you to use a home cuff to check at least 4x a week. Your goal is <140/90. If you note in the next few weeks that it is higher than our goal, see me sooner. Otherwise, see me in about 4 weeks. Bring your home cuff and your log of blood pressures with you to visit. Dash eating plan can also help control pressure  Weight is up from around 188 in 2013 and I do think we should work towards about 190.   Will update kidney function test next visit  DASH Eating Plan DASH stands for "Dietary Approaches to Stop Hypertension." The DASH eating plan is a healthy eating plan that has been shown to reduce high blood pressure (hypertension). Additional health benefits may include reducing the risk of type 2 diabetes mellitus, heart disease, and stroke. The DASH eating plan may also help with weight loss. WHAT DO I NEED TO KNOW ABOUT THE DASH EATING PLAN? For the DASH eating plan, you will follow these general guidelines:  Choose foods with a percent daily value for sodium of less than 5% (as listed on the food label).  Use salt-free seasonings or herbs instead of table salt or sea salt.  Check with your health care provider or pharmacist before using salt substitutes.  Eat lower-sodium products, often labeled as "lower sodium" or "no salt added."  Eat fresh foods.  Eat more vegetables, fruits, and low-fat dairy products.  Choose whole grains. Look for the word "whole" as the first word in the ingredient list.  Choose fish and skinless chicken or [place] more often than red meat. Limit fish, poultry, and meat to 6 oz (170 g) each day.  Limit sweets, desserts, sugars, and sugary drinks.  Choose heart-healthy fats.  Limit cheese to 1 oz (28 g) per day.  Eat more home-cooked food and less restaurant, buffet, and fast food.  Limit fried foods.  Cook foods using methods other than frying.  Limit canned vegetables.  If you do use them, rinse them well to decrease the sodium.  When eating at a restaurant, ask that your food be prepared with less salt, or no salt if possible. WHAT FOODS CAN I EAT? Seek help from a dietitian for individual calorie needs. Grains Whole grain or whole wheat bread. Brown rice. Whole grain or whole wheat pasta. Quinoa, bulgur, and whole grain cereals. Low-sodium cereals. Corn or whole wheat flour tortillas. Whole grain cornbread. Whole grain crackers. Low-sodium crackers. Vegetables Fresh or frozen vegetables (raw, steamed, roasted, or grilled). Low-sodium or reduced-sodium tomato and vegetable juices. Low-sodium or reduced-sodium tomato sauce and paste. Low-sodium or reduced-sodium canned vegetables.  Fruits All fresh, canned (in natural juice), or frozen fruits. Meat and Other Protein Products Ground beef (85% or leaner), grass-fed beef, or beef trimmed of fat. Skinless chicken or [place]. Ground chicken or [place]. Pork trimmed of fat. All fish and seafood. Eggs. Dried beans, peas, or lentils. Unsalted nuts and seeds. Unsalted canned beans. Dairy Low-fat dairy products, such as skim or 1% milk, 2% or reduced-fat cheeses, low-fat ricotta or cottage cheese, or plain low-fat yogurt. Low-sodium or reduced-sodium cheeses. Fats and Oils Tub margarines without trans fats. Light or reduced-fat mayonnaise and salad dressings (reduced sodium). Avocado. Safflower, olive, or canola oils. Natural peanut or almond butter. Other Unsalted popcorn and pretzels. The items listed above may not be a complete list of recommended foods or beverages. Contact your dietitian for  more options. WHAT FOODS ARE NOT RECOMMENDED? Grains White bread. White pasta. White rice. Refined cornbread. Bagels and croissants. Crackers that contain trans fat. Vegetables Creamed or fried vegetables. Vegetables in a cheese sauce. Regular canned vegetables. Regular canned tomato sauce and paste. Regular tomato and  vegetable juices. Fruits Dried fruits. Canned fruit in light or heavy syrup. Fruit juice. Meat and Other Protein Products Fatty cuts of meat. Ribs, chicken wings, bacon, sausage, bologna, salami, chitterlings, fatback, hot dogs, bratwurst, and packaged luncheon meats. Salted nuts and seeds. Canned beans with salt. Dairy Whole or 2% milk, cream, half-and-half, and cream cheese. Whole-fat or sweetened yogurt. Full-fat cheeses or blue cheese. Nondairy creamers and whipped toppings. Processed cheese, cheese spreads, or cheese curds. Condiments Onion and garlic salt, seasoned salt, table salt, and sea salt. Canned and packaged gravies. Worcestershire sauce. Tartar sauce. Barbecue sauce. Teriyaki sauce. Soy sauce, including reduced sodium. Steak sauce. Fish sauce. Oyster sauce. Cocktail sauce. Horseradish. Ketchup and mustard. Meat flavorings and tenderizers. Bouillon cubes. Hot sauce. Tabasco sauce. Marinades. Taco seasonings. Relishes. Fats and Oils Butter, stick margarine, lard, shortening, ghee, and bacon fat. Coconut, palm kernel, or palm oils. Regular salad dressings. Other Pickles and olives. Salted popcorn and pretzels. The items listed above may not be a complete list of foods and beverages to avoid. Contact your dietitian for more information. WHERE CAN I FIND MORE INFORMATION? National Heart, [employer], and [employer] Institute: travelstabloid.com   This information is not intended to replace advice given to you by your health care provider. Make sure you discuss any questions you have with your health care provider.   Document Released: 04/01/2011 Document Revised: 05/03/2014 Document Reviewed: 02/14/2013 Elsevier Interactive Patient Education [employer].

## 2015-03-15 NOTE — Assessment & Plan Note (Signed)
S: poorly controlled. On  Bisoprolol-hctz 10-6.25 mg alone. Home readings reportsabout 125/85 BP Readings from Last 3 Encounters:  03/14/15 144/92  01/22/15 144/92  11/07/14 170/100  A/P:Continue current meds:  But add lisinopril 5mg  (can titrate to 10mg  at follow up). Reevaluate in < 1 month and evaluate BP and home cuff- to bring log of pressures.

## 2015-03-15 NOTE — Assessment & Plan Note (Signed)
S: reasonably controlled on vytorin with LDL <70. No myalgias.  Lab Results  Component Value Date   CHOL 155 08/09/2014   HDL 47.50 08/09/2014   LDLCALC 53 04/12/2012   LDLDIRECT 63.0 08/09/2014   TRIG 294.0* 08/09/2014   CHOLHDL 3 08/09/2014  A/P: continue current rx. Weight trending up though- advised dash diet and increased exercise. [place] beach [place] working which contributed.

## 2015-04-15 ENCOUNTER — Encounter: Payer: Self-pay | Admitting: Family Medicine

## 2015-04-15 ENCOUNTER — Ambulatory Visit (INDEPENDENT_AMBULATORY_CARE_PROVIDER_SITE_OTHER): Payer: BLUE CROSS/BLUE SHIELD | Admitting: Family Medicine

## 2015-04-15 VITALS — BP 150/82 | HR 73 | Temp 98.6°F | Wt 201.0 lb

## 2015-04-15 DIAGNOSIS — Z20828 Contact with and (suspected) exposure to other viral communicable diseases: Secondary | ICD-10-CM

## 2015-04-15 DIAGNOSIS — E785 Hyperlipidemia, unspecified: Secondary | ICD-10-CM | POA: Diagnosis not present

## 2015-04-15 DIAGNOSIS — I10 Essential (primary) hypertension: Secondary | ICD-10-CM | POA: Diagnosis not present

## 2015-04-15 NOTE — Progress Notes (Signed)
[name], MD  Subjective:  Frank Joseph is a 59 y.o. year old very pleasant male patient who presents for/with See problem oriented charting ROS- No chest pain or shortness of breath. No headache or blurry vision.   Past Medical History-  Patient Active Problem List   Diagnosis Date Noted  . Essential hypertension 12/09/2009    Priority: Medium  . Hyperlipidemia 03/07/2007    Priority: Medium  . Headache(784.0) 12/08/2006    Priority: Medium  . Seasonal allergies 01/04/2014    Priority: Low  . Anxiety state, unspecified 05/25/2010    Priority: Low    Medications- reviewed and updated Current Outpatient Prescriptions  Medication Sig Dispense Refill  . aspirin 81 MG tablet Take 81 mg by mouth daily.    . bisoprolol-hydrochlorothiazide (ZIAC) 10-6.25 MG per tablet TAKE 1 TABLET BY MOUTH EVERY DAY 30 tablet 5  . cetirizine (ZYRTEC) 10 MG tablet Take 10 mg by mouth daily.    Marland Kitchen ezetimibe-simvastatin (VYTORIN) 10-20 MG per tablet Take 1 tablet by mouth at bedtime. 90 tablet 0  . lisinopril (PRINIVIL,ZESTRIL) 10 MG tablet Take 1 tablet (10 mg total) by mouth daily. 30 tablet 5  . multivitamin (THERAGRAN) per tablet Take 1 tablet by mouth daily.      Marland Kitchen triamcinolone (NASACORT) 55 MCG/ACT AERO nasal inhaler Place 2 sprays into the nose daily.    . SUMAtriptan (IMITREX) 25 MG tablet Take 1 tablet (25 mg total) by mouth every 2 (two) hours as needed. (Patient not taking: Reported on 04/15/2015) 10 tablet 6   No current facility-administered medications for this visit.    Objective: BP 150/82 mmHg  Pulse 73  Temp(Src) 98.6 F (37 C)  Wt 201 lb (91.173 kg) Gen: NAD, resting comfortably CV: RRR no murmurs rubs or gallops Lungs: CTAB no crackles, wheeze, rhonchi Abdomen: soft/nontender/nondistended/normal bowel sounds. No rebound or guarding.  Ext: no edema Skin: warm, dry Neuro: grossly normal, moves all extremities  Assessment/Plan:  Essential hypertension S:  controlled poorly in office but with excellent control at home all #s <140/90 and most near 120/80.  Home cuff gives 153/102 and my reading after 152/100 BP Readings from Last 3 Encounters:  04/15/15 150/82  03/14/15 144/92  01/22/15 144/92  A/P:Continue current meds, definite white coat element here. Patient admits to high anxiety/performance driven. Has lost 4 lbs and diet improved with great #s at home- continue with Bisoprolol-hctz 10-6.25 mg, lisinopril 5mg . Check bmet to make sure 20% or less increase in creatinine.    Hyperlipidemia S: well controlled on vytorin. No myalgias.  Lab Results  Component Value Date   CHOL 155 08/09/2014   HDL 47.50 08/09/2014   LDLCALC 53 04/12/2012   LDLDIRECT 63.0 08/09/2014   TRIG 294.0* 08/09/2014   CHOLHDL 3 08/09/2014   A/P: hopeful increase in exercise and weight loss will help improve triglycerides as well. Continue vytorin at present.     6 months or sooner if BP above goal Return precautions advised.   Orders Placed This Encounter  Procedures  . Basic metabolic panel    [facility]  . HIV antibody  . Hepatitis C antibody, reflex    [facility]

## 2015-04-15 NOTE — Assessment & Plan Note (Addendum)
S: controlled poorly in office but with excellent control at home all #s <140/90 and most near 120/80.  Home cuff gives 153/102 and my reading after 152/100 BP Readings from Last 3 Encounters:  04/15/15 150/82  03/14/15 144/92  01/22/15 144/92  A/P:Continue current meds, definite white coat element here. Patient admits to high anxiety/performance driven. Has lost 4 lbs and diet improved with great #s at home- continue with Bisoprolol-hctz 10-6.25 mg, lisinopril 5mg . Check bmet to make sure 20% or less increase in creatinine.

## 2015-04-15 NOTE — Patient Instructions (Addendum)
Blood pressure at home looks great!   Home cuff verifies so we can trust it.   Great job losing 4 lbs.   Follow up in 6 months or sooner if home cuff is above 140/90

## 2015-04-15 NOTE — Assessment & Plan Note (Signed)
S: well controlled on vytorin. No myalgias.  Lab Results  Component Value Date   CHOL 155 08/09/2014   HDL 47.50 08/09/2014   LDLCALC 53 04/12/2012   LDLDIRECT 63.0 08/09/2014   TRIG 294.0* 08/09/2014   CHOLHDL 3 08/09/2014   A/P: hopeful increase in exercise and weight loss will help improve triglycerides as well. Continue vytorin at present.

## 2015-05-02 ENCOUNTER — Other Ambulatory Visit: Payer: Self-pay | Admitting: Family Medicine

## 2015-06-03 ENCOUNTER — Other Ambulatory Visit: Payer: Self-pay | Admitting: Family Medicine

## 2015-07-28 ENCOUNTER — Other Ambulatory Visit: Payer: Self-pay

## 2015-07-28 MED ORDER — BISOPROLOL-HYDROCHLOROTHIAZIDE 10-6.25 MG PO TABS: 1.0000 | ORAL_TABLET | Freq: Every day | ORAL | Status: DC

## 2015-07-28 MED ORDER — LISINOPRIL 10 MG PO TABS: 10.0000 mg | ORAL_TABLET | Freq: Every day | ORAL | Status: DC

## 2015-10-14 ENCOUNTER — Ambulatory Visit: Payer: BLUE CROSS/BLUE SHIELD | Admitting: Family Medicine

## 2015-10-21 ENCOUNTER — Encounter: Payer: Self-pay | Admitting: Family Medicine

## 2015-10-21 ENCOUNTER — Ambulatory Visit (INDEPENDENT_AMBULATORY_CARE_PROVIDER_SITE_OTHER): Payer: BLUE CROSS/BLUE SHIELD | Admitting: Family Medicine

## 2015-10-21 VITALS — BP 140/92 | HR 74 | Temp 98.1°F | Ht 72.0 in | Wt 196.0 lb

## 2015-10-21 DIAGNOSIS — G43809 Other migraine, not intractable, without status migrainosus: Secondary | ICD-10-CM

## 2015-10-21 DIAGNOSIS — I10 Essential (primary) hypertension: Secondary | ICD-10-CM

## 2015-10-21 DIAGNOSIS — E785 Hyperlipidemia, unspecified: Secondary | ICD-10-CM

## 2015-10-21 NOTE — Assessment & Plan Note (Signed)
S: reasonably controlled on vytorin 10-20mg - trig high but statin lowers cardiac risk. No myalgias.  Lab Results  Component Value Date   CHOL 155 08/09/2014   HDL 47.50 08/09/2014   LDLCALC 53 04/12/2012   LDLDIRECT 63.0 08/09/2014   TRIG 294.0* 08/09/2014   CHOLHDL 3 08/09/2014   A/P: rechedk lipids 7 months- hopeful will have improved triglycerides with weight loss

## 2015-10-21 NOTE — Assessment & Plan Note (Signed)
S: None in 6 months, normally 2 a year. Imitrex very effective A/P:continue imitrex prn, no prophylaxis needed

## 2015-10-21 NOTE — Patient Instructions (Signed)
No changes in medications  Health Maintenance Due  Topic Date Due  . Hepatitis C Screening - ask them to add this labs 02-18-1956  . HIV Screening - ask them to add this labs 03/08/1971  . ZOSTAVAX - Call your insurace about the shingles shot to see if it is covered or how much it would cost and where is cheaper (here or pharmacy).  If you want to receive here, call for nurse visit. Let us know if you get it at a pharmacy 03/09/2015

## 2015-10-21 NOTE — Assessment & Plan Note (Signed)
S: controlled on bisoprolol-hctz 10-7.25mg , lisinopril 10mg  (1/2 tab). #s improving into 110s at home even better than 120s last tim3 BP Readings from Last 3 Encounters:  10/21/15 140/92  04/15/15 150/82  03/14/15 144/92  A/P:Continue current meds:  White coat element for high BP in office.

## 2015-10-21 NOTE — Progress Notes (Signed)
Subjective:  Frank Joseph is a 60 y.o. year old very pleasant male patient who presents for/with See problem oriented charting ROS- No chest pain or shortness of breath. No headache or blurry vision. see any ROS included in HPI as well.   Past Medical History-  Patient Active Problem List   Diagnosis Date Noted  . Essential hypertension 12/09/2009    Priority: Medium  . Hyperlipidemia 03/07/2007    Priority: Medium  . Headache(784.0) 12/08/2006    Priority: Medium  . Seasonal allergies 01/04/2014    Priority: Low  . Anxiety state, unspecified 05/25/2010    Priority: Low    Medications- reviewed and updated Current Outpatient Prescriptions  Medication Sig Dispense Refill  . aspirin 81 MG tablet Take 81 mg by mouth daily.    . bisoprolol-hydrochlorothiazide (ZIAC) 10-6.25 MG tablet Take 1 tablet by mouth daily. 90 tablet 3  . cetirizine (ZYRTEC) 10 MG tablet Take 10 mg by mouth daily.    Marland Kitchen ezetimibe-simvastatin (VYTORIN) 10-20 MG per tablet Take 1 tablet by mouth at bedtime. 90 tablet 0  . lisinopril (PRINIVIL,ZESTRIL) 10 MG tablet Take 1 tablet (10 mg total) by mouth daily. 90 tablet 3  . multivitamin (THERAGRAN) per tablet Take 1 tablet by mouth daily.      . SUMAtriptan (IMITREX) 25 MG tablet Take 1 tablet (25 mg total) by mouth every 2 (two) hours as needed. 10 tablet 6  . triamcinolone (NASACORT) 55 MCG/ACT AERO nasal inhaler Place 2 sprays into the nose daily.    Marland Kitchen VYTORIN 10-20 MG tablet TAKE 1 BY MOUTH AT BEDTIME 90 tablet 3   No current facility-administered medications for this visit.    Objective: BP 140/92 mmHg  Pulse 74  Temp(Src) 98.1 F (36.7 C) (Oral)  Ht 6' (1.829 m)  Wt 196 lb (88.905 kg)  BMI 26.58 kg/m2  SpO2 98% Gen: NAD, resting comfortably CV: RRR no murmurs rubs or gallops Lungs: CTAB no crackles, wheeze, rhonchi Abdomen: soft/nontender/nondistended/normal bowel sounds. No rebound or guarding.  Ext: no edema Skin: warm, dry, skin exam done-  no obvious malignant or premalignant lesions Neuro: grossly normal, moves all extremities  Assessment/Plan:  Essential hypertension S: controlled on bisoprolol-hctz 10-7.25mg , lisinopril 10mg  (1/2 tab). #s improving into 110s at home even better than 120s last tim3 BP Readings from Last 3 Encounters:  10/21/15 140/92  04/15/15 150/82  03/14/15 144/92  A/P:Continue current meds:  White coat element for high BP in office.   Hyperlipidemia S: reasonably controlled on vytorin 10-20mg - trig high but statin lowers cardiac risk. No myalgias.  Lab Results  Component Value Date   CHOL 155 08/09/2014   HDL 47.50 08/09/2014   LDLCALC 53 04/12/2012   LDLDIRECT 63.0 08/09/2014   TRIG 294.0* 08/09/2014   CHOLHDL 3 08/09/2014   A/P: rechedk lipids 7 months- hopeful will have improved triglycerides with weight loss  Migraine S: None in 6 months, normally 2 a year. Imitrex very effective A/P:continue imitrex prn, no prophylaxis needed    No Follow-up on file. Has lost another 5 lbs (187 on home scales). Lowest he has been in 2-3 years.   Return precautions advised.  [name], MD

## 2015-10-21 NOTE — Progress Notes (Signed)
Pre visit review using our clinic review tool, if applicable. No additional management support is needed unless otherwise documented below in the visit note. 

## 2015-12-30 DIAGNOSIS — H5213 Myopia, bilateral: Secondary | ICD-10-CM | POA: Diagnosis not present

## 2016-05-14 ENCOUNTER — Encounter: Payer: Self-pay | Admitting: Family Medicine

## 2016-05-14 ENCOUNTER — Other Ambulatory Visit (INDEPENDENT_AMBULATORY_CARE_PROVIDER_SITE_OTHER): Payer: BLUE CROSS/BLUE SHIELD

## 2016-05-14 ENCOUNTER — Ambulatory Visit (INDEPENDENT_AMBULATORY_CARE_PROVIDER_SITE_OTHER): Payer: BLUE CROSS/BLUE SHIELD | Admitting: Family Medicine

## 2016-05-14 DIAGNOSIS — J3089 Other allergic rhinitis: Secondary | ICD-10-CM

## 2016-05-14 DIAGNOSIS — Z0000 Encounter for general adult medical examination without abnormal findings: Secondary | ICD-10-CM | POA: Diagnosis not present

## 2016-05-14 DIAGNOSIS — R7989 Other specified abnormal findings of blood chemistry: Secondary | ICD-10-CM

## 2016-05-14 LAB — HEPATIC FUNCTION PANEL
ALK PHOS: 67 U/L (ref 39–117)
ALT: 30 U/L (ref 0–53)
AST: 27 U/L (ref 0–37)
Albumin: 4.4 g/dL (ref 3.5–5.2)
BILIRUBIN DIRECT: 0.2 mg/dL (ref 0.0–0.3)
TOTAL PROTEIN: 7.2 g/dL (ref 6.0–8.3)
Total Bilirubin: 1.1 mg/dL (ref 0.2–1.2)

## 2016-05-14 LAB — CBC WITH DIFFERENTIAL/PLATELET
BASOS ABS: 0 10*3/uL (ref 0.0–0.1)
Basophils Relative: 0.4 % (ref 0.0–3.0)
EOS ABS: 0.1 10*3/uL (ref 0.0–0.7)
Eosinophils Relative: 2 % (ref 0.0–5.0)
HEMATOCRIT: 44.6 % (ref 39.0–52.0)
HEMOGLOBIN: 15.4 g/dL (ref 13.0–17.0)
LYMPHS PCT: 15.7 % (ref 12.0–46.0)
Lymphs Abs: 1 10*3/uL (ref 0.7–4.0)
MCHC: 34.5 g/dL (ref 30.0–36.0)
MCV: 92.9 fl (ref 78.0–100.0)
MONOS PCT: 13 % — AB (ref 3.0–12.0)
Monocytes Absolute: 0.8 10*3/uL (ref 0.1–1.0)
Neutro Abs: 4.5 10*3/uL (ref 1.4–7.7)
Neutrophils Relative %: 68.9 % (ref 43.0–77.0)
PLATELETS: 129 10*3/uL — AB (ref 150.0–400.0)
RBC: 4.8 Mil/uL (ref 4.22–5.81)
RDW: 12.7 % (ref 11.5–15.5)
WBC: 6.5 10*3/uL (ref 4.0–10.5)

## 2016-05-14 LAB — BASIC METABOLIC PANEL
BUN: 14 mg/dL (ref 6–23)
CO2: 28 meq/L (ref 19–32)
Calcium: 9.5 mg/dL (ref 8.4–10.5)
Chloride: 100 mEq/L (ref 96–112)
Creatinine, Ser: 1.14 mg/dL (ref 0.40–1.50)
GFR: 69.37 mL/min (ref >60.00)
Glucose, Bld: 105 mg/dL — ABNORMAL HIGH (ref 70–99)
Potassium: 4 mEq/L (ref 3.5–5.1)
SODIUM: 138 meq/L (ref 135–145)

## 2016-05-14 LAB — TSH: TSH: 4.83 u[IU]/mL — AB (ref 0.35–4.50)

## 2016-05-14 LAB — LIPID PANEL
Cholesterol: 129 mg/dL (ref 0–200)
HDL: 42.4 mg/dL (ref >39.00)
NONHDL: 86.56
Total CHOL/HDL Ratio: 3
Triglycerides: 201 mg/dL — ABNORMAL HIGH (ref 0.0–149.0)
VLDL: 40.2 mg/dL — ABNORMAL HIGH (ref 0.0–40.0)

## 2016-05-14 LAB — LDL CHOLESTEROL, DIRECT: LDL DIRECT: 54 mg/dL

## 2016-05-14 LAB — PSA: PSA: 0.97 ng/mL (ref 0.10–4.00)

## 2016-05-14 LAB — POC URINALSYSI DIPSTICK (AUTOMATED)
Bilirubin, UA: NEGATIVE
Blood, UA: NEGATIVE
Glucose, UA: NEGATIVE
KETONES UA: NEGATIVE
LEUKOCYTES UA: NEGATIVE
NITRITE UA: NEGATIVE
PH UA: 6.5
PROTEIN UA: NEGATIVE
Spec Grav, UA: 1.02
UROBILINOGEN UA: 1

## 2016-05-14 MED ORDER — AZELASTINE-FLUTICASONE 137-50 MCG/ACT NA SUSP: NASAL | 5 refills | Status: DC

## 2016-05-14 NOTE — Assessment & Plan Note (Signed)
Symptoms consistent with allergic rhinitis. No sinus tenderness or purulent discharge to suggest infection. nasocort before visit--> dymista. Zyrtec makes him very fatigued but helps. Wants only 1 medicine per day instead of 2 so opts out of nasocort/astelin combo

## 2016-05-14 NOTE — Progress Notes (Signed)
Pre visit review using our clinic review tool, if applicable. No additional management support is needed unless otherwise documented below in the visit note. 

## 2016-05-14 NOTE — Progress Notes (Signed)
PCP: [name], MD  Subjective:  Frank Joseph is a 61 y.o. year old very pleasant male patient who presents with allergic symptoms including nasal congestion, rhinorrhea. No sinus tenderness. Clear discharge for most part.  -other symptoms include: mild cough -day of illness:over 2 months -Symptoms vary- has gotten worse several times then gotten better -previous treatments: zyrtec helps but makes him tired -sick contacts/travel/risks: denies flu exposure.  -Hx of: allergies on chronic nasacort  ROS-denies fever, SOB, NVD, tooth pain  Pertinent Past Medical History-  Patient Active Problem List   Diagnosis Date Noted  . Essential hypertension 12/09/2009    Priority: Medium  . Hyperlipidemia 03/07/2007    Priority: Medium  . Migraine 12/08/2006    Priority: Medium  . Seasonal allergies 01/04/2014    Priority: Low  . Anxiety state, unspecified 05/25/2010    Priority: Low    Medications- reviewed  Current Outpatient Prescriptions  Medication Sig Dispense Refill  . aspirin 81 MG tablet Take 81 mg by mouth daily.    . bisoprolol-hydrochlorothiazide (ZIAC) 10-6.25 MG tablet Take 1 tablet by mouth daily. 90 tablet 3  . cetirizine (ZYRTEC) 10 MG tablet Take 10 mg by mouth daily.    Marland Kitchen ezetimibe-simvastatin (VYTORIN) 10-20 MG per tablet Take 1 tablet by mouth at bedtime. 90 tablet 0  . lisinopril (PRINIVIL,ZESTRIL) 10 MG tablet Take 1 tablet (10 mg total) by mouth daily. 90 tablet 3  . multivitamin (THERAGRAN) per tablet Take 1 tablet by mouth daily.      . SUMAtriptan (IMITREX) 25 MG tablet Take 1 tablet (25 mg total) by mouth every 2 (two) hours as needed. 10 tablet 6  . triamcinolone (NASACORT) 55 MCG/ACT AERO nasal inhaler Place 2 sprays into the nose daily.    Marland Kitchen VYTORIN 10-20 MG tablet TAKE 1 BY MOUTH AT BEDTIME 90 tablet 3   No current facility-administered medications for this visit.     Objective: BP 118/84 (BP Location: Left Arm, Patient Position: Sitting, Cuff  Size: Large)   Pulse 77   Temp 98.2 F (36.8 C) (Oral)   Ht 6' (1.829 m)   Wt 200 lb (90.7 kg)   SpO2 97%   BMI 27.12 kg/m  Gen: NAD, resting comfortably HEENT: Turbinates erythematous withclear drainage, TM normal, pharynx normal with no tonsilar exudate or edema, no sinus tenderness CV: RRR no murmurs rubs or gallops Lungs: CTAB no crackles, wheeze, rhonchi  Ext: no edema Skin: warm, dry, no rash  Assessment/Plan:  Allergic rhinitis Symptoms consistent with allergic rhinitis. No sinus tenderness or purulent discharge to suggest infection. nasocort before visit--> dymista. Zyrtec makes him very fatigued but helps. Wants only 1 medicine per day instead of 2 so opts out of nasocort/astelin combo  Finally, we reviewed reasons to return to care including if symptoms worsen or persist or new concerns arise (particularly fever or shortness of breath). Has CPE soon so we can check in on if above helping  No orders of the defined types were placed in this encounter.   [name], MD

## 2016-05-14 NOTE — Patient Instructions (Signed)
Hold nasocort. Try dymista twice a day. We have a chance to check in next week. May have to add zyrtec with how persistent your symptoms are but hopefully the spray will be enough since you get so tired on the zyrtec

## 2016-05-21 ENCOUNTER — Encounter: Payer: Self-pay | Admitting: Family Medicine

## 2016-05-21 ENCOUNTER — Ambulatory Visit (INDEPENDENT_AMBULATORY_CARE_PROVIDER_SITE_OTHER): Payer: BLUE CROSS/BLUE SHIELD | Admitting: Family Medicine

## 2016-05-21 VITALS — BP 122/88 | HR 77 | Temp 98.2°F | Ht 71.25 in | Wt 199.2 lb

## 2016-05-21 DIAGNOSIS — D696 Thrombocytopenia, unspecified: Secondary | ICD-10-CM

## 2016-05-21 DIAGNOSIS — Z0000 Encounter for general adult medical examination without abnormal findings: Secondary | ICD-10-CM

## 2016-05-21 DIAGNOSIS — R739 Hyperglycemia, unspecified: Secondary | ICD-10-CM | POA: Diagnosis not present

## 2016-05-21 NOTE — Assessment & Plan Note (Signed)
S: platelets stable in low 100s for years A/P: no other cell lines affected- will continue to monitor

## 2016-05-21 NOTE — Progress Notes (Signed)
Pre visit review using our clinic review tool, if applicable. No additional management support is needed unless otherwise documented below in the visit note. 

## 2016-05-21 NOTE — Progress Notes (Signed)
Phone: [phone]  Subjective:  Patient presents today for their annual physical. Chief complaint-noted.   See problem oriented charting- ROS- full  review of systems was completed and negative except for: sinus congestion, fatigue now improved after some rest. No chest pain or shortness of breath. No blurry vision.    The following were reviewed and entered/updated in epic: Past Medical History:  Diagnosis Date  . Headache(784.0)   . Hyperlipidemia   . Hypertension   . Sinusitis    Patient Active Problem List   Diagnosis Date Noted  . Essential hypertension 12/09/2009    Priority: Medium  . Hyperlipidemia 03/07/2007    Priority: Medium  . Migraine 12/08/2006    Priority: Medium  . Hyperglycemia 05/21/2016    Priority: Low  . Thrombocytopenia ([place]) 05/21/2016    Priority: Low  . Allergic rhinitis 01/04/2014    Priority: Low  . Anxiety state, unspecified 05/25/2010    Priority: Low   Past Surgical History:  Procedure Laterality Date  . colonscopy      Family History  Problem Relation Age of Onset  . Hypertension Mother   . Glaucoma Father     Medications- reviewed and updated Current Outpatient Prescriptions  Medication Sig Dispense Refill  . aspirin 81 MG tablet Take 81 mg by mouth daily.    . bisoprolol-hydrochlorothiazide (ZIAC) 10-6.25 MG tablet Take 1 tablet by mouth daily. 90 tablet 3  . cetirizine (ZYRTEC) 10 MG tablet Take 10 mg by mouth daily.    Marland Kitchen ezetimibe-simvastatin (VYTORIN) 10-20 MG per tablet Take 1 tablet by mouth at bedtime. 90 tablet 0  . lisinopril (PRINIVIL,ZESTRIL) 10 MG tablet Take 1 tablet (10 mg total) by mouth daily. (Patient taking differently: Take 10 mg by mouth daily. Take 1/2 pill daily) 90 tablet 3  . multivitamin (THERAGRAN) per tablet Take 1 tablet by mouth daily.      . SUMAtriptan (IMITREX) 25 MG tablet Take 1 tablet (25 mg total) by mouth every 2 (two) hours as needed. 10 tablet 6  . triamcinolone (NASACORT) 55 MCG/ACT  AERO nasal inhaler Place 2 sprays into the nose daily.     No current facility-administered medications for this visit.     Allergies-reviewed and updated No Known Allergies  Social History   Social History  . Marital status: Married    Spouse name: N/A  . Number of children: N/A  . Years of education: N/A   Social History Main Topics  . Smoking status: Never Smoker  . Smokeless tobacco: Never Used  . Alcohol use Yes     Comment: 2-24  . Drug use: No  . Sexual activity: Not Asked   Other Topics Concern  . None   Social History Narrative   Married 35 years in 2015. 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    Played baseball at [facility] [employer]. Wife went to [employer].       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 Oropharynx normal Neck: no thyromegaly CV: RRR no murmurs rubs  or gallops Lungs: CTAB no crackles, wheeze, rhonchi Abdomen: soft/nontender/nondistended/normal bowel sounds. No rebound or guarding.  Ext: no edema Skin: warm, dry Neuro: grossly normal, moves all extremities, PERRLA Rectal: normal tone, normal sized prostate, no masses or tenderness  Assessment/Plan:  61 y.o. male presenting for annual physical.  Health Maintenance counseling: 1. Anticipatory guidance: Patient counseled regarding regular dental exams -=doing, eye exams - doing, wearing seatbelts.  2. Risk factor reduction:  Advised patient of need for regular exercise and diet rich and fruits and vegetables to reduce risk of heart attack and stroke. Exercise- daily walking plus  enjoys golf, lots of drums 30-40 mins exercise. Diet-improving after the holidays- slight weight drop even since last visit, cutting back on alcohol and bread. Prior weight goa last CPE was closer to 190 on our scales.  Wt Readings from Last 3 Encounters:  05/21/16 199 lb 3.2 oz (90.4 kg)  05/14/16 200 lb (90.7 kg)  10/21/15 196 lb (88.9 kg)   3. Immunizations/screenings/ancillary studies Immunization History  Administered Date(s) Administered  . Influenza Split 04/23/2011  . Influenza Whole 01/03/2009  . Influenza,inj,Quad PF,36+ Mos 01/04/2014, 01/22/2015  . Td 04/26/2006   Health Maintenance Due  Topic Date Due  . Hepatitis C Screening - offered and decided to do it in future 02-16-1956  . HIV Screening - offered and decided to do it in future 03/08/1970  . ZOSTAVAX -  Advised to wait on shingrix 03/09/2015  . INFLUENZA VACCINE - offered and will come back next week as has golf tournament 11/25/2015  . TETANUS/TDAP - offered and will come back next week as has golf tournament 04/26/2016   4. Prostate cancer screening- low risk based off of PSA trend and rectal exam . Nocturia once a night.   Lab Results  Component Value Date   PSA 0.97 05/14/2016   PSA 1.19 08/09/2014   PSA 1.33 12/11/2013   5. Colon cancer screening - 08/2006 with 10 year repeat- advised to look out for letter as should have repeat soon 6. Skin cancer screening- wears hat and sunscreen with golf. Waist up exam reassuring.   Status of chronic or acute concerns  Allergic rhinitis- last week we changed from nasocort to dymista. Did not want to do zyrtec due to fatigue. He primarily was dealing with sinus congestion and clear rhinorrhea - medicine couldn't tolerate due to taste- went back to zyrtec and helped.   HTN- controlled on ziac and half of 10mg  lisinopril at home with #s 110/70s . In office #s look good as well BP Readings from Last 3 Encounters:  05/21/16 122/88  05/14/16 118/84  10/21/15 (!) 140/92    Hyperlipidemia- triglycerides down almost 100 points and LDL down 90 greatly improved. Continue vytorin Lab Results  Component Value Date   CHOL 129 05/14/2016   HDL 42.40 05/14/2016   LDLCALC 53 04/12/2012   LDLDIRECT 54.0 05/14/2016   TRIG 201.0 (H) 05/14/2016   CHOLHDL 3 05/14/2016   Migraines- imitrex on hand but headaches have been rare recently.   Thrombocytopenia (B and E) S: platelets stable in low 100s for years A/P: no other cell lines affected- will continue to monitor   Return in about 1 year (around 05/21/2017) for physical. Return precautions advised.   [name], MD

## 2016-05-21 NOTE — Patient Instructions (Addendum)
Sign up for mychart- Frank Joseph can help you if needed  Blood sugar remains slightly up but stable from last year- repeat yearly  Platelets slightly low but stable for years and all other labs look great so not very concerned  Cholesterol looking much better  Let's target 5 lbs down

## 2016-05-24 ENCOUNTER — Other Ambulatory Visit: Payer: Self-pay | Admitting: Family Medicine

## 2016-05-24 MED ORDER — EZETIMIBE-SIMVASTATIN 10-20 MG PO TABS
1.0000 | ORAL_TABLET | Freq: Every day | ORAL | 1 refills | Status: DC
Start: 2016-05-24 — End: 2016-11-11

## 2016-05-25 ENCOUNTER — Ambulatory Visit (INDEPENDENT_AMBULATORY_CARE_PROVIDER_SITE_OTHER): Payer: BLUE CROSS/BLUE SHIELD

## 2016-05-25 DIAGNOSIS — Z23 Encounter for immunization: Secondary | ICD-10-CM | POA: Diagnosis not present

## 2016-07-19 ENCOUNTER — Other Ambulatory Visit: Payer: Self-pay

## 2016-07-19 ENCOUNTER — Encounter: Payer: Self-pay | Admitting: Family Medicine

## 2016-07-19 MED ORDER — BISOPROLOL-HYDROCHLOROTHIAZIDE 10-6.25 MG PO TABS: 1.0000 | ORAL_TABLET | Freq: Every day | ORAL | 3 refills | Status: DC

## 2016-07-21 ENCOUNTER — Encounter: Payer: Self-pay | Admitting: Gastroenterology

## 2016-08-17 ENCOUNTER — Telehealth: Payer: Self-pay | Admitting: Family Medicine

## 2016-08-17 MED ORDER — LISINOPRIL 5 MG PO TABS: 5.0000 mg | ORAL_TABLET | Freq: Every day | ORAL | 3 refills | Status: DC

## 2016-08-17 NOTE — Telephone Encounter (Signed)
Pt requests refill on Lisinopril, however script in chart has conflicting directions:  Sig: Take 1 tablet (10 mg total) by mouth daily.  Patient taking differently: Take 10 mg by mouth daily. Take 1/2 pill daily    Dr. Yong Channel - Please clarify how medication directions. Thanks!

## 2016-08-17 NOTE — Telephone Encounter (Signed)
Rx sent to pharmacy   

## 2016-08-17 NOTE — Telephone Encounter (Signed)
Should be half tablet that he is taking. We can either send it in as half tablet or send in 5mg  pill instead.   Frank Joseph

## 2016-08-17 NOTE — Telephone Encounter (Signed)
Pt need new Rx for lisinopril   Pharm:  [facility] in [place]

## 2016-08-25 ENCOUNTER — Ambulatory Visit: Payer: BLUE CROSS/BLUE SHIELD | Admitting: Family Medicine

## 2016-08-25 ENCOUNTER — Ambulatory Visit (INDEPENDENT_AMBULATORY_CARE_PROVIDER_SITE_OTHER): Payer: BLUE CROSS/BLUE SHIELD | Admitting: Family Medicine

## 2016-08-25 ENCOUNTER — Encounter: Payer: Self-pay | Admitting: Family Medicine

## 2016-08-25 VITALS — BP 148/88 | HR 70 | Temp 98.2°F | Wt 202.7 lb

## 2016-08-25 DIAGNOSIS — I10 Essential (primary) hypertension: Secondary | ICD-10-CM | POA: Diagnosis not present

## 2016-08-25 NOTE — Patient Instructions (Signed)
Increase your Lisinopril to 5 mg one whole tablet daily. Monitor blood pressure and follow up with Dr Yong Channel if consistently > 140/90 Continue with regular aerobic exercise such as walking Keep sodium intake < 2500 mg per day.

## 2016-08-25 NOTE — Progress Notes (Signed)
Pre visit review using our clinic review tool, if applicable. No additional management support is needed unless otherwise documented below in the visit note. 

## 2016-08-25 NOTE — Progress Notes (Signed)
Subjective:     Patient ID: Frank Joseph, male   DOB: 06-25-1955, 61 y.o.   MRN: [id number]  HPI Patient seen for elevated blood pressure. He has history of hypertension currently treated with very low-dose lisinopril 5 mg one half tablet daily and bisoprolol once daily. He went to dentist yesterday and had blood pressure 160/100. When he returned home later in the day had blood pressure of 134/87. Blood pressure back up this morning. Generally feels well. No headaches. No dizziness. No chest pains. He states he's had tendency to obsess about blood pressure in the past. Possible white coat syndrome.  He does drink sometimes 2-3 beers per day but not on daily basis. Tries to watch sodium intake. No decongestant use.  Past Medical History:  Diagnosis Date  . Headache(784.0)   . Hyperlipidemia   . Hypertension   . Sinusitis    Past Surgical History:  Procedure Laterality Date  . colonscopy      reports that he has never smoked. He has never used smokeless tobacco. He reports that he drinks alcohol. He reports that he does not use drugs. family history includes Glaucoma in his father; Hypertension in his mother. No Known Allergies   Review of Systems  Constitutional: Negative for fatigue.  Eyes: Negative for visual disturbance.  Respiratory: Negative for cough, chest tightness and shortness of breath.   Cardiovascular: Negative for chest pain, palpitations and leg swelling.  Neurological: Negative for dizziness, syncope, weakness, light-headedness and headaches.       Objective:   Physical Exam  Constitutional: He is oriented to person, place, and time. He appears well-developed and well-nourished.  HENT:  Right Ear: External ear normal.  Left Ear: External ear normal.  Mouth/Throat: Oropharynx is clear and moist.  Eyes: Pupils are equal, round, and reactive to light.  Neck: Neck supple. No thyromegaly present.  Cardiovascular: Normal rate and regular rhythm.    Pulmonary/Chest: Effort normal and breath sounds normal. No respiratory distress. He has no wheezes. He has no rales.  Musculoskeletal: He exhibits no edema.  Neurological: He is alert and oriented to person, place, and time.       Assessment:     Hypertension. Probable white coat syndrome. Blood pressure here initially 160/100 and repeat after rest 148/88    Plan:     -Increase lisinopril 5 mg to one whole tablet daily -Continue regular aerobic exercise -Continue sodium restriction -Monitor blood pressure and follow-up with primary if consistent home readings greater than 140/90 -limit ETOH intake.  Eulas Post MD [facility]

## 2016-11-04 ENCOUNTER — Ambulatory Visit (INDEPENDENT_AMBULATORY_CARE_PROVIDER_SITE_OTHER): Payer: BLUE CROSS/BLUE SHIELD | Admitting: Family Medicine

## 2016-11-04 ENCOUNTER — Encounter: Payer: Self-pay | Admitting: Family Medicine

## 2016-11-04 DIAGNOSIS — I10 Essential (primary) hypertension: Secondary | ICD-10-CM

## 2016-11-04 NOTE — Progress Notes (Signed)
Subjective:  Frank Joseph is a 61 y.o. year old very pleasant male patient who presents for/with See problem oriented charting ROS- No chest pain or shortness of breath. No headache or blurry vision.  Occasional lightheadedness    Past Medical History-  Patient Active Problem List   Diagnosis Date Noted  . Essential hypertension 12/09/2009    Priority: Medium  . Hyperlipidemia 03/07/2007    Priority: Medium  . Migraine 12/08/2006    Priority: Medium  . Hyperglycemia 05/21/2016    Priority: Low  . Thrombocytopenia ([place]) 05/21/2016    Priority: Low  . Allergic rhinitis 01/04/2014    Priority: Low  . Anxiety state, unspecified 05/25/2010    Priority: Low    Medications- reviewed and updated Current Outpatient Prescriptions  Medication Sig Dispense Refill  . aspirin 81 MG tablet Take 81 mg by mouth daily.    . bisoprolol-hydrochlorothiazide (ZIAC) 10-6.25 MG tablet Take 1 tablet by mouth daily. 90 tablet 3  . ezetimibe-simvastatin (VYTORIN) 10-20 MG tablet Take 1 tablet by mouth at bedtime. 90 tablet 1  . lisinopril (PRINIVIL,ZESTRIL) 5 MG tablet Take 1 tablet (5 mg total) by mouth daily. (Patient taking differently: Take 5 mg by mouth daily. Taking one daily) 90 tablet 3  . multivitamin (THERAGRAN) per tablet Take 1 tablet by mouth daily.      . SUMAtriptan (IMITREX) 25 MG tablet Take 1 tablet (25 mg total) by mouth every 2 (two) hours as needed. 10 tablet 6  . triamcinolone (NASACORT) 55 MCG/ACT AERO nasal inhaler Place 2 sprays into the nose daily.    . cetirizine (ZYRTEC) 10 MG tablet Take 10 mg by mouth daily.     No current facility-administered medications for this visit.     Objective: BP (!) 142/83   Pulse 70   Temp 98.1 F (36.7 C) (Oral)   Ht 5' 11.25" (1.81 m)   Wt 199 lb (90.3 kg)   SpO2 99%   BMI 27.56 kg/m  Gen: NAD, resting comfortably CV: RRR no murmurs rubs or gallops Lungs: CTAB no crackles, wheeze, rhonchi Abdomen:  soft/nontender/nondistended/normal bowel sounds. overweight Ext: no edema Skin: warm, dry Anxious when discussing blood pressure  Assessment/Plan:  Essential hypertension S: controlled on ziac 10-6.25mg and half of 5mg  lisinopril (had been taking half but increased to full tablet last visit). Home #s average 126/76 and home cuff verified previously. . ASCVD 10 year risk calculation if age 73-79: on vytorin BP Readings from Last 3 Encounters:  11/04/16 (!) 158/96--> 142/83 on repeat (average of 3 readings)  08/25/16 (!) 148/88  05/21/16 122/88  A/P: white coat element for in office measurements. We discussed blood pressure goal of <140/90 which he is well under at home. Continue current meds:  Continue weight loss efforts with target 188 (down 3 lbs this year)- February CPE planned. Counseling provided on several questions about blood pressure and we reviewed his home plan to control blood pressure- will scan in. Even though some home #s as high as 150- average is still well controlled and has several #s as low as 275 systolic and occasional lightheadedness so do not think he could tolerate lower #s  February CPE  The duration of face-to-face time during this visit was greater than 25 minutes. Greater than 50% of this time was spent in counseling, explanation of diagnosis, planning of further management, and/or coordination of care including reviewing his goal sheet for BP control, reviewing BP goals. Counseling him on anxiety surrounding BP.  Return precautions advised.  [name], MD

## 2016-11-04 NOTE — Assessment & Plan Note (Addendum)
S: controlled on ziac 10-6.25mg and half of 5mg  lisinopril (had been taking half but increased to full tablet last visit). Home #s average 126/76 and home cuff verified previously. . ASCVD 10 year risk calculation if age 62-79: on vytorin BP Readings from Last 3 Encounters:  11/04/16 (!) 158/96--> 142/83 on repeat (average of 3 readings)  08/25/16 (!) 148/88  05/21/16 122/88  A/P: white coat element for in office measurements. We discussed blood pressure goal of <140/90 which he is well under at home. Continue current meds:  Continue weight loss efforts with target 188 (down 3 lbs this year)- February CPE planned. Counseling provided on several questions about blood pressure and we reviewed his home plan to control blood pressure- will scan in. Even though some home #s as high as 150- average is still well controlled and has several #s as low as 115 systolic and occasional lightheadedness so do not think he could tolerate lower #s

## 2016-11-04 NOTE — Patient Instructions (Signed)
Home #s look great- Lets have you back if home averages over 135/85 over a week period- otherwise see you in February  Also happy to see you if more than 2-3 blood pressures in a row over 170 or any symptoms such as chest pain or shortness of breath  Hoping you can meet your weight goal by follow up physical

## 2016-11-11 ENCOUNTER — Other Ambulatory Visit: Payer: Self-pay | Admitting: Family Medicine

## 2017-02-01 ENCOUNTER — Other Ambulatory Visit: Payer: Self-pay | Admitting: Family Medicine

## 2017-05-06 ENCOUNTER — Encounter (HOSPITAL_BASED_OUTPATIENT_CLINIC_OR_DEPARTMENT_OTHER): Payer: Self-pay

## 2017-05-06 ENCOUNTER — Emergency Department (HOSPITAL_BASED_OUTPATIENT_CLINIC_OR_DEPARTMENT_OTHER)
Admission: EM | Admit: 2017-05-06 | Discharge: 2017-05-07 | Disposition: A | Discharge status: Home / Self Care | Payer: BLUE CROSS/BLUE SHIELD | Attending: Emergency Medicine | Admitting: Emergency Medicine

## 2017-05-06 DIAGNOSIS — W01190A Fall on same level from slipping, tripping and stumbling with subsequent striking against furniture, initial encounter: Secondary | ICD-10-CM | POA: Diagnosis not present

## 2017-05-06 DIAGNOSIS — Y999 Unspecified external cause status: Secondary | ICD-10-CM | POA: Diagnosis not present

## 2017-05-06 DIAGNOSIS — Y9389 Activity, other specified: Secondary | ICD-10-CM | POA: Diagnosis not present

## 2017-05-06 DIAGNOSIS — S01312A Laceration without foreign body of left ear, initial encounter: Secondary | ICD-10-CM | POA: Insufficient documentation

## 2017-05-06 DIAGNOSIS — I10 Essential (primary) hypertension: Secondary | ICD-10-CM | POA: Diagnosis not present

## 2017-05-06 DIAGNOSIS — Y92018 Other place in single-family (private) house as the place of occurrence of the external cause: Secondary | ICD-10-CM | POA: Insufficient documentation

## 2017-05-06 DIAGNOSIS — S0990XA Unspecified injury of head, initial encounter: Secondary | ICD-10-CM

## 2017-05-06 DIAGNOSIS — Z7982 Long term (current) use of aspirin: Secondary | ICD-10-CM | POA: Diagnosis not present

## 2017-05-06 DIAGNOSIS — Z79899 Other long term (current) drug therapy: Secondary | ICD-10-CM | POA: Diagnosis not present

## 2017-05-06 NOTE — ED Triage Notes (Signed)
Pt states fell and hit the corner of his office desk; denies LOC; lac to inside of left ear, abrasion to lt cheek; bleeding controlled

## 2017-05-06 NOTE — ED Notes (Signed)
ED Provider at bedside. 

## 2017-05-07 MED ORDER — BUPIVACAINE HCL 0.5 % IJ SOLN
50.0000 mL | Freq: Once | INTRAMUSCULAR | Status: AC
  Administered 2017-05-07: 1 mL
  Filled 2017-05-07: qty 1

## 2017-05-07 NOTE — ED Provider Notes (Signed)
[place] EMERGENCY DEPARTMENT Provider Note   CSN: [id number] Arrival date & time: 05/06/17  2334     History   Chief Complaint Chief Complaint  Patient presents with  . Laceration    lt ear    HPI DEWARD SEBEK is a 62 y.o. male.  HPI JARREN PARA is a 62 y.o. male with hx of headaches, presents to ED with complaint of a head injury. Pt admits to drinking alcohol this evening at home. He states he lost balance walking in his office and fell hitting his head on corner of the table. No LOC. Wife there to help him. Complaining of laceration to left ear and abrasion to left face. No treatment prior to coming in. No headache. No nausea or vomiting. No confusion. No other apparent injuries. Ambulatory with no problems.   Past Medical History:  Diagnosis Date  . Headache(784.0)   . Hyperlipidemia   . Hypertension   . Sinusitis     Patient Active Problem List   Diagnosis Date Noted  . Hyperglycemia 05/21/2016  . Thrombocytopenia ([place]) 05/21/2016  . Allergic rhinitis 01/04/2014  . Anxiety state, unspecified 05/25/2010  . Essential hypertension 12/09/2009  . Hyperlipidemia 03/07/2007  . Migraine 12/08/2006    Past Surgical History:  Procedure Laterality Date  . colonscopy         Home Medications    Prior to Admission medications   Medication Sig Start Date End Date Taking? Authorizing Provider  aspirin 81 MG tablet Take 81 mg by mouth daily.    [provider]  bisoprolol-hydrochlorothiazide (ZIAC) 10-6.25 MG tablet Take 1 tablet by mouth daily. 07/19/16   Marin Olp, MD  cetirizine (ZYRTEC) 10 MG tablet Take 10 mg by mouth daily.    [provider]  ezetimibe-simvastatin (VYTORIN) 10-20 MG tablet TAKE 1 TABLET BY MOUTH AT BEDTIME 02/01/17   Marin Olp, MD  lisinopril (PRINIVIL,ZESTRIL) 5 MG tablet Take 1 tablet (5 mg total) by mouth daily. Patient taking differently: Take 5 mg by mouth daily. Taking one daily  08/17/16   Marin Olp, MD  multivitamin [facility]) per tablet Take 1 tablet by mouth daily.      [provider]  SUMAtriptan (IMITREX) 25 MG tablet Take 1 tablet (25 mg total) by mouth every 2 (two) hours as needed. 06/18/13   Ricard Dillon, MD  triamcinolone (NASACORT) 55 MCG/ACT AERO nasal inhaler Place 2 sprays into the nose daily.    [provider]    Family History Family History  Problem Relation Age of Onset  . Hypertension Mother   . Glaucoma Father     Social History Social History   Tobacco Use  . Smoking status: Never Smoker  . Smokeless tobacco: Never Used  Substance Use Topics  . Alcohol use: Yes    Comment: 2-24  . Drug use: No     Allergies   Patient has no known allergies.   Review of Systems Review of Systems  Constitutional: Negative for chills and fever.  HENT: Positive for ear pain.   Respiratory: Negative for cough, chest tightness and shortness of breath.   Cardiovascular: Negative for chest pain, palpitations and leg swelling.  Gastrointestinal: Negative for abdominal distention, abdominal pain, diarrhea, nausea and vomiting.  Genitourinary: Negative for dysuria, frequency, hematuria and urgency.  Musculoskeletal: Negative for arthralgias, myalgias, neck pain and neck stiffness.  Skin: Positive for wound. Negative for rash.  Allergic/Immunologic: Negative for immunocompromised state.  Neurological: Positive  for headaches. Negative for dizziness, weakness, light-headedness and numbness.  All other systems reviewed and are negative.    Physical Exam Updated Vital Signs BP 130/88 (BP Location: Left Arm)   Pulse 71   Temp 98.1 F (36.7 C) (Oral)   Resp 18   SpO2 100%   Physical Exam  Constitutional: He is oriented to person, place, and time. He appears well-developed and well-nourished. No distress.  HENT:  Head: Normocephalic and atraumatic.  3cm irregular laceration to the antehelix of the ear, involves  cartiallage, does not penetrate through. Hemostatic. TMs normal bilaterally. 2cm superficial abrasion to left maxilla  Eyes: Conjunctivae and EOM are normal. Pupils are equal, round, and reactive to light.  Neck: Neck supple.  Cardiovascular: Normal rate, regular rhythm and normal heart sounds.  Pulmonary/Chest: Effort normal. No respiratory distress. He has no wheezes. He has no rales.  Abdominal: Soft. Bowel sounds are normal. He exhibits no distension. There is no tenderness. There is no rebound.  Musculoskeletal: He exhibits no edema.  Neurological: He is alert and oriented to person, place, and time.  5/5 and equal upper and lower extremity strength bilaterally. Equal grip strength bilaterally. Normal finger to nose and heel to shin. No pronator drift.   Skin: Skin is warm and dry.  Nursing note and vitals reviewed.    ED Treatments / Results  Labs (all labs ordered are listed, but only abnormal results are displayed) Labs Reviewed - No data to display  EKG  EKG Interpretation None       Radiology No results found.  Procedures .Marland KitchenLaceration Repair  Date/Time: 05/07/2017 12:45 AM  Performed by: Jeannett Senior, PA-C  Authorized by: Jeannett Senior, PA-C   Consent:    Consent obtained:  Verbal   Consent given by:  Patient   Risks discussed:  Infection, pain, retained foreign body, need for additional repair and poor cosmetic result   Alternatives discussed:  No treatment Anesthesia (see MAR for exact dosages):    Anesthesia method:  Nerve block   Block location:  Superior and inferior auricular   Block needle gauge:  25 G   Block anesthetic:  Bupivacaine 0.5% w/o epi   Block injection procedure:  Anatomic landmarks identified, anatomic landmarks palpated, negative aspiration for blood and introduced needle   Block outcome:  Anesthesia achieved Laceration details:    Location:  Ear   Ear location:  L ear   Length (cm):  3 Repair type:    Repair type:   Intermediate Pre-procedure details:    Preparation:  Patient was prepped and draped in usual sterile fashion Exploration:    Wound exploration: wound explored through full range of motion     Contaminated: no   Treatment:    Area cleansed with:  Betadine and saline   Amount of cleaning:  Standard   Irrigation solution:  Sterile saline   Irrigation method:  Syringe   Visualized foreign bodies/material removed: no   Subcutaneous repair:    Suture size:  4-0   Suture material:  Vicryl   Suture technique:  Simple interrupted (through cartillage)   Number of sutures:  1 Skin repair:    Repair method:  Sutures   Suture size:  5-0   Suture material:  Nylon   Suture technique:  Simple interrupted   Number of sutures:  5 Approximation:    Approximation:  Close   Vermilion border: well-aligned   Post-procedure details:    Dressing:  Antibiotic ointment and bulky dressing  Patient tolerance of procedure:  Tolerated well, no immediate complications   (including critical care time)  Medications Ordered in ED Medications  bupivacaine (MARCAINE) 0.5 % (with pres) injection 50 mL (not administered)     Initial Impression / Assessment and Plan / ED Course  I have reviewed the triage vital signs and the nursing notes.  Pertinent labs & imaging results that were available during my care of the patient were reviewed by me and considered in my medical decision making (see chart for details).    Patient in the emergency department after a fall, sustaining laceration to the left ear and a small abrasion to the left maxilla.  Laceration to the ear was through cartilage, repaired with sutures, after discussing it with Dr. Florina Ou who is seen patient as well.  Patient is slightly intoxicated, however able to carry on normal conversation, wife at bedside as well.  Patient is acting like his normal self other than intoxication, he has not had any headache, no loss of consciousness, no nausea or  vomiting, no confusion or amnesia.  He is refusing CT scan at this time, which I think is reasonable.  The laceration was repaired with sutures.  I was able to pull the lacerated cartilage together with absorbable suture.  Bulky pressure dressing applied after repair to prevent auricular hematoma.  Discussed care at home.  Will have patient follow-up with family doctor or return here as needed if any signs of infection and for suture removal.  Also discussed return precautions with his wife for his head injury. Last tdap last year.   Vitals:   05/06/17 2353  BP: 130/88  Pulse: 71  Resp: 18  Temp: 98.1 F (36.7 C)  TempSrc: Oral  SpO2: 100%     Final Clinical Impressions(s) / ED Diagnoses   Final diagnoses:  Laceration of antihelix of left ear, initial encounter  Injury of head, initial encounter    ED Discharge Orders    None      Jeannett Senior, PA-C 05/07/17 0054  Molpus, Jenny Reichmann, MD 05/07/17 [id number]

## 2017-05-07 NOTE — Discharge Instructions (Signed)
Pressure dressing on left ear to prevent swelling. Bacitracin or neosporin twice a day. Watch for signs of infection. Tylenol or advil for pain. Follow up in 7 days with pcp or here for suture removal. Return if any problems.

## 2017-05-07 NOTE — ED Notes (Signed)
Pt. Attempted to sign out pad not working.

## 2017-05-13 ENCOUNTER — Ambulatory Visit: Payer: BLUE CROSS/BLUE SHIELD | Admitting: Family Medicine

## 2017-05-13 ENCOUNTER — Encounter: Payer: Self-pay | Admitting: Family Medicine

## 2017-05-13 VITALS — BP 120/78 | HR 68 | Temp 98.3°F | Ht 71.25 in | Wt 201.6 lb

## 2017-05-13 DIAGNOSIS — Z23 Encounter for immunization: Secondary | ICD-10-CM | POA: Diagnosis not present

## 2017-05-13 DIAGNOSIS — Z4802 Encounter for removal of sutures: Secondary | ICD-10-CM

## 2017-05-13 DIAGNOSIS — I10 Essential (primary) hypertension: Secondary | ICD-10-CM | POA: Diagnosis not present

## 2017-05-13 NOTE — Assessment & Plan Note (Addendum)
S: controlled on Bisoprolol-hctz 10-6.25 mg, lisinopril 5mg   BP Readings from Last 3 Encounters:  05/13/17 120/78  05/06/17 130/88  11/04/16 (!) 142/83  A/P: We discussed blood pressure goal of <140/90. Continue current meds:  BP appears improved since retirement. We discussed reducing dose of lisinopril back to 2.5mg  to let BP run higher. Also discussed option of making sure to be well hydrated- he prefers second option and follow up at CPE. He was also worried about sinuses causing his lightheadedness- no obvious issue on exam

## 2017-05-13 NOTE — Progress Notes (Signed)
Subjective:  Frank Joseph is a 62 y.o. year old very pleasant male patient who presents for/with See problem oriented charting ROS- occasional lightheaded feeling with standing . No chest pain or shortness of breath. No expanding redness around the ear.   Past Medical History-  Patient Active Problem List   Diagnosis Date Noted  . Essential hypertension 12/09/2009    Priority: Medium  . Hyperlipidemia 03/07/2007    Priority: Medium  . Migraine 12/08/2006    Priority: Medium  . Hyperglycemia 05/21/2016    Priority: Low  . Thrombocytopenia ([place]) 05/21/2016    Priority: Low  . Allergic rhinitis 01/04/2014    Priority: Low  . Anxiety state, unspecified 05/25/2010    Priority: Low    Medications- reviewed and updated Current Outpatient Medications  Medication Sig Dispense Refill  . aspirin 81 MG tablet Take 81 mg by mouth daily.    . bisoprolol-hydrochlorothiazide (ZIAC) 10-6.25 MG tablet Take 1 tablet by mouth daily. 90 tablet 3  . cetirizine (ZYRTEC) 10 MG tablet Take 10 mg by mouth daily.    Marland Kitchen ezetimibe-simvastatin (VYTORIN) 10-20 MG tablet TAKE 1 TABLET BY MOUTH AT BEDTIME 90 tablet 1  . lisinopril (PRINIVIL,ZESTRIL) 5 MG tablet Take 1 tablet (5 mg total) by mouth daily. (Patient taking differently: Take 5 mg by mouth daily. Taking one daily) 90 tablet 3  . multivitamin (THERAGRAN) per tablet Take 1 tablet by mouth daily.      . SUMAtriptan (IMITREX) 25 MG tablet Take 1 tablet (25 mg total) by mouth every 2 (two) hours as needed. 10 tablet 6  . triamcinolone (NASACORT) 55 MCG/ACT AERO nasal inhaler Place 2 sprays into the nose daily.     No current facility-administered medications for this visit.     Objective: BP 120/78 (BP Location: Left Arm, Patient Position: Sitting, Cuff Size: Large)   Pulse 68   Temp 98.3 F (36.8 C) (Oral)   Ht 5' 11.25" (1.81 m)   Wt 201 lb 9.6 oz (91.4 kg)   SpO2 98%   BMI 27.92 kg/m  Gen: NAD, resting comfortably Oropharynx and nasal  turbinates largely normal. Left outer ear with 5 sutures which were removed- healing well CV: RRR no murmurs rubs or gallops Lungs: CTAB no crackles, wheeze, rhonchi Ext: no edema Skin: warm, dry  Assessment/Plan:  Visit for suture removal S: Patient was seen in the ER on 05/06/17 after he ws drinking alcohol at home and lost balance while walking into his office- and fell and hit his head on the corner of his table. He did not lose consciousness. Wife was there with him. He had a laceration to left ear and abrasion of left face. He was asymptomatic other than the laceration when he arrived in ER. He was given a superior and inferior auricular nerve block for a 3 cm lacerationHad 1 simple interupted suture subcutaneously with vicryl (through cartillage which was lacerated). He also had 5 sutures superficially with nylon per ER report. Patient refused Head CT.  A/P: removed 5 sutures- ear looks great. Will see him back at CPE. Aftercare discussed  Essential hypertension S: controlled on Bisoprolol-hctz 10-6.25 mg, lisinopril 5mg   BP Readings from Last 3 Encounters:  05/13/17 120/78  05/06/17 130/88  11/04/16 (!) 142/83  A/P: We discussed blood pressure goal of <140/90. Continue current meds:  BP appears improved since retirement. We discussed reducing dose of lisinopril back to 2.5mg  to let BP run higher. Also discussed option of making sure to be well  hydrated- he prefers second option and follow up at CPE. He was also worried about sinuses causing his lightheadedness- no obvious issue on exam   Future Appointments  Date Time Provider [place]  05/27/2017  8:15 AM Marin Olp, MD [facility]  physical- come fasting  Lab/Order associations: Need for prophylactic vaccination and inoculation against influenza - Plan: Flu Vaccine QUAD 36+ mos IM  Return precautions advised.  [name], MD

## 2017-05-13 NOTE — Patient Instructions (Signed)
Ear looks good  1 good craft beer per day maximum  Increase water intake- at least 50 oz a day, cut down on tea- will probably feel better

## 2017-05-27 ENCOUNTER — Ambulatory Visit (INDEPENDENT_AMBULATORY_CARE_PROVIDER_SITE_OTHER): Payer: BLUE CROSS/BLUE SHIELD | Admitting: Family Medicine

## 2017-05-27 ENCOUNTER — Encounter: Payer: Self-pay | Admitting: Family Medicine

## 2017-05-27 ENCOUNTER — Encounter: Payer: Self-pay | Admitting: Gastroenterology

## 2017-05-27 VITALS — BP 136/92 | HR 68 | Temp 98.2°F | Ht 71.25 in | Wt 203.4 lb

## 2017-05-27 DIAGNOSIS — Z1211 Encounter for screening for malignant neoplasm of colon: Secondary | ICD-10-CM

## 2017-05-27 DIAGNOSIS — Z0000 Encounter for general adult medical examination without abnormal findings: Secondary | ICD-10-CM | POA: Diagnosis not present

## 2017-05-27 DIAGNOSIS — Z125 Encounter for screening for malignant neoplasm of prostate: Secondary | ICD-10-CM | POA: Diagnosis not present

## 2017-05-27 DIAGNOSIS — D696 Thrombocytopenia, unspecified: Secondary | ICD-10-CM

## 2017-05-27 DIAGNOSIS — Z1159 Encounter for screening for other viral diseases: Secondary | ICD-10-CM

## 2017-05-27 DIAGNOSIS — E785 Hyperlipidemia, unspecified: Secondary | ICD-10-CM | POA: Diagnosis not present

## 2017-05-27 DIAGNOSIS — Z114 Encounter for screening for human immunodeficiency virus [HIV]: Secondary | ICD-10-CM

## 2017-05-27 DIAGNOSIS — I10 Essential (primary) hypertension: Secondary | ICD-10-CM | POA: Diagnosis not present

## 2017-05-27 DIAGNOSIS — G43809 Other migraine, not intractable, without status migrainosus: Secondary | ICD-10-CM

## 2017-05-27 LAB — COMPREHENSIVE METABOLIC PANEL
ALT: 31 U/L (ref 0–53)
AST: 24 U/L (ref 0–37)
Albumin: 4.7 g/dL (ref 3.5–5.2)
Alkaline Phosphatase: 55 U/L (ref 39–117)
BILIRUBIN TOTAL: 1.2 mg/dL (ref 0.2–1.2)
BUN: 17 mg/dL (ref 6–23)
CO2: 29 meq/L (ref 19–32)
Calcium: 9.5 mg/dL (ref 8.4–10.5)
Chloride: 101 mEq/L (ref 96–112)
Creatinine, Ser: 1.14 mg/dL (ref 0.40–1.50)
GFR: 69.13 mL/min (ref >60.00)
GLUCOSE: 116 mg/dL — AB (ref 70–99)
Potassium: 4.1 mEq/L (ref 3.5–5.1)
SODIUM: 139 meq/L (ref 135–145)
TOTAL PROTEIN: 7.3 g/dL (ref 6.0–8.3)

## 2017-05-27 LAB — LIPID PANEL
Cholesterol: 132 mg/dL (ref 0–200)
HDL: 41.8 mg/dL (ref >39.00)
NONHDL: 89.82
Total CHOL/HDL Ratio: 3
Triglycerides: 287 mg/dL — ABNORMAL HIGH (ref 0.0–149.0)
VLDL: 57.4 mg/dL — ABNORMAL HIGH (ref 0.0–40.0)

## 2017-05-27 LAB — CBC
HCT: 48.3 % (ref 39.0–52.0)
Hemoglobin: 16.2 g/dL (ref 13.0–17.0)
MCHC: 33.5 g/dL (ref 30.0–36.0)
MCV: 96.4 fl (ref 78.0–100.0)
Platelets: 141 10*3/uL — ABNORMAL LOW (ref 150.0–400.0)
RBC: 5.01 Mil/uL (ref 4.22–5.81)
RDW: 12.5 % (ref 11.5–15.5)
WBC: 4.8 10*3/uL (ref 4.0–10.5)

## 2017-05-27 LAB — LDL CHOLESTEROL, DIRECT: Direct LDL: 40 mg/dL

## 2017-05-27 LAB — PSA: PSA: 1.12 ng/mL (ref 0.10–4.00)

## 2017-05-27 NOTE — Patient Instructions (Addendum)
Please stop by lab before you go  I would advise checking in on blood pressure at 6 months- keep monitoring at home

## 2017-05-27 NOTE — Progress Notes (Signed)
Phone: [phone]  Subjective:  Patient presents today for their annual physical. Chief complaint-noted.   See problem oriented charting- ROS- full  review of systems was completed and negative except for: still sinus congestion and intermittent, also feels anxious with physicals  The following were reviewed and entered/updated in epic: Past Medical History:  Diagnosis Date  . Headache(784.0)   . Hyperlipidemia   . Hypertension   . Sinusitis    Patient Active Problem List   Diagnosis Date Noted  . Essential hypertension 12/09/2009    Priority: Medium  . Hyperlipidemia 03/07/2007    Priority: Medium  . Migraine 12/08/2006    Priority: Medium  . Hyperglycemia 05/21/2016    Priority: Low  . Thrombocytopenia ([place]) 05/21/2016    Priority: Low  . Allergic rhinitis 01/04/2014    Priority: Low  . Anxiety state, unspecified 05/25/2010    Priority: Low   Past Surgical History:  Procedure Laterality Date  . colonscopy      Family History  Problem Relation Age of Onset  . Hypertension Mother   . Glaucoma Father     Medications- reviewed and updated Current Outpatient Medications  Medication Sig Dispense Refill  . aspirin 81 MG tablet Take 81 mg by mouth daily.    . bisoprolol-hydrochlorothiazide (ZIAC) 10-6.25 MG tablet Take 1 tablet by mouth daily. 90 tablet 3  . ezetimibe-simvastatin (VYTORIN) 10-20 MG tablet TAKE 1 TABLET BY MOUTH AT BEDTIME 90 tablet 1  . lisinopril (PRINIVIL,ZESTRIL) 5 MG tablet Take 1 tablet (5 mg total) by mouth daily. (Patient taking differently: Take 5 mg by mouth daily. Taking one daily) 90 tablet 3  . loratadine (CLARITIN) 10 MG tablet Take 10 mg by mouth daily.    . multivitamin (THERAGRAN) per tablet Take 1 tablet by mouth daily.      . SUMAtriptan (IMITREX) 25 MG tablet Take 1 tablet (25 mg total) by mouth every 2 (two) hours as needed. 10 tablet 6  . triamcinolone (NASACORT) 55 MCG/ACT AERO nasal inhaler Place 2 sprays into the nose daily.      Allergies-reviewed and updated No Known Allergies  Social History Narrative   Married 35 years in 2015. 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    Played baseball at [employer]. Wife went to [employer].       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 Oropharynx normal Neck: no thyromegaly CV: RRR no murmurs rubs or gallops Lungs: CTAB no crackles, wheeze, rhonchi Abdomen: soft/nontender/nondistended/normal bowel sounds. No rebound or guarding.  Ext: no edema Skin: warm, dry Neuro: grossly normal, moves all extremities, PERRLA Rectal: normal tone, normal sized prostate, no masses or tenderness  Assessment/Plan:  62 y.o. male presenting for annual physical.  Health Maintenance counseling: 1. Anticipatory guidance: Patient counseled regarding regular dental exams -q6 months, eye exams -yearly, wearing seatbelts.  2. Risk factor reduction:  Advised patient of need for regular exercise and diet rich and fruits and vegetables to reduce risk of heart attack and stroke. Goal has been to  get closer to 190 on our scales. Has cut down on alchol- only 5 beers in last month. Has started doing stationary bike 30-50 mins a night while watching tv- gets HR up into 90s to 100s on this. Playing drums everyday and this is very active. Less golf due to weather. Eats out breakfast and dinner- discussed could improve diet by eating at home more.  Wt Readings from Last 3 Encounters:  05/27/17 203 lb 6.4 oz (92.3 kg)  05/13/17 201 lb 9.6 oz (91.4 kg)  11/04/16 199 lb (90.3 kg)  3.  Immunizations/screenings/ancillary studies- discussed shingrix availability issues. Will do HCV and HIV screening.  Immunization History  Administered Date(s) Administered  . Influenza Split 04/23/2011  . Influenza Whole 01/03/2009  . Influenza,inj,Quad PF,6+ Mos 01/04/2014, 01/22/2015, 05/25/2016, 05/13/2017  . Td 04/26/2006  . Tdap 05/25/2016  4. Prostate cancer screening-  Low risk PSA trend- update today. Rectal exam low risk. Nocturia once a night still - sometimes not at all if cuts fluids off early Lab Results  Component Value Date   PSA 0.97 05/14/2016   PSA 1.19 08/09/2014   PSA 1.33 12/11/2013   5. Colon cancer screening - 08/2006- needs repeat- referred back today.  6. Skin cancer screening- no dermatologist- waist up exam reassuring again today (only benign lesions such as seborrheic keratosis). advised regular sunscreen use. Denies worrisome, changing, or new skin lesions.   Status of chronic or acute concerns   HLD- doing well on vytorin- last year- update lipids today  Migraines- has imitrex on hand- has done well with this- very rare use  Thrombocytopenia- very mild and stable over years- update today  Allergic rhinitis- zyrtec or loratadine seems to help most.  SE dymista= taste issues. Has used nasocort in past  Monitor CBGs- has had some elevations in past but last a1c in 2016 normal  1 year physical. Would advise 6 month BP check  Lab/Order associations: Screen for colon cancer - Plan: Ambulatory referral to Gastroenterology  Screening for prostate cancer - Plan: PSA  Essential hypertension - Plan: CBC, Comprehensive metabolic panel, Lipid panel  Thrombocytopenia ([place]) - Plan: CBC  Hyperlipidemia, unspecified hyperlipidemia type - Plan: CBC, Comprehensive metabolic panel, Lipid panel  Encounter for HCV screening test for low risk patient - Plan: Hepatitis C antibody  Screening for HIV without presence of risk factors - Plan: HIV antibody  Return  precautions advised.  [name], MD

## 2017-05-27 NOTE — Assessment & Plan Note (Signed)
HTN- controlled on lisinopril 5mg  an ziac with home #s controlled usually 120s over 70s.  BP Readings from Last 3 Encounters:  05/27/17 (!) 136/92 - my repeat 140/90  05/13/17 120/78  05/06/17 130/88  Good control at home- will not change meds- repeat 6 months

## 2017-05-28 LAB — HIV ANTIBODY (ROUTINE TESTING W REFLEX): HIV 1&2 Ab, 4th Generation: NONREACTIVE

## 2017-05-28 LAB — HEPATITIS C ANTIBODY
Hepatitis C Ab: NONREACTIVE
SIGNAL TO CUT-OFF: 0.02 (ref <1.00)

## 2017-06-24 DIAGNOSIS — H5213 Myopia, bilateral: Secondary | ICD-10-CM | POA: Diagnosis not present

## 2017-06-24 LAB — HM DIABETES EYE EXAM

## 2017-07-09 ENCOUNTER — Other Ambulatory Visit: Payer: Self-pay | Admitting: Family Medicine

## 2017-07-11 ENCOUNTER — Other Ambulatory Visit: Payer: Self-pay | Admitting: Family Medicine

## 2017-07-19 ENCOUNTER — Encounter: Payer: Self-pay | Admitting: Gastroenterology

## 2017-07-19 ENCOUNTER — Other Ambulatory Visit: Payer: Self-pay

## 2017-07-19 ENCOUNTER — Ambulatory Visit (AMBULATORY_SURGERY_CENTER): Payer: Self-pay | Admitting: *Deleted

## 2017-07-19 VITALS — Ht 72.0 in | Wt 203.6 lb

## 2017-07-19 DIAGNOSIS — Z1211 Encounter for screening for malignant neoplasm of colon: Secondary | ICD-10-CM

## 2017-07-19 MED ORDER — NA SULFATE-K SULFATE-MG SULF 17.5-3.13-1.6 GM/177ML PO SOLN: 1.0000 | Freq: Once | ORAL | 0 refills | Status: AC

## 2017-07-19 NOTE — Progress Notes (Signed)
No egg or soy allergy known to patient  No issues with past sedation with any surgeries  or procedures, no intubation problems  No diet pills per patient No home 02 use per patient  No blood thinners per patient  Pt denies issues with constipation  No A fib or A flutter  EMMI video sent to pt's e mail - pt declined Pt given a pay no more than $50 coupon today- informed we do not do PA in case [facility] says we need to and why-

## 2017-08-02 ENCOUNTER — Ambulatory Visit (AMBULATORY_SURGERY_CENTER): Payer: BLUE CROSS/BLUE SHIELD | Admitting: Gastroenterology

## 2017-08-02 ENCOUNTER — Other Ambulatory Visit: Payer: Self-pay

## 2017-08-02 ENCOUNTER — Encounter: Payer: Self-pay | Admitting: Gastroenterology

## 2017-08-02 VITALS — BP 116/95 | HR 54 | Temp 99.1°F | Resp 12 | Ht 71.0 in | Wt 199.0 lb

## 2017-08-02 DIAGNOSIS — D125 Benign neoplasm of sigmoid colon: Secondary | ICD-10-CM

## 2017-08-02 DIAGNOSIS — Z1211 Encounter for screening for malignant neoplasm of colon: Secondary | ICD-10-CM | POA: Diagnosis not present

## 2017-08-02 DIAGNOSIS — Z86010 Personal history of colonic polyps: Secondary | ICD-10-CM | POA: Diagnosis not present

## 2017-08-02 DIAGNOSIS — D124 Benign neoplasm of descending colon: Secondary | ICD-10-CM

## 2017-08-02 MED ORDER — SODIUM CHLORIDE 0.9 % IV SOLN: 500.0000 mL | Freq: Once | INTRAVENOUS | Status: DC

## 2017-08-02 NOTE — Patient Instructions (Signed)
YOU HAD AN ENDOSCOPIC PROCEDURE TODAY AT [facility]:   Refer to the procedure report that was given to you for any specific questions about what was found during the examination.  If the procedure report does not answer your questions, please call your gastroenterologist to clarify.  If you requested that your care partner not be given the details of your procedure findings, then the procedure report has been included in a sealed envelope for you to review at your convenience later.  YOU SHOULD EXPECT: Some feelings of bloating in the abdomen. Passage of more gas than usual.  Walking can help get rid of the air that was put into your GI tract during the procedure and reduce the bloating. If you had a lower endoscopy (such as a colonoscopy or flexible sigmoidoscopy) you may notice spotting of blood in your stool or on the toilet paper. If you underwent a bowel prep for your procedure, you may not have a normal bowel movement for a few days.  Please Note:  You might notice some irritation and congestion in your nose or some drainage.  This is from the oxygen used during your procedure.  There is no need for concern and it should clear up in a day or so.  SYMPTOMS TO REPORT IMMEDIATELY:   Following lower endoscopy (colonoscopy or flexible sigmoidoscopy):  Excessive amounts of blood in the stool  Significant tenderness or worsening of abdominal pains  Swelling of the abdomen that is new, acute  Fever of 100F or higher  For urgent or emergent issues, a gastroenterologist can be reached at any hour by calling [phone].   DIET:  We do recommend a small meal at first, but then you may proceed to your regular diet.  Drink plenty of fluids but you should avoid alcoholic beverages for 24 hours.  ACTIVITY:  You should plan to take it easy for the rest of today and you should NOT DRIVE or use heavy machinery until tomorrow (because of the sedation medicines used during the test).     FOLLOW UP: Our staff will call the number listed on your records the next business day following your procedure to check on you and address any questions or concerns that you may have regarding the information given to you following your procedure. If we do not reach you, we will leave a message.  However, if you are feeling well and you are not experiencing any problems, there is no need to return our call.  We will assume that you have returned to your regular daily activities without incident.  If any biopsies were taken you will be contacted by phone or by letter within the next 1-3 weeks.  Please call us at [phone] if you have not heard about the biopsies in 3 weeks.    SIGNATURES/CONFIDENTIALITY: You and/or your care partner have signed paperwork which will be entered into your electronic medical record.  These signatures attest to the fact that that the information above on your After Visit Summary has been reviewed and is understood.  Full responsibility of the confidentiality of this discharge information lies with you and/or your care-partner.    Handouts were given to your care partner on polyps,hemorrhoids, and a high fiber diet with liberal fluid intake. You may resume your current medications today. Await biopsy results. Please call if any questions or concerns.

## 2017-08-02 NOTE — Progress Notes (Signed)
Report given to PACU, vss 

## 2017-08-02 NOTE — Progress Notes (Signed)
Called to room to assist during endoscopic procedure.  Patient ID and intended procedure confirmed with present staff. Received instructions for my participation in the procedure from the performing physician.  

## 2017-08-02 NOTE — Op Note (Signed)
[place] Patient Name: Frank Joseph Procedure Date: 08/02/2017 10:13 AM MRN: [id number] Endoscopist: Ladene Artist , MD Age: 62 Referring MD:  Date of Birth: 25-Nov-1955 Gender: Male Account #: [id number] Procedure:                Colonoscopy Indications:              Screening for colorectal malignant neoplasm Medicines:                Monitored Anesthesia Care Procedure:                Pre-Anesthesia Assessment:                           - Prior to the procedure, a History and Physical                            was performed, and patient medications and                            allergies were reviewed. The patient's tolerance of                            previous anesthesia was also reviewed. The risks                            and benefits of the procedure and the sedation                            options and risks were discussed with the patient.                            All questions were answered, and informed consent                            was obtained. Prior Anticoagulants: The patient has                            taken no previous anticoagulant or antiplatelet                            agents. ASA Grade Assessment: II - A patient with                            mild systemic disease. After reviewing the risks                            and benefits, the patient was deemed in                            satisfactory condition to undergo the procedure.                           After obtaining informed consent, the colonoscope  was passed under direct vision. Throughout the                            procedure, the patient's blood pressure, pulse, and                            oxygen saturations were monitored continuously. The                            Model PCF-H190DL [id number]) scope was introduced                            through the anus and advanced to the the cecum,                            identified by  appendiceal orifice and ileocecal                            valve. The ileocecal valve, appendiceal orifice,                            and rectum were photographed. The quality of the                            bowel preparation was excellent. The colonoscopy                            was performed without difficulty. The patient                            tolerated the procedure well. Scope In: 10:26:10 AM Scope Out: 10:37:38 AM Scope Withdrawal Time: 0 hours 10 minutes 16 seconds  Total Procedure Duration: 0 hours 11 minutes 28 seconds  Findings:                 The perianal and digital rectal examinations were                            normal.                           Two sessile polyps were found in the sigmoid colon                            and descending colon. The polyps were 6 to 7 mm in                            size. These polyps were removed with a cold snare.                            Resection and retrieval were complete.                           Internal hemorrhoids were found during  retroflexion. The hemorrhoids were small and Grade                            I (internal hemorrhoids that do not prolapse).                           The exam was otherwise without abnormality on                            direct and retroflexion views. Complications:            No immediate complications. Estimated blood loss:                            None. Estimated Blood Loss:     Estimated blood loss: none. Impression:               - Two 6 to 7 mm polyps in the sigmoid colon and in                            the descending colon, removed with a cold snare.                            Resected and retrieved.                           - Internal hemorrhoids.                           - The examination was otherwise normal on direct                            and retroflexion views. Recommendation:           - Repeat colonoscopy in 5 years for  surveillance if                            polyp(s) are precancerous, otherwise 10 years.                           - Patient has a contact number available for                            emergencies. The signs and symptoms of potential                            delayed complications were discussed with the                            patient. Return to normal activities tomorrow.                            Written discharge instructions were provided to the                            patient.                           -  Resume previous diet.                           - Continue present medications.                           - Await pathology results. Ladene Artist, MD 08/02/2017 10:42:32 AM This report has been signed electronically.

## 2017-08-02 NOTE — Progress Notes (Signed)
Pt's states no medical or surgical changes since previsit or office visit. 

## 2017-08-02 NOTE — Progress Notes (Signed)
No problems noted in the recovery room. maw 

## 2017-08-03 ENCOUNTER — Telehealth: Payer: Self-pay | Admitting: *Deleted

## 2017-08-03 ENCOUNTER — Telehealth: Payer: Self-pay

## 2017-08-03 NOTE — Telephone Encounter (Signed)
  Follow up Call-  Call back number 08/02/2017  Post procedure Call Back phone  # [phone]  Permission to leave phone message Yes  Some recent data might be hidden     Patient questions:  Do you have a fever, pain , or abdominal swelling? No. Pain Score  0 *  Have you tolerated food without any problems? Yes.    Have you been able to return to your normal activities? Yes.    Do you have any questions about your discharge instructions: Diet   No. Medications  No. Follow up visit  No.  Do you have questions or concerns about your Care? No.  Actions: * If pain score is 4 or above: No action needed, pain <4.

## 2017-08-03 NOTE — Telephone Encounter (Signed)
Called [phone] and left a messaged we tried to reach pt for a follow up call. maw

## 2017-08-11 ENCOUNTER — Other Ambulatory Visit: Payer: Self-pay | Admitting: Family Medicine

## 2017-08-12 ENCOUNTER — Encounter: Payer: Self-pay | Admitting: Gastroenterology

## 2017-08-15 ENCOUNTER — Encounter: Payer: Self-pay | Admitting: Family Medicine

## 2017-08-15 ENCOUNTER — Ambulatory Visit: Payer: BLUE CROSS/BLUE SHIELD | Admitting: Family Medicine

## 2017-08-15 VITALS — BP 134/86 | HR 68 | Temp 98.8°F | Ht 71.0 in | Wt 200.4 lb

## 2017-08-15 DIAGNOSIS — I10 Essential (primary) hypertension: Secondary | ICD-10-CM

## 2017-08-15 DIAGNOSIS — R5383 Other fatigue: Secondary | ICD-10-CM

## 2017-08-15 DIAGNOSIS — R42 Dizziness and giddiness: Secondary | ICD-10-CM | POA: Diagnosis not present

## 2017-08-15 DIAGNOSIS — R7989 Other specified abnormal findings of blood chemistry: Secondary | ICD-10-CM

## 2017-08-15 DIAGNOSIS — Z86010 Personal history of colonic polyps: Secondary | ICD-10-CM | POA: Insufficient documentation

## 2017-08-15 NOTE — Progress Notes (Signed)
Subjective:  Frank Joseph. is a 62 y.o. year old very pleasant male patient who presents for/with See problem oriented charting ROS- No chest pain or shortness of breath. No headache or blurry vision.  Does note some lightheadedness and fatigue.  Past Medical History-  Patient Active Problem List   Diagnosis Date Noted  . Essential hypertension 12/09/2009    Priority: Medium  . Hyperlipidemia 03/07/2007    Priority: Medium  . Migraine 12/08/2006    Priority: Medium  . Hyperglycemia 05/21/2016    Priority: Low  . Thrombocytopenia ([place]) 05/21/2016    Priority: Low  . Allergic rhinitis 01/04/2014    Priority: Low  . Anxiety state, unspecified 05/25/2010    Priority: Low    Medications- reviewed and updated Current Outpatient Medications  Medication Sig Dispense Refill  . aspirin 81 MG tablet Take 81 mg by mouth daily.    . bisoprolol-hydrochlorothiazide (ZIAC) 10-6.25 MG tablet TAKE 1 TABLET BY MOUTH DAILY 90 tablet 1  . ezetimibe-simvastatin (VYTORIN) 10-20 MG tablet TAKE 1 TABLET BY MOUTH AT BEDTIME 90 tablet 1  . lisinopril (PRINIVIL,ZESTRIL) 5 MG tablet TAKE 1 TABLET(5 MG) BY MOUTH DAILY 90 tablet 1  . loratadine (CLARITIN) 10 MG tablet Take 10 mg by mouth daily.    . multivitamin (THERAGRAN) per tablet Take 1 tablet by mouth daily.      . SUMAtriptan (IMITREX) 25 MG tablet Take 1 tablet (25 mg total) by mouth every 2 (two) hours as needed. 10 tablet 6  . triamcinolone (NASACORT) 55 MCG/ACT AERO nasal inhaler Place 2 sprays into the nose daily.     Objective: BP 134/86 (BP Location: Left Arm, Patient Position: Sitting, Cuff Size: Normal)   Pulse 68   Temp 98.8 F (37.1 C) (Oral)   Ht 5\' 11"  (1.803 m)   Wt 200 lb 6.4 oz (90.9 kg)   SpO2 98%   BMI 27.95 kg/m  Gen: NAD, resting comfortably Tympanic membrane normal bilaterally.  Some clear drainage in both nares.  No obvious thyromegaly CV: RRR no murmurs rubs or gallops Lungs: CTAB no crackles, wheeze,  rhonchi Abdomen: soft/nontender/nondistended/normal bowel sounds.  Ext: no edema Skin: warm, dry Neuro: normal gait and speech  Assessment/Plan:  Mild lightheadedness. Also some fatigue S: some mild intermittent dizziness at least twice a week since about his physical in february. No room spinning. Sometimes notes around meals- usually before meals. Has noted at times when playing golf when he feels like this-that he feels better with peanut butter crackers. Has increased water and cut down on sodas. Still doing some beer.  Still on a diuretic and with increased water intake he was wondering about his potassium being low.Yesterday felt better when he had a banana. Of note also on lisinopril which should increase potassium.   He also has just felt more run down lately in general/fatigued.  When he feels dizzy-the fatigue does increase some. For energy- states 7 out of his normal 10. He wondered about this being allergies or sinus infection. quit zyrtec but later restarted claritin. On nasocort daily. clear drainage when gets it. Very mild sinus pain at times but not constant.  He also despite eating well and remaining very active has had a hard time losing weight.  He also has some mild constipation. Over a year ago did have slightly high tsh A/P: for dizziness portion- discussed could be low CBGs, advised to try a healthy snack between meals to see if this can be avoided  We  will also check TSH, T3, T4, CBC and CMP given his fatigue. I doubt allergic rhinitis is the primary driver for all the symptoms.   Could consider EKG in the future if currently having symptoms at time of visit or Holter monitor but doubt this is cardiac as improved with food  Essential hypertension S: controlled on home readings and here today. 123/77 avg over 25 tries at home.   BP Readings from Last 3 Encounters:  08/15/17 134/86  08/02/17 (!) 116/95  05/27/17 (!) 136/92  A/P: doing well today. We discussed blood  pressure goal of <140/90. Continue current meds:  ziac 10-6.25mg  and lisinopril 5mg  treatment   Future Appointments  Date Time Provider [place]  08/23/2017  3:45 PM Marin Olp, MD [facility]  11/24/2017  8:30 AM Marin Olp, MD [facility]  05/29/2018  8:15 AM Yong Channel Brayton Mars, MD [facility]   Lab/Order associations: Other fatigue - Plan: TSH, T4, free, T3, free, CBC, Comprehensive metabolic panel  High serum thyroid stimulating hormone (TSH) - Plan: TSH, T4, free, T3, free, CBC, Comprehensive metabolic panel  Time Stamp The duration of face-to-face time during this visit was greater than 25 minutes. Greater than 50% of this time was spent in counseling, explanation of diagnosis, planning of further management, and/or coordination of care including discussion of possible causes, explaining work up reasoning, providing comfort as he has an upcoming trip.   Return precautions advised.  [name], MD

## 2017-08-15 NOTE — Assessment & Plan Note (Signed)
S: controlled on home readings and here today. 123/77 avg over 25 tries at home.   BP Readings from Last 3 Encounters:  08/15/17 134/86  08/02/17 (!) 116/95  05/27/17 (!) 136/92  A/P: doing well today. We discussed blood pressure goal of <140/90. Continue current meds:  ziac 10-6.25mg  and lisinopril 5mg  treatment

## 2017-08-15 NOTE — Patient Instructions (Signed)
Try to plan a healthy snack between meals and see if that helps avoid those more fatigued/dizzy periods  We are going to check some labs to see if we can find reason for overall fatigue as well. Please let us know immediately if you have things like chest pain or shortness of breath (really glad you are not having those)

## 2017-08-16 LAB — CBC
HEMATOCRIT: 46.8 % (ref 39.0–52.0)
HEMOGLOBIN: 15.9 g/dL (ref 13.0–17.0)
MCHC: 34 g/dL (ref 30.0–36.0)
MCV: 94.2 fl (ref 78.0–100.0)
PLATELETS: 131 10*3/uL — AB (ref 150.0–400.0)
RBC: 4.97 Mil/uL (ref 4.22–5.81)
RDW: 13.1 % (ref 11.5–15.5)
WBC: 6.7 10*3/uL (ref 4.0–10.5)

## 2017-08-16 LAB — T4, FREE: Free T4: 0.73 ng/dL (ref 0.60–1.60)

## 2017-08-16 LAB — COMPREHENSIVE METABOLIC PANEL
ALBUMIN: 4.8 g/dL (ref 3.5–5.2)
ALK PHOS: 61 U/L (ref 39–117)
ALT: 32 U/L (ref 0–53)
AST: 28 U/L (ref 0–37)
BILIRUBIN TOTAL: 0.6 mg/dL (ref 0.2–1.2)
BUN: 16 mg/dL (ref 6–23)
CO2: 28 mEq/L (ref 19–32)
Calcium: 9.9 mg/dL (ref 8.4–10.5)
Chloride: 102 mEq/L (ref 96–112)
Creatinine, Ser: 1.38 mg/dL (ref 0.40–1.50)
GFR: 55.41 mL/min — AB (ref >60.00)
Glucose, Bld: 96 mg/dL (ref 70–99)
POTASSIUM: 4.3 meq/L (ref 3.5–5.1)
Sodium: 140 mEq/L (ref 135–145)
TOTAL PROTEIN: 7.5 g/dL (ref 6.0–8.3)

## 2017-08-16 LAB — T3, FREE: T3 FREE: 3.1 pg/mL (ref 2.3–4.2)

## 2017-08-16 LAB — TSH: TSH: 3.98 u[IU]/mL (ref 0.35–4.50)

## 2017-08-16 NOTE — Progress Notes (Signed)
No clear cause of fatigue found. Please return to see [place] if you have new or worsening symptoms-particularly chest pain or shortness of breath Your CBC was normal (blood counts, infection fighting cells, platelets). Your CMET was largely normal (kidney, liver, and electrolytes, blood sugar).  You did have a very mild decrease in your kidney function.  It appears you could have very mild dehydration.  Let us have you remain well hydrated and return for repeat bmet under hypertension. you also had slightly low platelets but you have had that for the last 5 years And it represents no change.   Your thyroid was normal in Regards to 3 separate tests.

## 2017-08-17 ENCOUNTER — Other Ambulatory Visit: Payer: Self-pay

## 2017-08-17 DIAGNOSIS — I10 Essential (primary) hypertension: Secondary | ICD-10-CM

## 2017-08-19 ENCOUNTER — Other Ambulatory Visit (INDEPENDENT_AMBULATORY_CARE_PROVIDER_SITE_OTHER): Payer: BLUE CROSS/BLUE SHIELD

## 2017-08-19 DIAGNOSIS — I10 Essential (primary) hypertension: Secondary | ICD-10-CM

## 2017-08-19 LAB — BASIC METABOLIC PANEL
BUN: 13 mg/dL (ref 6–23)
CALCIUM: 9.4 mg/dL (ref 8.4–10.5)
CO2: 30 meq/L (ref 19–32)
CREATININE: 1.05 mg/dL (ref 0.40–1.50)
Chloride: 101 mEq/L (ref 96–112)
GFR: 75.96 mL/min (ref >60.00)
GLUCOSE: 102 mg/dL — AB (ref 70–99)
Potassium: 4 mEq/L (ref 3.5–5.1)
SODIUM: 140 meq/L (ref 135–145)

## 2017-08-19 NOTE — Progress Notes (Signed)
Kidney function returned to normal -  great news!  Continue to remain well-hydrated

## 2017-08-22 ENCOUNTER — Telehealth: Payer: Self-pay

## 2017-08-22 NOTE — Telephone Encounter (Signed)
Called patient and verified he seen his MyChart message about his lab results and patient verified he did and no follow up questions were presented. Patient verbalized understanding.

## 2017-08-23 ENCOUNTER — Ambulatory Visit: Payer: BLUE CROSS/BLUE SHIELD | Admitting: Family Medicine

## 2017-08-23 ENCOUNTER — Encounter: Payer: Self-pay | Admitting: Family Medicine

## 2017-08-23 VITALS — BP 132/86 | HR 73 | Temp 98.2°F | Ht 71.0 in | Wt 199.8 lb

## 2017-08-23 DIAGNOSIS — H6122 Impacted cerumen, left ear: Secondary | ICD-10-CM | POA: Diagnosis not present

## 2017-08-23 DIAGNOSIS — R5383 Other fatigue: Secondary | ICD-10-CM | POA: Diagnosis not present

## 2017-08-23 DIAGNOSIS — R42 Dizziness and giddiness: Secondary | ICD-10-CM

## 2017-08-23 NOTE — Progress Notes (Signed)
Subjective:  Frank Joseph. is a 62 y.o. year old very pleasant male patient who presents for/with See problem oriented charting ROS-fatigue has improved.  Much less frequent dizziness.  No chest pain or shortness of breath.  Does feel like left ear fullness is worse and has some decreased hearing  Past Medical History-  Patient Active Problem List   Diagnosis Date Noted  . History of adenomatous polyp of colon 08/15/2017    Priority: Medium  . Essential hypertension 12/09/2009    Priority: Medium  . Hyperlipidemia 03/07/2007    Priority: Medium  . Migraine 12/08/2006    Priority: Medium  . Hyperglycemia 05/21/2016    Priority: Low  . Thrombocytopenia ([place]) 05/21/2016    Priority: Low  . Allergic rhinitis 01/04/2014    Priority: Low  . Anxiety state, unspecified 05/25/2010    Priority: Low    Medications- reviewed and updated Current Outpatient Medications  Medication Sig Dispense Refill  . aspirin 81 MG tablet Take 81 mg by mouth daily.    . bisoprolol-hydrochlorothiazide (ZIAC) 10-6.25 MG tablet TAKE 1 TABLET BY MOUTH DAILY 90 tablet 1  . ezetimibe-simvastatin (VYTORIN) 10-20 MG tablet TAKE 1 TABLET BY MOUTH AT BEDTIME 90 tablet 1  . lisinopril (PRINIVIL,ZESTRIL) 5 MG tablet TAKE 1 TABLET(5 MG) BY MOUTH DAILY 90 tablet 1  . loratadine (CLARITIN) 10 MG tablet Take 10 mg by mouth daily.    . multivitamin (THERAGRAN) per tablet Take 1 tablet by mouth daily.      . SUMAtriptan (IMITREX) 25 MG tablet Take 1 tablet (25 mg total) by mouth every 2 (two) hours as needed. 10 tablet 6  . triamcinolone (NASACORT) 55 MCG/ACT AERO nasal inhaler Place 2 sprays into the nose daily.     No current facility-administered medications for this visit.     Objective: BP 132/86 (BP Location: Left Arm, Patient Position: Sitting, Cuff Size: Normal)   Pulse 73   Temp 98.2 F (36.8 C) (Oral)   Ht 5\' 11"  (1.803 m)   Wt 199 lb 12.8 oz (90.6 kg)   SpO2 98%   BMI 27.87 kg/m  Gen: NAD,  resting comfortably Large cerumen impaction in left ear- able to curette out CV: RRR no murmurs rubs or gallops Lungs: CTAB no crackles, wheeze, rhonchi Abdomen: soft/nontender/nondistended/normal bowel sounds. Overweight Ext: no edema Skin: warm, dry  Assessment/Plan:  Dizziness/fatigue  Cerumen impaction  s: from last visit 8 days ago "He also has just felt more run down lately in general/fatigued.  When he feels dizzy-the fatigue does increase some. For energy- states 7 out of his normal 10. He wondered about this being allergies or sinus infection. quit zyrtec but later restarted claritin. On nasocort daily. clear drainage when gets it. Very mild sinus pain at times but not constant."  We thought patient may be mildly dehydrated.  Were also concerned about mild hypoglycemia.  I advised regular hydration and snacking between meals.  He states he has felt significant difference in the positive direction.  Yesterday did 8 hours of yardwork and felt great.  Playing golf daily. Has felt very mild dizziness when hungry on Sunday but that's the only time- but this was mild and he has had no significant fatigue or dizzy spells. Has stayed far better with hydration and some healthy snacks between meals. Has lost 1 lb which she is happy about  He denies sinus symptoms this time.  He does endorse some left ear fullness and mild decrease in hearing seems  to be worsening A/P: Dizziness and fatigue may have been related to dehydration and mild hypoglycemia.  This is improved with increased hydration and regular snacking.  He does have a left ear cerumen impaction.  We cleared this out with curetting.  He has improved hearing and resolution of left ear fullness   Future Appointments  Date Time Provider [place]  11/24/2017  8:30 AM Marin Olp, MD [facility]  05/29/2018  8:15 AM Yong Channel Brayton Mars, MD [facility]   Return precautions advised.  [name], MD

## 2017-08-23 NOTE — Patient Instructions (Signed)
Glad you are feeling better Keep up the good hydration Great job with snacking Blood pressure cooperating in office again!   Glad we could get that big ol ball of wax out

## 2017-11-24 ENCOUNTER — Ambulatory Visit: Payer: BLUE CROSS/BLUE SHIELD | Admitting: Family Medicine

## 2017-11-28 ENCOUNTER — Ambulatory Visit: Payer: BLUE CROSS/BLUE SHIELD | Admitting: Family Medicine

## 2017-11-28 ENCOUNTER — Encounter: Payer: Self-pay | Admitting: Family Medicine

## 2017-11-28 VITALS — BP 138/82 | HR 69 | Temp 98.3°F | Ht 71.0 in | Wt 198.0 lb

## 2017-11-28 DIAGNOSIS — J3089 Other allergic rhinitis: Secondary | ICD-10-CM | POA: Diagnosis not present

## 2017-11-28 DIAGNOSIS — I10 Essential (primary) hypertension: Secondary | ICD-10-CM

## 2017-11-28 NOTE — Progress Notes (Signed)
Subjective:  Frank Joseph. is a 62 y.o. year old very pleasant male patient who presents for/with See problem oriented charting ROS- no recent headaches. No chest pain or shortness of breath. No blurry vision.    Past Medical History-  Patient Active Problem List   Diagnosis Date Noted  . History of adenomatous polyp of colon 08/15/2017    Priority: Medium  . Essential hypertension 12/09/2009    Priority: Medium  . Hyperlipidemia 03/07/2007    Priority: Medium  . Migraine 12/08/2006    Priority: Medium  . Hyperglycemia 05/21/2016    Priority: Low  . Thrombocytopenia ([place]) 05/21/2016    Priority: Low  . Allergic rhinitis 01/04/2014    Priority: Low  . Anxiety state, unspecified 05/25/2010    Priority: Low    Medications- reviewed and updated Current Outpatient Medications  Medication Sig Dispense Refill  . aspirin 81 MG tablet Take 81 mg by mouth daily.    . bisoprolol-hydrochlorothiazide (ZIAC) 10-6.25 MG tablet TAKE 1 TABLET BY MOUTH DAILY 90 tablet 1  . ezetimibe-simvastatin (VYTORIN) 10-20 MG tablet TAKE 1 TABLET BY MOUTH AT BEDTIME 90 tablet 1  . lisinopril (PRINIVIL,ZESTRIL) 5 MG tablet TAKE 1 TABLET(5 MG) BY MOUTH DAILY 90 tablet 1  . multivitamin (THERAGRAN) per tablet Take 1 tablet by mouth daily.      . SUMAtriptan (IMITREX) 25 MG tablet Take 1 tablet (25 mg total) by mouth every 2 (two) hours as needed. 10 tablet 6  . triamcinolone (NASACORT) 55 MCG/ACT AERO nasal inhaler Place 2 sprays into the nose daily.     No current facility-administered medications for this visit.     Objective: BP 138/82 (BP Location: Left Arm, Cuff Size: Large)   Pulse 69   Temp 98.3 F (36.8 C) (Oral)   Ht 5\' 11"  (1.803 m)   Wt 198 lb (89.8 kg)   SpO2 98%   BMI 27.62 kg/m  Gen: NAD, resting comfortably CV: RRR no murmurs rubs or gallops Lungs: CTAB no crackles, wheeze, rhonchi Abdomen: soft/nontender/nondistended/normal bowel sounds. Ext: no edema Skin: warm,  dry  Assessment/Plan:  Essential hypertension S: controlled on ziac and lisinopril 5mg . Home #s 120s over 80s. Down another lb. At home 192-193  BP Readings from Last 3 Encounters:  11/28/17 138/82  08/23/17 132/86  08/15/17 134/86  A/P: We discussed blood pressure goal of <140/90. Continue current meds:  Really well controlled particularly at home  Allergic rhinitis S:  he came off claritin and his dizziness resolved.allergies havent been worse. He is sticking with nasacort A/P: continue nasacort alone. Has issues with antihistamines- fatigue on zyrtec and dizzy spells on claritin. If absolutely have to- consider allegra in future   Future Appointments  Date Time Provider [place]  05/29/2018  8:15 AM Marin Olp, MD [facility]   Return precautions advised.  [name], MD

## 2017-11-28 NOTE — Patient Instructions (Addendum)
Health Maintenance Due  Topic Date Due  . INFLUENZA VACCINE -please schedule after 12/08/17 11/24/2017   Blood pressure looks great at home. No changes today.   Glad the coming off claritin helped your equilibrium   See you in february

## 2017-11-30 NOTE — Assessment & Plan Note (Signed)
S:  he came off claritin and his dizziness resolved.allergies havent been worse. He is sticking with nasacort A/P: continue nasacort alone. Has issues with antihistamines- fatigue on zyrtec and dizzy spells on claritin. If absolutely have to- consider allegra in future

## 2017-11-30 NOTE — Assessment & Plan Note (Signed)
S: controlled on ziac and lisinopril 5mg . Home #s 120s over 80s. Down another lb. At home 192-193  BP Readings from Last 3 Encounters:  11/28/17 138/82  08/23/17 132/86  08/15/17 134/86  A/P: We discussed blood pressure goal of <140/90. Continue current meds:  Really well controlled particularly at home

## 2018-01-05 ENCOUNTER — Other Ambulatory Visit: Payer: Self-pay

## 2018-01-05 MED ORDER — LISINOPRIL 5 MG PO TABS: ORAL_TABLET | ORAL | 1 refills | Status: DC

## 2018-01-20 ENCOUNTER — Ambulatory Visit: Payer: Self-pay

## 2018-01-20 ENCOUNTER — Other Ambulatory Visit: Payer: Self-pay | Admitting: Family Medicine

## 2018-01-20 NOTE — Telephone Encounter (Signed)
[place] on file and spoke with Ignacia Bayley, Occupational psychologist. Confirmed that they received order for Ziac today by Dr Yong Channel and they will contact the patient when ready.

## 2018-01-20 NOTE — Telephone Encounter (Signed)
Pt called stating he has 2 days of medication left. He states the pharmacy advised they had been faxing to [facility] and no response. Pt has been waiting since Monday to get medication.    Bisoprolol-hydroCHLOROthiazide    [id number] - [place], [place] - [address] [place] [phone] (Phone) [phone] (Fax)

## 2018-02-05 ENCOUNTER — Other Ambulatory Visit: Payer: Self-pay | Admitting: Family Medicine

## 2018-02-24 ENCOUNTER — Encounter: Payer: Self-pay | Admitting: Family Medicine

## 2018-02-24 ENCOUNTER — Ambulatory Visit: Payer: Self-pay

## 2018-02-24 ENCOUNTER — Ambulatory Visit: Payer: BLUE CROSS/BLUE SHIELD | Admitting: Family Medicine

## 2018-02-24 VITALS — BP 102/80 | HR 84 | Temp 98.5°F

## 2018-02-24 DIAGNOSIS — A084 Viral intestinal infection, unspecified: Secondary | ICD-10-CM

## 2018-02-24 DIAGNOSIS — I10 Essential (primary) hypertension: Secondary | ICD-10-CM

## 2018-02-24 MED ORDER — SUMATRIPTAN SUCCINATE 25 MG PO TABS: 25.0000 mg | ORAL_TABLET | ORAL | 6 refills | Status: DC | PRN

## 2018-02-24 MED ORDER — ONDANSETRON 8 MG PO TBDP: 8.0000 mg | ORAL_TABLET | Freq: Three times a day (TID) | ORAL | 0 refills | Status: DC | PRN

## 2018-02-24 NOTE — Progress Notes (Signed)
Subjective:  Frank Joseph. is a 62 y.o. year old very pleasant male patient who presents for/with See problem oriented charting ROS- has had fever, chills, vomiting. Denies obvious diarrhea. No chest pain or shortness of breath   Past Medical History-  Patient Active Problem List   Diagnosis Date Noted  . History of adenomatous polyp of colon 08/15/2017    Priority: Medium  . Essential hypertension 12/09/2009    Priority: Medium  . Hyperlipidemia 03/07/2007    Priority: Medium  . Migraine 12/08/2006    Priority: Medium  . Hyperglycemia 05/21/2016    Priority: Low  . Thrombocytopenia ([place]) 05/21/2016    Priority: Low  . Allergic rhinitis 01/04/2014    Priority: Low  . Anxiety state, unspecified 05/25/2010    Priority: Low    Medications- reviewed and updated Current Outpatient Medications  Medication Sig Dispense Refill  . aspirin 81 MG tablet Take 81 mg by mouth daily.    . bisoprolol-hydrochlorothiazide (ZIAC) 10-6.25 MG tablet TAKE 1 TABLET BY MOUTH DAILY 90 tablet 1  . ezetimibe-simvastatin (VYTORIN) 10-20 MG tablet TAKE 1 TABLET BY MOUTH AT BEDTIME 90 tablet 0  . lisinopril (PRINIVIL,ZESTRIL) 5 MG tablet TAKE 1 TABLET(5 MG) BY MOUTH DAILY 90 tablet 1  . multivitamin (THERAGRAN) per tablet Take 1 tablet by mouth daily.      . SUMAtriptan (IMITREX) 25 MG tablet Take 1 tablet (25 mg total) by mouth every 2 (two) hours as needed. 10 tablet 6  . triamcinolone (NASACORT) 55 MCG/ACT AERO nasal inhaler Place 2 sprays into the nose daily.     Objective: BP 102/80 (BP Location: Left Arm, Patient Position: Sitting, Cuff Size: Large)   Pulse 84   Temp 98.5 F (36.9 C) (Oral)   SpO2 96%  Gen: appears fatigued Mucous membranes are moist. CV: RRR no murmurs rubs or gallops Lungs: CTAB no crackles, wheeze, rhonchi Abdomen: soft/nontender/nondistended/normal bowel sounds.  Ext: no edema Skin: warm, dry  Assessment/Plan:  Viral gastroenteritis S: patient states his  stomach has felt perhaps slightly off for several days. Last night at at [employer] and felt mild indigestion feelings after eating nuggets and fries. He just felt slightly off rest of evening.   Woke up this morning and felt fatigued and like stomahc was upset. At 8 30 AM had large BM but no diarrhea- shortly after that threw up 3x and felt better after that. He had some chills this morning- noted fever up to 101. Temperature came down and has stayed down with 1 ibuprofen today. He has had 3-4 more solid stools today (states also had some larger BMs this week but not this many stools in any day).   He called in and we advise dhim to push fluids. He drank four 20 oz bottles of water as well as two at least 16 oz gingerales. He has not felt much nausea with those. Denies abdominal pain. Seems to be improving as day goes along A/P: 62 year old with fever, chills, vomiting, more frequent BMs- suspect viral gastroenteritis as cause. Return precautions discussed. I am hoping this is just a 24 hour illness but we discussed could last up to 7 days.  - BP running low today so advised him to hold lisinopril until feeling better - wife asks about taking one of his wife's zofran- I advised against this. I refilled a separate rx for him after reviewing prior EKG which showed no prior qt prolongation though was several years ago (2014) From  [facility] "Fantastic job pushing fluids!  I would target 60 to 80 ounces of fluids every 24 hours for the next few days as long as you do not have any more diarrhea or vomiting.  You are contagious until 24 hours fever, vomiting free.  Try to get extra 1 to 2 hours of sleep at night and then nap in the daytime is able.  Suspect this is viral gastroenteritis given the fever you had which is a fancy name for stomach bug.  These can last up to 3 to 7 days but I am hoping yours is much shorter. "  Refilled imitrex as well as he states his old rx had expired  Future Appointments   Date Time Provider [place]  05/29/2018  8:20 AM Marin Olp, MD [facility]   Meds ordered this encounter  Medications  . SUMAtriptan (IMITREX) 25 MG tablet    Sig: Take 1 tablet (25 mg total) by mouth every 2 (two) hours as needed.    Dispense:  10 tablet    Refill:  6  . ondansetron (ZOFRAN-ODT) 8 MG disintegrating tablet    Sig: Take 1 tablet (8 mg total) by mouth every 8 (eight) hours as needed for nausea or vomiting.    Dispense:  20 tablet    Refill:  0    [name], MD

## 2018-02-24 NOTE — Telephone Encounter (Signed)
Pt.'s wife reports pt.started having vomiting and diarrhea this morning.Vomited x 3 and 4-5 loose stools. Has a fever of 101.1.Wife concerned about him getting dehydrated and also taking his medications. Is able to take sips of water. Takes his medications at night. Reviewed home remedies and signs of dehydration. Wife verbalizes understanding. If pt. Shows signs of dehydration, she will take him to the ED. Instructed to call back as needed.  Reason for Disposition . MILD or MODERATE vomiting (e.g., 1 - 5 times / day)  Answer Assessment - Initial Assessment Questions 1. VOMITING SEVERITY: "How many times have you vomited in the past 24 hours?"     - MILD:  1 - 2 times/day    - MODERATE: 3 - 5 times/day, decreased oral intake without significant weight loss or symptoms of dehydration    - SEVERE: 6 or more times/day, vomits everything or nearly everything, with significant weight loss, symptoms of dehydration      Moderate - 3 2. ONSET: "When did the vomiting begin?"      This morning 3. FLUIDS: "What fluids or food have you vomited up today?" "Have you been able to keep any fluids down?"     Keeping water down 4. ABDOMINAL PAIN: "Are your having any abdominal pain?" If yes : "How bad is it and what does it feel like?" (e.g., crampy, dull, intermittent, constant)      A little crampy 5. DIARRHEA: "Is there any diarrhea?" If so, ask: "How many times today?"      4-5 6. CONTACTS: "Is there anyone else in the family with the same symptoms?"      No 7. CAUSE: "What do you think is causing your vomiting?"     Unsure 8. HYDRATION STATUS: "Any signs of dehydration?" (e.g., dry mouth [not only dry lips], too weak to stand) "When did you last urinate?"     Voiding 9. OTHER SYMPTOMS: "Do you have any other symptoms?" (e.g., fever, headache, vertigo, vomiting blood or coffee grounds, recent head injury)     Fever 10. PREGNANCY: "Is there any chance you are pregnant?" "When was your last menstrual  period?"       n/a  Protocols used: [facility]

## 2018-02-24 NOTE — Patient Instructions (Addendum)
Health Maintenance Due  Topic Date Due  . INFLUENZA VACCINE -schedule a nurse visit in 10 to 14 days for flu shot 11/24/2017   Hold lisinopril until you feel better-suspect Sunday or Monday  Fantastic job pushing fluids!  I would target 60 to 80 ounces of fluids every 24 hours for the next few days as long as you do not have any more diarrhea or vomiting.  You are contagious until 24 hours fever, vomiting free.  Try to get extra 1 to 2 hours of sleep at night and then nap in the daytime is able.  Suspect this is viral gastroenteritis given the fever you had which is a fancy name for stomach bug.  These can last up to 3 to 7 days but I am hoping yours is much shorter

## 2018-02-24 NOTE — Telephone Encounter (Addendum)
Called and spoke with patient. Appt scheduled for today at 4:15. Encouraged patient to push fluids until appt.

## 2018-02-25 ENCOUNTER — Emergency Department (HOSPITAL_BASED_OUTPATIENT_CLINIC_OR_DEPARTMENT_OTHER): Payer: BLUE CROSS/BLUE SHIELD

## 2018-02-25 ENCOUNTER — Other Ambulatory Visit: Payer: Self-pay

## 2018-02-25 ENCOUNTER — Encounter (HOSPITAL_BASED_OUTPATIENT_CLINIC_OR_DEPARTMENT_OTHER): Payer: Self-pay

## 2018-02-25 ENCOUNTER — Inpatient Hospital Stay (HOSPITAL_BASED_OUTPATIENT_CLINIC_OR_DEPARTMENT_OTHER)
Admission: EM | Admit: 2018-02-25 | Discharge: 2018-02-28 | DRG: 872 | Disposition: A | Discharge status: Home / Self Care | Payer: BLUE CROSS/BLUE SHIELD | Attending: Internal Medicine | Admitting: Internal Medicine

## 2018-02-25 DIAGNOSIS — G43909 Migraine, unspecified, not intractable, without status migrainosus: Secondary | ICD-10-CM | POA: Diagnosis not present

## 2018-02-25 DIAGNOSIS — A4151 Sepsis due to Escherichia coli [E. coli]: Principal | ICD-10-CM | POA: Diagnosis present

## 2018-02-25 DIAGNOSIS — E876 Hypokalemia: Secondary | ICD-10-CM | POA: Diagnosis present

## 2018-02-25 DIAGNOSIS — N390 Urinary tract infection, site not specified: Secondary | ICD-10-CM | POA: Diagnosis present

## 2018-02-25 DIAGNOSIS — N12 Tubulo-interstitial nephritis, not specified as acute or chronic: Secondary | ICD-10-CM | POA: Diagnosis not present

## 2018-02-25 DIAGNOSIS — K449 Diaphragmatic hernia without obstruction or gangrene: Secondary | ICD-10-CM | POA: Diagnosis present

## 2018-02-25 DIAGNOSIS — D696 Thrombocytopenia, unspecified: Secondary | ICD-10-CM | POA: Diagnosis not present

## 2018-02-25 DIAGNOSIS — E782 Mixed hyperlipidemia: Secondary | ICD-10-CM | POA: Diagnosis not present

## 2018-02-25 DIAGNOSIS — I10 Essential (primary) hypertension: Secondary | ICD-10-CM | POA: Diagnosis not present

## 2018-02-25 DIAGNOSIS — E785 Hyperlipidemia, unspecified: Secondary | ICD-10-CM | POA: Diagnosis not present

## 2018-02-25 DIAGNOSIS — N3000 Acute cystitis without hematuria: Secondary | ICD-10-CM | POA: Diagnosis not present

## 2018-02-25 DIAGNOSIS — A419 Sepsis, unspecified organism: Secondary | ICD-10-CM | POA: Diagnosis present

## 2018-02-25 DIAGNOSIS — R7881 Bacteremia: Secondary | ICD-10-CM | POA: Diagnosis not present

## 2018-02-25 DIAGNOSIS — G43809 Other migraine, not intractable, without status migrainosus: Secondary | ICD-10-CM | POA: Diagnosis not present

## 2018-02-25 DIAGNOSIS — N10 Acute tubulo-interstitial nephritis: Secondary | ICD-10-CM

## 2018-02-25 DIAGNOSIS — Z87898 Personal history of other specified conditions: Secondary | ICD-10-CM

## 2018-02-25 DIAGNOSIS — K760 Fatty (change of) liver, not elsewhere classified: Secondary | ICD-10-CM | POA: Diagnosis not present

## 2018-02-25 HISTORY — DX: Diaphragmatic hernia without obstruction or gangrene: K44.9

## 2018-02-25 LAB — LACTIC ACID, PLASMA
LACTIC ACID, VENOUS: 2.1 mmol/L — AB (ref 0.5–1.9)
LACTIC ACID, VENOUS: 1.7 mmol/L (ref 0.5–1.9)
LACTIC ACID, VENOUS: 1.3 mmol/L (ref 0.5–1.9)
Lactic Acid, Venous: 2.2 mmol/L (ref 0.5–1.9)

## 2018-02-25 LAB — BLOOD CULTURE ID PANEL (REFLEXED)
Acinetobacter baumannii: NOT DETECTED
CANDIDA TROPICALIS: NOT DETECTED
CARBAPENEM RESISTANCE: NOT DETECTED
Candida albicans: NOT DETECTED
Candida glabrata: NOT DETECTED
Candida krusei: NOT DETECTED
Candida parapsilosis: NOT DETECTED
ENTEROCOCCUS SPECIES: NOT DETECTED
Enterobacter cloacae complex: NOT DETECTED
Enterobacteriaceae species: DETECTED — AB
Escherichia coli: DETECTED — AB
Haemophilus influenzae: NOT DETECTED
Klebsiella oxytoca: NOT DETECTED
Klebsiella pneumoniae: NOT DETECTED
LISTERIA MONOCYTOGENES: NOT DETECTED
NEISSERIA MENINGITIDIS: NOT DETECTED
PROTEUS SPECIES: NOT DETECTED
Pseudomonas aeruginosa: NOT DETECTED
SERRATIA MARCESCENS: NOT DETECTED
STAPHYLOCOCCUS AUREUS BCID: NOT DETECTED
STAPHYLOCOCCUS SPECIES: NOT DETECTED
STREPTOCOCCUS PNEUMONIAE: NOT DETECTED
Streptococcus agalactiae: NOT DETECTED
Streptococcus pyogenes: NOT DETECTED
Streptococcus species: NOT DETECTED

## 2018-02-25 LAB — URINALYSIS, ROUTINE W REFLEX MICROSCOPIC
GLUCOSE, UA: 100 mg/dL — AB
Ketones, ur: NEGATIVE mg/dL
Nitrite: POSITIVE — AB
PH: 5 (ref 5.0–8.0)
Protein, ur: 30 mg/dL — AB

## 2018-02-25 LAB — I-STAT CG4 LACTIC ACID, ED
LACTIC ACID, VENOUS: 3.1 mmol/L — AB (ref 0.5–1.9)
LACTIC ACID, VENOUS: 1.56 mmol/L (ref 0.5–1.9)

## 2018-02-25 LAB — CBC WITH DIFFERENTIAL/PLATELET
Abs Immature Granulocytes: 0.01 10*3/uL (ref 0.00–0.07)
BASOS ABS: 0 10*3/uL (ref 0.0–0.1)
Basophils Relative: 0 %
EOS ABS: 0 10*3/uL (ref 0.0–0.5)
Eosinophils Relative: 0 %
HCT: 42.3 % (ref 39.0–52.0)
HEMOGLOBIN: 14.3 g/dL (ref 13.0–17.0)
Immature Granulocytes: 0 %
LYMPHS ABS: 0.1 10*3/uL — AB (ref 0.7–4.0)
Lymphocytes Relative: 3 %
MCH: 31.8 pg (ref 26.0–34.0)
MCHC: 33.8 g/dL (ref 30.0–36.0)
MCV: 94.2 fL (ref 80.0–100.0)
Monocytes Absolute: 0 10*3/uL — ABNORMAL LOW (ref 0.1–1.0)
Monocytes Relative: 1 %
NEUTROS ABS: 3.3 10*3/uL (ref 1.7–7.7)
NEUTROS PCT: 96 %
NRBC: 0 % (ref 0.0–0.2)
Platelets: 71 10*3/uL — ABNORMAL LOW (ref 150–400)
RBC: 4.49 MIL/uL (ref 4.22–5.81)
RDW: 12.6 % (ref 11.5–15.5)
WBC: 3.4 10*3/uL — ABNORMAL LOW (ref 4.0–10.5)

## 2018-02-25 LAB — COMPREHENSIVE METABOLIC PANEL
ALT: 33 U/L (ref 0–44)
AST: 35 U/L (ref 15–41)
Albumin: 4 g/dL (ref 3.5–5.0)
Alkaline Phosphatase: 60 U/L (ref 38–126)
Anion gap: 14 (ref 5–15)
BUN: 17 mg/dL (ref 8–23)
CHLORIDE: 98 mmol/L (ref 98–111)
CO2: 20 mmol/L — AB (ref 22–32)
Calcium: 8.9 mg/dL (ref 8.9–10.3)
Creatinine, Ser: 1.14 mg/dL (ref 0.61–1.24)
GFR calc Af Amer: >60 mL/min (ref >60)
GFR calc non Af Amer: >60 mL/min (ref >60)
GLUCOSE: 128 mg/dL — AB (ref 70–99)
POTASSIUM: 3.2 mmol/L — AB (ref 3.5–5.1)
SODIUM: 132 mmol/L — AB (ref 135–145)
Total Bilirubin: 2.1 mg/dL — ABNORMAL HIGH (ref 0.3–1.2)
Total Protein: 6.8 g/dL (ref 6.5–8.1)

## 2018-02-25 LAB — URINALYSIS, MICROSCOPIC (REFLEX): WBC, UA: >50 WBC/hpf (ref 0–5)

## 2018-02-25 LAB — LIPASE, BLOOD: LIPASE: 23 U/L (ref 11–51)

## 2018-02-25 LAB — CBG MONITORING, ED: GLUCOSE-CAPILLARY: 129 mg/dL — AB (ref 70–99)

## 2018-02-25 LAB — PROCALCITONIN: PROCALCITONIN: 36.14 ng/mL

## 2018-02-25 MED ORDER — SODIUM CHLORIDE 0.9 % IV BOLUS
1000.0000 mL | Freq: Once | INTRAVENOUS | Status: AC
  Administered 2018-02-25: 1000 mL via INTRAVENOUS

## 2018-02-25 MED ORDER — ASPIRIN EC 81 MG PO TBEC
81.0000 mg | DELAYED_RELEASE_TABLET | Freq: Every day | ORAL | Status: DC
  Administered 2018-02-25 – 2018-02-28 (×4): 81 mg via ORAL
  Filled 2018-02-25 (×4): qty 1

## 2018-02-25 MED ORDER — SUMATRIPTAN SUCCINATE 25 MG PO TABS
25.0000 mg | ORAL_TABLET | ORAL | Status: DC | PRN
  Filled 2018-02-25: qty 1

## 2018-02-25 MED ORDER — ACETAMINOPHEN 325 MG PO TABS
650.0000 mg | ORAL_TABLET | Freq: Four times a day (QID) | ORAL | Status: DC | PRN
  Administered 2018-02-25 – 2018-02-26 (×2): 650 mg via ORAL
  Filled 2018-02-25 (×2): qty 2

## 2018-02-25 MED ORDER — HYDRALAZINE HCL 20 MG/ML IJ SOLN: 5.0000 mg | INTRAMUSCULAR | Status: DC | PRN

## 2018-02-25 MED ORDER — ADULT MULTIVITAMIN W/MINERALS CH
1.0000 | ORAL_TABLET | Freq: Every day | ORAL | Status: DC
  Administered 2018-02-25 – 2018-02-28 (×4): 1 via ORAL
  Filled 2018-02-25 (×4): qty 1

## 2018-02-25 MED ORDER — SODIUM CHLORIDE 0.9 % IV SOLN
INTRAVENOUS | Status: DC
  Administered 2018-02-25 (×2): via INTRAVENOUS

## 2018-02-25 MED ORDER — ONDANSETRON HCL 4 MG/2ML IJ SOLN
4.0000 mg | Freq: Three times a day (TID) | INTRAMUSCULAR | Status: AC | PRN
  Administered 2018-02-25: 4 mg via INTRAVENOUS
  Filled 2018-02-25: qty 2

## 2018-02-25 MED ORDER — ACETAMINOPHEN 325 MG PO TABS
650.0000 mg | ORAL_TABLET | Freq: Once | ORAL | Status: AC
  Administered 2018-02-25: 650 mg via ORAL
  Filled 2018-02-25: qty 2

## 2018-02-25 MED ORDER — POTASSIUM CHLORIDE CRYS ER 20 MEQ PO TBCR
40.0000 meq | EXTENDED_RELEASE_TABLET | Freq: Once | ORAL | Status: AC
  Administered 2018-02-25: 40 meq via ORAL
  Filled 2018-02-25: qty 2

## 2018-02-25 MED ORDER — ACETAMINOPHEN 650 MG RE SUPP: 650.0000 mg | Freq: Four times a day (QID) | RECTAL | Status: DC | PRN

## 2018-02-25 MED ORDER — SODIUM CHLORIDE 0.9 % IV SOLN: 1.0000 g | INTRAVENOUS | Status: DC

## 2018-02-25 MED ORDER — SODIUM CHLORIDE 0.9 % IV BOLUS
500.0000 mL | Freq: Once | INTRAVENOUS | Status: AC
  Administered 2018-02-25: 500 mL via INTRAVENOUS

## 2018-02-25 MED ORDER — ZOLPIDEM TARTRATE 5 MG PO TABS
5.0000 mg | ORAL_TABLET | Freq: Every evening | ORAL | Status: DC | PRN
  Administered 2018-02-25: 5 mg via ORAL
  Filled 2018-02-25: qty 1

## 2018-02-25 MED ORDER — SODIUM CHLORIDE 0.9 % IV SOLN
INTRAVENOUS | Status: AC
  Administered 2018-02-25: 23:00:00 via INTRAVENOUS

## 2018-02-25 MED ORDER — SODIUM CHLORIDE 0.9 % IV SOLN
1.0000 g | INTRAVENOUS | Status: AC
  Administered 2018-02-25: 1 g via INTRAVENOUS
  Filled 2018-02-25: qty 1

## 2018-02-25 MED ORDER — SODIUM CHLORIDE 0.9 % IV SOLN
2.0000 g | INTRAVENOUS | Status: DC
  Administered 2018-02-26 – 2018-02-28 (×3): 2 g via INTRAVENOUS
  Filled 2018-02-25 (×3): qty 2

## 2018-02-25 MED ORDER — SODIUM CHLORIDE 0.9 % IV SOLN
1.0000 g | Freq: Once | INTRAVENOUS | Status: AC
  Administered 2018-02-25: 1 g via INTRAVENOUS
  Filled 2018-02-25: qty 10

## 2018-02-25 MED ORDER — ONDANSETRON HCL 4 MG/2ML IJ SOLN
4.0000 mg | Freq: Once | INTRAMUSCULAR | Status: AC | PRN
  Administered 2018-02-25: 4 mg via INTRAVENOUS
  Filled 2018-02-25: qty 2

## 2018-02-25 MED ORDER — FLUTICASONE PROPIONATE 50 MCG/ACT NA SUSP
2.0000 | Freq: Every day | NASAL | Status: DC
  Administered 2018-02-25 – 2018-02-28 (×4): 2 via NASAL
  Filled 2018-02-25: qty 16

## 2018-02-25 MED ORDER — SENNOSIDES-DOCUSATE SODIUM 8.6-50 MG PO TABS
1.0000 | ORAL_TABLET | Freq: Every evening | ORAL | Status: DC | PRN
  Administered 2018-02-25: 1 via ORAL
  Filled 2018-02-25: qty 1

## 2018-02-25 MED ORDER — EZETIMIBE-SIMVASTATIN 10-20 MG PO TABS
1.0000 | ORAL_TABLET | Freq: Every day | ORAL | Status: DC
  Administered 2018-02-25 – 2018-02-27 (×3): 1 via ORAL
  Filled 2018-02-25 (×3): qty 1

## 2018-02-25 NOTE — ED Triage Notes (Signed)
Pt reports vomiting yesterday morning. Pt was seen by PCP. Pt having increased fever and continued emesis. Pt was given a Rx for ondansetron, but has not had a chance to fill it.

## 2018-02-25 NOTE — ED Notes (Signed)
[facility] notified (Taryn) - patient ready for transport 

## 2018-02-25 NOTE — H&P (Signed)
History and Physical    Frank Joseph. QQI:[id number] DOB: 1955/09/09 DOA: 02/25/2018  Referring MD/NP/PA:   PCP: Marin Olp, MD   Patient coming from:  The patient is coming from home.  At baseline, pt is independent for most of ADL.        Chief Complaint: Nausea, vomiting, fever and chills   HPI: Frank Joseph. is a 62 y.o. male with medical history significant of hypertension, hyperlipidemia, migraine headaches, thrombocytopenia, who presents with nausea, vomiting, fever and chills.    Patient states that he started having nausea, vomiting, fever and chills since yesterday morning. He has non-biliary, nonbloody vomitus.  He had fever for 103 at home. He saw his PCP in the office and was thought to have viral gastroenteritis.  He seemed to get better and was able to keep fluids down throughout most of the day, but then had multiple episodes of vomiting again, about 9 episodes since about 6 PM.  Patient does not have chest pain, cough, shortness breath.  No diarrhea or abdominal pain.  He had mild lower abdominal pain earlier, which has completely resolved.  Patient denies typical symptoms of UTI, such as dysuria, burning on urination, increased urinary frequency and hematuria.  No penile discharge.  No unilateral weakness.  ED Course: patient was found to have positive urinalysis (cloudy appearance, moderate amount of leukocyte, many bacteria, WBC>50), WBC 3.4, potassium 3.2, creatinine 1.14, BUN 17, temperature 100.7, tachycardia, tachypnea, oxygen saturation 96% on room air.  CT per renal stone protocol did not show kidney stone or hydronephrosis. Patient is placed on MedSurg bed for observation.   Review of Systems:   General: has fevers, chills, no body weight gain, has poor appetite, has fatigue HEENT: no blurry vision, hearing changes or sore throat Respiratory: no dyspnea, coughing, wheezing CV: no chest pain, no palpitations GI: has nausea, vomiting, no   abdominal pain, diarrhea, constipation GU: no dysuria, burning on urination, increased urinary frequency, hematuria  Ext: no leg edema Neuro: no unilateral weakness, numbness, or tingling, no vision change or hearing loss Skin: no rash, no skin tear. MSK: No muscle spasm, no deformity, no limitation of range of movement in spin Heme: No easy bruising.  Travel history: No recent long distant travel.  Allergy: No Known Allergies  Past Medical History:  Diagnosis Date  . Allergy   . Headache(784.0)   . Hiatal hernia   . Hyperlipidemia   . Hypertension   . Sinusitis     Past Surgical History:  Procedure Laterality Date  . COLONOSCOPY     int hems only- with stark   . colonscopy    . WISDOM TOOTH EXTRACTION      Social History:  reports that he has never smoked. He has never used smokeless tobacco. He reports that he drinks alcohol. He reports that he does not use drugs.  Family History:  Family History  Problem Relation Age of Onset  . Hypertension Mother   . Glaucoma Father   . Colon cancer Neg Hx   . Colon polyps Neg Hx   . Esophageal cancer Neg Hx   . Rectal cancer Neg Hx   . Stomach cancer Neg Hx      Prior to Admission medications   Medication Sig Start Date End Date Taking? Authorizing Provider  aspirin 81 MG tablet Take 81 mg by mouth daily.    [provider]  bisoprolol-hydrochlorothiazide (ZIAC) 10-6.25 MG tablet TAKE 1 TABLET BY MOUTH DAILY  01/20/18   Marin Olp, MD  ezetimibe-simvastatin (VYTORIN) 10-20 MG tablet TAKE 1 TABLET BY MOUTH AT BEDTIME 02/06/18   Marin Olp, MD  lisinopril (PRINIVIL,ZESTRIL) 5 MG tablet TAKE 1 TABLET(5 MG) BY MOUTH DAILY 01/05/18   Marin Olp, MD  multivitamin [facility]) per tablet Take 1 tablet by mouth daily.      [provider]  ondansetron (ZOFRAN-ODT) 8 MG disintegrating tablet Take 1 tablet (8 mg total) by mouth every 8 (eight) hours as needed for nausea or vomiting. 02/24/18   Marin Olp, MD  SUMAtriptan (IMITREX) 25 MG tablet Take 1 tablet (25 mg total) by mouth every 2 (two) hours as needed. 02/24/18   Marin Olp, MD  triamcinolone (NASACORT) 55 MCG/ACT AERO nasal inhaler Place 2 sprays into the nose daily.    [provider]    Physical Exam: Vitals:   02/25/18 0325 02/25/18 0336 02/25/18 0338 02/25/18 0510  BP:  110/70  106/77  Pulse: 87 88  78  Resp:    16  Temp:   99.1 F (37.3 C)   TempSrc:   Oral   SpO2: 96% 95%  98%  Weight:    90.2 kg  Height:    6' (1.829 m)   General: Not in acute distress HEENT:       Eyes: PERRL, EOMI, no scleral icterus.       ENT: No discharge from the ears and nose, no pharynx injection, no tonsillar enlargement.        Neck: No JVD, no bruit, no mass felt. Heme: No neck lymph node enlargement. Cardiac: S1/S2, RRR, No murmurs, No gallops or rubs. Respiratory:  No rales, wheezing, rhonchi or rubs. GI: Soft, nondistended, nontender, no rebound pain, no organomegaly, BS present. GU: No hematuria. No CVA tenderness. Ext: No pitting leg edema bilaterally. 2+DP/PT pulse bilaterally. Musculoskeletal: No joint deformities, No joint redness or warmth, no limitation of ROM in spin. Skin: No rashes.  Neuro: Alert, oriented X3, cranial nerves II-XII grossly intact, moves all extremities normally. Psych: Patient is not psychotic, no suicidal or hemocidal ideation.  Labs on Admission: I have personally reviewed following labs and imaging studies  CBC: Recent Labs  Lab 02/25/18 0125  WBC 3.4*  NEUTROABS 3.3  HGB 14.3  HCT 42.3  MCV 94.2  PLT 71*   Basic Metabolic Panel: Recent Labs  Lab 02/25/18 0125  NA 132*  K 3.2*  CL 98  CO2 20*  GLUCOSE 128*  BUN 17  CREATININE 1.14  CALCIUM 8.9   GFR: Estimated Creatinine Clearance: 73.7 mL/min (by C-G formula based on SCr of 1.14 mg/dL). Liver Function Tests: Recent Labs  Lab 02/25/18 0125  AST 35  ALT 33  ALKPHOS 60  BILITOT 2.1*  PROT 6.8    ALBUMIN 4.0   Recent Labs  Lab 02/25/18 0125  LIPASE 23   No results for input(s): AMMONIA in the last 168 hours. Coagulation Profile: No results for input(s): INR, PROTIME in the last 168 hours. Cardiac Enzymes: No results for input(s): CKTOTAL, CKMB, CKMBINDEX, TROPONINI in the last 168 hours. BNP (last 3 results) No results for input(s): PROBNP in the last 8760 hours. HbA1C: No results for input(s): HGBA1C in the last 72 hours. CBG: Recent Labs  Lab 02/25/18 0050  GLUCAP 129*   Lipid Profile: No results for input(s): CHOL, HDL, LDLCALC, TRIG, CHOLHDL, LDLDIRECT in the last 72 hours. Thyroid Function Tests: No results for input(s): TSH, T4TOTAL, FREET4, T3FREE, THYROIDAB  in the last 72 hours. Anemia Panel: No results for input(s): VITAMINB12, FOLATE, FERRITIN, TIBC, IRON, RETICCTPCT in the last 72 hours. Urine analysis:    Component Value Date/Time   COLORURINE YELLOW 02/25/2018 0142   APPEARANCEUR CLOUDY (A) 02/25/2018 0142   LABSPEC >1.030 (H) 02/25/2018 0142   PHURINE 5.0 02/25/2018 0142   GLUCOSEU 100 (A) 02/25/2018 0142   HGBUR MODERATE (A) 02/25/2018 0142   HGBUR negative 02/05/2010 0758   BILIRUBINUR SMALL (A) 02/25/2018 0142   BILIRUBINUR n 05/14/2016 1033   KETONESUR NEGATIVE 02/25/2018 0142   PROTEINUR 30 (A) 02/25/2018 0142   UROBILINOGEN 1.0 05/14/2016 1033   UROBILINOGEN 0.2 02/05/2010 0758   NITRITE POSITIVE (A) 02/25/2018 0142   LEUKOCYTESUR MODERATE (A) 02/25/2018 0142   Sepsis Labs: @LABRCNTIP (procalcitonin:4,lacticidven:4) )No results found for this or any previous visit (from the past 240 hour(s)).   Radiological Exams on Admission: Ct Renal Stone Study  Result Date: 02/25/2018 CLINICAL DATA:  62 year old male with vomiting and flank pain. EXAM: CT ABDOMEN AND PELVIS WITHOUT CONTRAST TECHNIQUE: Multidetector CT imaging of the abdomen and pelvis was performed following the standard protocol without IV contrast. COMPARISON:  None. FINDINGS:  Evaluation of this exam is limited in the absence of intravenous contrast. Lower chest: The visualized lung bases are clear. No intra-abdominal free air or free fluid. Hepatobiliary: Diffuse fatty infiltration of the liver. No intrahepatic biliary ductal dilatation. The gallbladder is unremarkable. Pancreas: Unremarkable. No pancreatic ductal dilatation or surrounding inflammatory changes. Spleen: Normal in size without focal abnormality. Adrenals/Urinary Tract: There is a 12 mm indeterminate right adrenal nodule. The left adrenal gland is unremarkable. There is no hydronephrosis or nephrolithiasis on either side. The visualized ureters and urinary bladder appear unremarkable. Stomach/Bowel: There is no bowel obstruction or active inflammation. There is a focal area of apparent thickening of the midportion of the transverse colon, likely related to underdistention. A mass is less likely but difficult to exclude in the absence of oral contrast. The appendix is normal. Vascular/Lymphatic: Mild aortoiliac atherosclerotic disease. No portal venous gas. There is no adenopathy. Reproductive: The prostate and seminal vesicles are grossly unremarkable. No pelvic mass. Other: None Musculoskeletal: No acute or significant osseous findings. IMPRESSION: 1. No acute intra-abdominal or pelvic pathology. No hydronephrosis or nephrolithiasis. 2. Fatty liver. 3. No bowel obstruction or active inflammation. Normal appendix. 4. Focal area of underdistention in the mid transverse colon versus less likely a mass. Correlation with colonoscopy findings recommended. Electronically Signed   By: Anner Crete M.D.   On: 02/25/2018 03:06     EKG:  Not done in ED  Assessment/Plan Principal Problem:   UTI (urinary tract infection) Active Problems:   Hyperlipidemia   Essential hypertension   Migraine   Thrombocytopenia (HCC)   Sepsis (HCC)   Hypokalemia   Sepsis due to UTI (urinary tract infection): Patient admitted critical  for sepsis with WBC 3.4, fever, tachycardia, tachypnea.  Lactic acid elevated at 3.10.  Currently hemodynamically stable.  CT scan did not show kidney stone or hydronephrosis.  No CVA tenderness, less likely to have pyelonephritis.  -  Place on med-surg bed for obs -  Ceftriaxone by IV - Follow up results of urine and blood cx and amend antibiotic regimen if needed per sensitivity results - prn Zofran for nausea - will get Procalcitonin and trend lactic acid levels per sepsis protocol. - IVF: 2.5 L of NS bolus in ED, followed by 125 cc/h   Hyperlipidemia: -Vytorin  Essential hypertension: Blood pressure normal now. -  Hold home blood pressure medications since patient is at risk of developing hypotension due to sepsis -IV hydralazine PRN  Thrombocytopenia ([place]): This is a chronic issue, unclear etiology.  Platelets 71, which was 131 on 08/15/2017.  Decreased platelets number is likely due to ongoing infection.  No bleeding tendency.  Mental status normal. - Follow-up CBC  Hypokalemia: K=3.2 on admission. - Repleted - Check Mg level  Migraine headache: No headache now. -As needed sumatriptan    DVT ppx:  SQ Lovenox Code Status: Full code Family Communication:   Yes, patient's  Wife at bed side Disposition Plan:  Anticipate discharge back to previous home environment Consults called:  none Admission status: medical floor/obs    Date of Service 02/25/2018    Ivor Costa [facility] Pager [phone]  If 7PM-7AM, please contact night-coverage [email or web address] Password [facility] 02/25/2018, 5:43 AM

## 2018-02-25 NOTE — ED Notes (Signed)
ED Provider at bedside. 

## 2018-02-25 NOTE — Progress Notes (Signed)
PHARMACY - PHYSICIAN COMMUNICATION CRITICAL VALUE ALERT - BLOOD CULTURE IDENTIFICATION (BCID)  Frank Joseph. is an 62 y.o. male who presented to [facility] on 02/25/2018 with a chief complaint of N/V, fever, chills.   Assessment:  Diagnosed with sepsis due to UTI on admission   Received call from micro lab: 4/4 bottles GNR on gram stain. BCID detected E.coli  Name of physician (or Provider) Contacted: Dr. Wyline Copas  Current antibiotics: Ceftriaxone 1 g IV daily  Changes to prescribed antibiotics recommended: Per discussion with MD - will increase to ceftriaxone 2 g IV daily.  No results found for this or any previous visit.  Frank Joseph, PharmD Clinical Pharmacist 02/25/2018  2:44 PM

## 2018-02-25 NOTE — Care Management (Signed)
This is a no charge note   Transfer from [facility] per Dr. Wyvonnia Dusky  62 year old man with past medical history of hypertension, hyperlipidemia, migraine headaches, thrombocytopenia, who presents with nausea, vomiting, bilateral lower back pain, fever and chills.  Found to have positive urinalysis for UTI.  CT per renal stone protocol did not show kidney stone or hydronephrosis.  Patient was found to have WBC 3.4, potassium 3.2 which is repleted with 40 mEq of potassium chloride, creatinine 1.14, BUN 17, temperature 100.7, tachycardia, tachypnea, oxygen saturation 96% on room air.  Patient is placed on MedSurg bed for observation.  IV Rocephin was started.  Please call manager of [facility] at [phone] when pt arrives to floor   Ivor Costa, MD  [facility] Pager [phone]  If 7PM-7AM, please contact night-coverage [email or web address] Password [facility] 02/25/2018, 4:26 AM

## 2018-02-25 NOTE — Progress Notes (Signed)
CRITICAL VALUE ALERT  Critical Value: Lactic Acid 2.1  Date & Time Notied:  02/25/18 at 0722  Provider Notified: Hospitalist  Orders Received/Actions taken: Pending text. Vanita Ingles Rn day shift nurse aware of the above

## 2018-02-25 NOTE — ED Provider Notes (Signed)
[facility] EMERGENCY DEPARTMENT Provider Note   CSN: [id number] Arrival date & time: 02/25/18  0042     History   Chief Complaint Chief Complaint  Patient presents with  . Emesis    HPI Frank Cauthorn. is a 62 y.o. male.  Patient with history of hypertension hyperlipidemia and hiatal hernia presenting with fever and vomiting since yesterday morning.  States he woke up around 830 yesterday morning with queasiness in his stomach and multiple episodes of vomiting.  Did have fever at home up to 102.  Saw his PCP in the office and was thought to have viral gastroenteritis.  He seemed to get better and was able to keep fluids down throughout most of the day but then had multiple episodes of vomiting again about 9 episodes since about 6 PM.  He had about 3 episodes of vomiting this morning.  Has had soft stools but no diarrhea.  Denies any abdominal pain.  No sick contacts, recent travel or antibiotic use.  He took naproxen today but no Tylenol.  He was given a prescription for ondansetron but has not filled it yet.  No previous abdominal surgeries.  No chest pain or shortness of breath.  Arrives with fever to 103.  Complains of pain across his low back on both sides.  No urinary symptoms.  The history is provided by the patient and the spouse.  Emesis   Associated symptoms include abdominal pain and a fever. Pertinent negatives include no arthralgias, no cough, no diarrhea and no headaches.    Past Medical History:  Diagnosis Date  . Allergy   . Headache(784.0)   . Hiatal hernia   . Hyperlipidemia   . Hypertension   . Sinusitis     Patient Active Problem List   Diagnosis Date Noted  . History of adenomatous polyp of colon 08/15/2017  . Hyperglycemia 05/21/2016  . Thrombocytopenia ([place]) 05/21/2016  . Allergic rhinitis 01/04/2014  . Anxiety state, unspecified 05/25/2010  . Essential hypertension 12/09/2009  . Hyperlipidemia 03/07/2007  . Migraine 12/08/2006     Past Surgical History:  Procedure Laterality Date  . COLONOSCOPY     int hems only- with stark   . colonscopy    . WISDOM TOOTH EXTRACTION          Home Medications    Prior to Admission medications   Medication Sig Start Date End Date Taking? Authorizing Provider  aspirin 81 MG tablet Take 81 mg by mouth daily.    [provider]  bisoprolol-hydrochlorothiazide [facility]) 10-6.25 MG tablet TAKE 1 TABLET BY MOUTH DAILY 01/20/18   Marin Olp, MD  ezetimibe-simvastatin (VYTORIN) 10-20 MG tablet TAKE 1 TABLET BY MOUTH AT BEDTIME 02/06/18   Marin Olp, MD  lisinopril (PRINIVIL,ZESTRIL) 5 MG tablet TAKE 1 TABLET(5 MG) BY MOUTH DAILY 01/05/18   Marin Olp, MD  multivitamin [facility]) per tablet Take 1 tablet by mouth daily.      [provider]  ondansetron (ZOFRAN-ODT) 8 MG disintegrating tablet Take 1 tablet (8 mg total) by mouth every 8 (eight) hours as needed for nausea or vomiting. 02/24/18   Marin Olp, MD  SUMAtriptan (IMITREX) 25 MG tablet Take 1 tablet (25 mg total) by mouth every 2 (two) hours as needed. 02/24/18   Marin Olp, MD  triamcinolone (NASACORT) 55 MCG/ACT AERO nasal inhaler Place 2 sprays into the nose daily.    [provider]    Family History Family History  Problem  Relation Age of Onset  . Hypertension Mother   . Glaucoma Father   . Colon cancer Neg Hx   . Colon polyps Neg Hx   . Esophageal cancer Neg Hx   . Rectal cancer Neg Hx   . Stomach cancer Neg Hx     Social History Social History   Tobacco Use  . Smoking status: Never Smoker  . Smokeless tobacco: Never Used  Substance Use Topics  . Alcohol use: Yes    Comment: 2-24  . Drug use: No     Allergies   Patient has no known allergies.   Review of Systems Review of Systems  Constitutional: Positive for activity change, appetite change and fever.  HENT: Negative for congestion and rhinorrhea.   Eyes: Negative for visual  disturbance.  Respiratory: Negative for cough, chest tightness and shortness of breath.   Gastrointestinal: Positive for abdominal pain, nausea and vomiting. Negative for diarrhea.  Genitourinary: Negative for dysuria, hematuria and testicular pain.  Musculoskeletal: Negative for arthralgias, back pain, joint swelling and neck pain.  Neurological: Positive for weakness. Negative for dizziness and headaches.   all other systems are negative except as noted in the HPI and PMH.     Physical Exam Updated Vital Signs BP 116/76 (BP Location: Left Arm)   Pulse (!) 108   Temp (!) 103.1 F (39.5 C) (Oral)   Resp 20   Ht 6' (1.829 m)   Wt 89 kg   SpO2 97%   BMI 26.61 kg/m   Physical Exam  Constitutional: He is oriented to person, place, and time. He appears well-developed and well-nourished. No distress.  Appears ill  HENT:  Head: Normocephalic and atraumatic.  Mouth/Throat: Oropharynx is clear and moist. No oropharyngeal exudate.  Dry mucus membranes  Eyes: Pupils are equal, round, and reactive to light. Conjunctivae and EOM are normal.  Neck: Normal range of motion. Neck supple.  No meningismus.  Cardiovascular: Normal rate, regular rhythm, normal heart sounds and intact distal pulses.  No murmur heard. Pulmonary/Chest: Effort normal and breath sounds normal. No respiratory distress.  Abdominal: Soft. There is tenderness. There is no rebound and no guarding.  Soft, mild diffuse tenderness  Musculoskeletal: Normal range of motion. He exhibits tenderness. He exhibits no edema.  Paraspinal lumbar tenderness  Neurological: He is alert and oriented to person, place, and time. No cranial nerve deficit. He exhibits normal muscle tone. Coordination normal.  No ataxia on finger to nose bilaterally. No pronator drift. 5/5 strength throughout. CN 2-12 intact.Equal grip strength. Sensation intact.   Skin: Skin is warm.  Psychiatric: He has a normal mood and affect. His behavior is normal.   Nursing note and vitals reviewed.    ED Treatments / Results  Labs (all labs ordered are listed, but only abnormal results are displayed) Labs Reviewed  COMPREHENSIVE METABOLIC PANEL - Abnormal; Notable for the following components:      Result Value   Sodium 132 (*)    Potassium 3.2 (*)    CO2 20 (*)    Glucose, Bld 128 (*)    Total Bilirubin 2.1 (*)    All other components within normal limits  URINALYSIS, ROUTINE W REFLEX MICROSCOPIC - Abnormal; Notable for the following components:   APPearance CLOUDY (*)    Specific Gravity, Urine >1.030 (*)    Glucose, UA 100 (*)    Hgb urine dipstick MODERATE (*)    Bilirubin Urine SMALL (*)    Protein, ur 30 (*)  Nitrite POSITIVE (*)    Leukocytes, UA MODERATE (*)    All other components within normal limits  URINALYSIS, MICROSCOPIC (REFLEX) - Abnormal; Notable for the following components:   Bacteria, UA MANY (*)    All other components within normal limits  I-STAT CG4 LACTIC ACID, ED - Abnormal; Notable for the following components:   Lactic Acid, Venous 3.10 (*)    All other components within normal limits  CBG MONITORING, ED - Abnormal; Notable for the following components:   Glucose-Capillary 129 (*)    All other components within normal limits  CULTURE, BLOOD (ROUTINE X 2)  CULTURE, BLOOD (ROUTINE X 2)  URINE CULTURE  LIPASE, BLOOD  CBC WITH DIFFERENTIAL/PLATELET  I-STAT CG4 LACTIC ACID, ED    EKG None  Radiology Ct Renal Stone Study  Result Date: 02/25/2018 CLINICAL DATA:  62 year old male with vomiting and flank pain. EXAM: CT ABDOMEN AND PELVIS WITHOUT CONTRAST TECHNIQUE: Multidetector CT imaging of the abdomen and pelvis was performed following the standard protocol without IV contrast. COMPARISON:  None. FINDINGS: Evaluation of this exam is limited in the absence of intravenous contrast. Lower chest: The visualized lung bases are clear. No intra-abdominal free air or free fluid. Hepatobiliary: Diffuse fatty  infiltration of the liver. No intrahepatic biliary ductal dilatation. The gallbladder is unremarkable. Pancreas: Unremarkable. No pancreatic ductal dilatation or surrounding inflammatory changes. Spleen: Normal in size without focal abnormality. Adrenals/Urinary Tract: There is a 12 mm indeterminate right adrenal nodule. The left adrenal gland is unremarkable. There is no hydronephrosis or nephrolithiasis on either side. The visualized ureters and urinary bladder appear unremarkable. Stomach/Bowel: There is no bowel obstruction or active inflammation. There is a focal area of apparent thickening of the midportion of the transverse colon, likely related to underdistention. A mass is less likely but difficult to exclude in the absence of oral contrast. The appendix is normal. Vascular/Lymphatic: Mild aortoiliac atherosclerotic disease. No portal venous gas. There is no adenopathy. Reproductive: The prostate and seminal vesicles are grossly unremarkable. No pelvic mass. Other: None Musculoskeletal: No acute or significant osseous findings. IMPRESSION: 1. No acute intra-abdominal or pelvic pathology. No hydronephrosis or nephrolithiasis. 2. Fatty liver. 3. No bowel obstruction or active inflammation. Normal appendix. 4. Focal area of underdistention in the mid transverse colon versus less likely a mass. Correlation with colonoscopy findings recommended. Electronically Signed   By: Anner Crete M.D.   On: 02/25/2018 03:06    Procedures Procedures (including critical care time)  Medications Ordered in ED Medications  ondansetron (ZOFRAN) injection 4 mg (has no administration in time range)  acetaminophen (TYLENOL) tablet 650 mg (has no administration in time range)  sodium chloride 0.9 % bolus 1,000 mL (has no administration in time range)     Initial Impression / Assessment and Plan / ED Course  I have reviewed the triage vital signs and the nursing notes.  Pertinent labs & imaging results that were  available during my care of the patient were reviewed by me and considered in my medical decision making (see chart for details).    Patient presents with fever and vomiting for 1 day.  Abdomen is soft without peritoneal signs.  Patient given IV fluids.  Also received blood work including lactate and blood cultures and urinalysis.  Lactate elevated 3.1.  Patient with infected appearing UA and was started on broad-spectrum antibiotics.  CT scan will be obtained to rule out for infected stone  Labs with leukopenia and thrombocytopenia. CT scan shows no hydronephrosis or  ureteral stone.  Patient with improvement in his heart rate and fever.  Blood pressure and mental status remained stable.  Suspect sepsis in setting of UTI rather than gastroenteritis.  Patient continues to have symptoms but no vomiting.  He received 2 L of IV fluids as well as IV antibiotics.  He is agreeable to observation admission as he is still feeling poorly.  Discussed with Dr. Blaine Hamper.  CRITICAL CARE Performed by: Ezequiel Essex Total critical care time: 35 minutes Critical care time was exclusive of separately billable procedures and treating other patients. Critical care was necessary to treat or prevent imminent or life-threatening deterioration. Critical care was time spent personally by me on the following activities: development of treatment plan with patient and/or surrogate as well as nursing, discussions with consultants, evaluation of patient's response to treatment, examination of patient, obtaining history from patient or surrogate, ordering and performing treatments and interventions, ordering and review of laboratory studies, ordering and review of radiographic studies, pulse oximetry and re-evaluation of patient's condition.  Final Clinical Impressions(s) / ED Diagnoses   Final diagnoses:  Sepsis without acute organ dysfunction, due to unspecified organism [facility])  Acute cystitis without hematuria    ED  Discharge Orders    None       Taisa Deloria, Annie Main, MD 02/25/18 [phone]

## 2018-02-25 NOTE — ED Notes (Signed)
Patient transported to CT 

## 2018-02-25 NOTE — Progress Notes (Signed)
PROGRESS NOTE    Frank Joseph.  FVC:[id number] DOB: [birth year] DOA: 02/25/2018 PCP: Marin Olp, MD    Brief Narrative:  61 y.o. male with medical history significant of hypertension, hyperlipidemia, migraine headaches, thrombocytopenia, who presents with nausea, vomiting, feverand chills.   Patient states that he started having nausea, vomiting, fever and chills since yesterday morning. He has non-biliary, nonbloody vomitus.  He had fever for 103 at home. He saw his PCP in the office and was thought to have viral gastroenteritis. He seemed to get better and was able to keep fluids down throughout most of the day, but then had multiple episodes of vomiting again, about 9 episodes since about 6 PM.  Patient does not have chest pain, cough, shortness breath.  No diarrhea or abdominal pain.  He had mild lower abdominal pain earlier, which has completely resolved.  Patient denies typical symptoms of UTI, such as dysuria, burning on urination, increased urinary frequency and hematuria.  No penile discharge.  No unilateral weakness.  ED Course: patient was found to have positive urinalysis (cloudy appearance, moderate amount of leukocyte, many bacteria, WBC>50), WBC 3.4, potassium 3.2, creatinine 1.14, BUN 17, temperature 100.7, tachycardia, tachypnea, oxygen saturation 96% on room air. CT per renal stone protocol did not show kidneystone or hydronephrosis. Patient is placed on MedSurg bed for observation.   Assessment & Plan:   Principal Problem:   UTI (urinary tract infection) Active Problems:   Hyperlipidemia   Essential hypertension   Migraine   Thrombocytopenia (HCC)   Sepsis (HCC)   Hypokalemia  Sepsis due to UTI with Ecoli bacteremia - Pt was admitted with sepsis with WBC 3.4, fever, tachycardia, tachypnea.  Lactic acid elevated at 3.10 at presentation.   - Pt presently hemodynamically stable.  - Patient was started on rocephin empirically, will  continue -Patient has reported marked improvement overnight -4/4 blood cultures positive for ecoli, pending sensitivities -Most recent lactate of 2.2. Will continue IVF hydration. Repeat lactic acid   Hyperlipidemia: -Continue with Vytorin as tolerated  Essential hypertension:  -Presented with soft Blood pressures -Home BP meds were held -continue with IV hydralazine PRN as needed  Thrombocytopenia ([place]):  -chronically low -no signs of active bleeding -Repeat CBC in AM  Hypokalemia:  - K=3.2 on admission. - Replaced - repeat bmet in AM  Migraine headache:  -As needed sumatriptan continued  DVT prophylaxis: SCD's Code Status: Full Family Communication: Pt in room, family at bedside Disposition Plan: Uncertain at this time  Consultants:     Procedures:     Antimicrobials: Anti-infectives (From admission, onward)   Start     Dose/Rate Route Frequency Ordered Stop   02/26/18 0200  cefTRIAXone (ROCEPHIN) 1 g in sodium chloride 0.9 % 100 mL IVPB     1 g 200 mL/hr over 30 Minutes Intravenous Every 24 hours 02/25/18 0525     02/25/18 1515  cefTRIAXone (ROCEPHIN) 1 g in sodium chloride 0.9 % 100 mL IVPB     1 g 200 mL/hr over 30 Minutes Intravenous NOW 02/25/18 1442 02/26/18 1515   02/25/18 0215  cefTRIAXone (ROCEPHIN) 1 g in sodium chloride 0.9 % 100 mL IVPB     1 g 200 mL/hr over 30 Minutes Intravenous  Once 02/25/18 0205 02/25/18 0244       Subjective: Reports feeling better today  Objective: Vitals:   02/25/18 0336 02/25/18 0338 02/25/18 0510 02/25/18 1304  BP: 110/70  106/77 122/82  Pulse: 88  78 69  Resp:  16 16  Temp:  99.1 F (37.3 C) 98.9 F (37.2 C) 98.7 F (37.1 C)  TempSrc:  Oral Oral Oral  SpO2: 95%  98% 99%  Weight:   90.2 kg   Height:   6' (1.829 m)     Intake/Output Summary (Last 24 hours) at 02/25/2018 1442 Last data filed at 02/25/2018 0415 Gross per 24 hour  Intake 2576.68 ml  Output -  Net 2576.68 ml   Filed Weights    02/25/18 0058 02/25/18 0510  Weight: 89 kg 90.2 kg    Examination:  General exam: Appears calm and comfortable  Respiratory system: Clear to auscultation. Respiratory effort normal. Cardiovascular system: S1 & S2 heard, RRR Gastrointestinal system: Abdomen is nondistended, soft and nontender. No organomegaly or masses felt. Normal bowel sounds heard. Central nervous system: Alert and oriented. No focal neurological deficits. Extremities: Symmetric 5 x 5 power. Skin: No rashes, lesions Psychiatry: Judgement and insight appear normal. Mood & affect appropriate.   Data Reviewed: I have personally reviewed following labs and imaging studies  CBC: Recent Labs  Lab 02/25/18 0125  WBC 3.4*  NEUTROABS 3.3  HGB 14.3  HCT 42.3  MCV 94.2  PLT 71*   Basic Metabolic Panel: Recent Labs  Lab 02/25/18 0125  NA 132*  K 3.2*  CL 98  CO2 20*  GLUCOSE 128*  BUN 17  CREATININE 1.14  CALCIUM 8.9   GFR: Estimated Creatinine Clearance: 73.7 mL/min (by C-G formula based on SCr of 1.14 mg/dL). Liver Function Tests: Recent Labs  Lab 02/25/18 0125  AST 35  ALT 33  ALKPHOS 60  BILITOT 2.1*  PROT 6.8  ALBUMIN 4.0   Recent Labs  Lab 02/25/18 0125  LIPASE 23   No results for input(s): AMMONIA in the last 168 hours. Coagulation Profile: No results for input(s): INR, PROTIME in the last 168 hours. Cardiac Enzymes: No results for input(s): CKTOTAL, CKMB, CKMBINDEX, TROPONINI in the last 168 hours. BNP (last 3 results) No results for input(s): PROBNP in the last 8760 hours. HbA1C: No results for input(s): HGBA1C in the last 72 hours. CBG: Recent Labs  Lab 02/25/18 0050  GLUCAP 129*   Lipid Profile: No results for input(s): CHOL, HDL, LDLCALC, TRIG, CHOLHDL, LDLDIRECT in the last 72 hours. Thyroid Function Tests: No results for input(s): TSH, T4TOTAL, FREET4, T3FREE, THYROIDAB in the last 72 hours. Anemia Panel: No results for input(s): VITAMINB12, FOLATE, FERRITIN, TIBC,  IRON, RETICCTPCT in the last 72 hours. Sepsis Labs: Recent Labs  Lab 02/25/18 0133 02/25/18 0342 02/25/18 0547 02/25/18 0819  PROCALCITON  --   --  36.14  --   LATICACIDVEN 3.10* 1.56 2.1* 2.2*    Recent Results (from the past 240 hour(s))  Blood culture (routine x 2)     Status: None (Preliminary result)   Collection Time: 02/25/18  1:25 AM  Result Value Ref Range Status   Specimen Description   Final    BLOOD RIGHT HAND Performed at [facility], [address] [place], [place] [address]    Special Requests   Final    BOTTLES DRAWN AEROBIC AND ANAEROBIC Blood Culture adequate volume Performed at [facility], [address] [place], [place] [address]    Culture  Setup Time   Final    GRAM NEGATIVE RODS IN BOTH AEROBIC AND ANAEROBIC BOTTLES CRITICAL RESULT CALLED TO, READ BACK BY AND VERIFIED WITH: [place] K [id number] FCP Performed at [facility],  [address], [place], [place] [address]    Culture GRAM NEGATIVE RODS  Final   Report Status PENDING  Incomplete  Blood culture (routine x 2)     Status: None (Preliminary result)   Collection Time: 02/25/18  1:53 AM  Result Value Ref Range Status   Specimen Description   Final    BLOOD LEFT ANTECUBITAL Performed at [facility], [place] [address] [place], [place] [address]    Special Requests   Final    BOTTLES DRAWN AEROBIC AND ANAEROBIC Blood Culture adequate volume Performed at [facility], [address] [place], [place] [address]    Culture  Setup Time   Final    GRAM NEGATIVE RODS IN BOTH AEROBIC AND ANAEROBIC BOTTLES CRITICAL VALUE NOTED.  VALUE IS CONSISTENT WITH PREVIOUSLY REPORTED AND CALLED VALUE. Performed at [facility], [place] [address], [place],  [address]    Culture GRAM NEGATIVE RODS  Final   Report Status PENDING  Incomplete     Radiology Studies: Ct Renal Stone Study  Result Date: 02/25/2018 CLINICAL DATA:  62 year old male with  vomiting and flank pain. EXAM: CT ABDOMEN AND PELVIS WITHOUT CONTRAST TECHNIQUE: Multidetector CT imaging of the abdomen and pelvis was performed following the standard protocol without IV contrast. COMPARISON:  None. FINDINGS: Evaluation of this exam is limited in the absence of intravenous contrast. Lower chest: The visualized lung bases are clear. No intra-abdominal free air or free fluid. Hepatobiliary: Diffuse fatty infiltration of the liver. No intrahepatic biliary ductal dilatation. The gallbladder is unremarkable. Pancreas: Unremarkable. No pancreatic ductal dilatation or surrounding inflammatory changes. Spleen: Normal in size without focal abnormality. Adrenals/Urinary Tract: There is a 12 mm indeterminate right adrenal nodule. The left adrenal gland is unremarkable. There is no hydronephrosis or nephrolithiasis on either side. The visualized ureters and urinary bladder appear unremarkable. Stomach/Bowel: There is no bowel obstruction or active inflammation. There is a focal area of apparent thickening of the midportion of the transverse colon, likely related to underdistention. A mass is less likely but difficult to exclude in the absence of oral contrast. The appendix is normal. Vascular/Lymphatic: Mild aortoiliac atherosclerotic disease. No portal venous gas. There is no adenopathy. Reproductive: The prostate and seminal vesicles are grossly unremarkable. No pelvic mass. Other: None Musculoskeletal: No acute or significant osseous findings. IMPRESSION: 1. No acute intra-abdominal or pelvic pathology. No hydronephrosis or nephrolithiasis. 2. Fatty liver. 3. No bowel obstruction or active inflammation. Normal appendix. 4. Focal area of underdistention in the mid transverse colon versus less likely a mass. Correlation with colonoscopy findings recommended. Electronically Signed   By: Anner Crete M.D.   On: 02/25/2018 03:06    Scheduled Meds: . aspirin EC  81 mg Oral Daily  . ezetimibe-simvastatin   1 tablet Oral QHS  . fluticasone  2 spray Each Nare Daily  . multivitamin with minerals  1 tablet Oral Daily   Continuous Infusions: . sodium chloride 125 mL/hr at 02/25/18 1309  . [START ON 02/26/2018] cefTRIAXone (ROCEPHIN)  IV    . cefTRIAXone (ROCEPHIN)  IV       LOS: 0 days   Marylu Lund, MD [facility] Pager On Amion  If 7PM-7AM, please contact night-coverage 02/25/2018, 2:42 PM

## 2018-02-25 NOTE — Progress Notes (Signed)
CRITICAL VALUE ALERT  Critical Value:  Lactic acid 2.2  Date/Time of notification: 02/25/18 at 0944  Provider Notified: Dr. Wyline Copas  Orders Received/Actions taken: No new orders given. continue with current plan.

## 2018-02-25 NOTE — Progress Notes (Signed)
Dr Wyvonnia Dusky notified of lactic acid result 3.10 mmol/L

## 2018-02-26 DIAGNOSIS — A4151 Sepsis due to Escherichia coli [E. coli]: Secondary | ICD-10-CM | POA: Diagnosis not present

## 2018-02-26 DIAGNOSIS — D696 Thrombocytopenia, unspecified: Secondary | ICD-10-CM | POA: Diagnosis not present

## 2018-02-26 DIAGNOSIS — I10 Essential (primary) hypertension: Secondary | ICD-10-CM | POA: Diagnosis not present

## 2018-02-26 DIAGNOSIS — N10 Acute tubulo-interstitial nephritis: Secondary | ICD-10-CM

## 2018-02-26 DIAGNOSIS — A419 Sepsis, unspecified organism: Secondary | ICD-10-CM | POA: Diagnosis present

## 2018-02-26 DIAGNOSIS — N3000 Acute cystitis without hematuria: Secondary | ICD-10-CM | POA: Diagnosis not present

## 2018-02-26 DIAGNOSIS — E782 Mixed hyperlipidemia: Secondary | ICD-10-CM | POA: Diagnosis not present

## 2018-02-26 DIAGNOSIS — E876 Hypokalemia: Secondary | ICD-10-CM | POA: Diagnosis not present

## 2018-02-26 DIAGNOSIS — G43909 Migraine, unspecified, not intractable, without status migrainosus: Secondary | ICD-10-CM | POA: Diagnosis present

## 2018-02-26 DIAGNOSIS — Z87898 Personal history of other specified conditions: Secondary | ICD-10-CM

## 2018-02-26 DIAGNOSIS — K449 Diaphragmatic hernia without obstruction or gangrene: Secondary | ICD-10-CM | POA: Diagnosis present

## 2018-02-26 DIAGNOSIS — N12 Tubulo-interstitial nephritis, not specified as acute or chronic: Secondary | ICD-10-CM | POA: Diagnosis present

## 2018-02-26 DIAGNOSIS — E785 Hyperlipidemia, unspecified: Secondary | ICD-10-CM | POA: Diagnosis present

## 2018-02-26 LAB — BASIC METABOLIC PANEL
Anion gap: 9 (ref 5–15)
BUN: 12 mg/dL (ref 8–23)
CO2: 23 mmol/L (ref 22–32)
Calcium: 8.5 mg/dL — ABNORMAL LOW (ref 8.9–10.3)
Chloride: 105 mmol/L (ref 98–111)
Creatinine, Ser: 0.91 mg/dL (ref 0.61–1.24)
GFR calc Af Amer: >60 mL/min (ref >60)
GLUCOSE: 119 mg/dL — AB (ref 70–99)
POTASSIUM: 3.7 mmol/L (ref 3.5–5.1)
SODIUM: 137 mmol/L (ref 135–145)

## 2018-02-26 LAB — CBC
HCT: 41 % (ref 39.0–52.0)
HEMOGLOBIN: 13.4 g/dL (ref 13.0–17.0)
MCH: 31.7 pg (ref 26.0–34.0)
MCHC: 32.7 g/dL (ref 30.0–36.0)
MCV: 96.9 fL (ref 80.0–100.0)
Platelets: 49 10*3/uL — ABNORMAL LOW (ref 150–400)
RBC: 4.23 MIL/uL (ref 4.22–5.81)
RDW: 12.8 % (ref 11.5–15.5)
WBC: 7.5 10*3/uL (ref 4.0–10.5)
nRBC: 0 % (ref 0.0–0.2)

## 2018-02-26 LAB — MAGNESIUM: MAGNESIUM: 1.9 mg/dL (ref 1.7–2.4)

## 2018-02-26 MED ORDER — BISOPROLOL FUMARATE 5 MG PO TABS
5.0000 mg | ORAL_TABLET | Freq: Every day | ORAL | Status: DC
  Administered 2018-02-26 – 2018-02-28 (×3): 5 mg via ORAL
  Filled 2018-02-26 (×3): qty 1

## 2018-02-26 MED ORDER — ONDANSETRON HCL 4 MG/2ML IJ SOLN: 4.0000 mg | Freq: Four times a day (QID) | INTRAMUSCULAR | Status: DC | PRN

## 2018-02-26 NOTE — Progress Notes (Signed)
PROGRESS NOTE  Frank Joseph. OQH:[id number] DOB: 08-Nov-1955 DOA: 02/25/2018 PCP: Marin Olp, MD  Brief History:  61 y.o.malewith medical history significant ofhypertension, hyperlipidemia, migraine headaches, thrombocytopenia, who presents with nausea, vomiting, feverand chills starting 11/1 am.  He saw his PCP in the office and was thought to have viral gastroenteritis. He seemed to get better and was able to keep fluids down throughout most of the day,but then had multiple episodes of vomiting again,about 9 episodes since about 6 PM. Patient does not have chest pain, cough, shortness breath. No diarrhea or abdominal pain. He had mild lower abdominal pain earlier, which has completely resolved.   Patient was found to havepositive urinalysis (cloudy appearance, moderate amount of leukocyte, many bacteria, WBC>50),WBC 3.4, potassium 3.2,creatinine 1.14, BUN 17, temperature 100.7, tachycardia, tachypnea, oxygen saturation 96% on room air. CT per renal stone protocol did not show kidneystone or hydronephrosis.  Assessment/Plan: Sepsis -present at time of admission -due to UTI and bacteremia -Lactic acid peaked 2.1 -Procalcitonin 36.14 -Continue ceftriaxone 2 g daily pending final culture data  E. coli bacteremia -Source is urine -Continue ceftriaxone 2 g daily until final susceptibility data  Pyelonephritis -Patient presented with lower abdominal pain, pyuria, with nausea and vomiting -Continue ceftriaxone pending final culture data -02/25/2018 CT abdomen renal protocol--negative for nephrolithiasis or hydronephrosis.  No bowel obstruction or inflammatory changes. -Focal under distention of mid transverse colon--patient has had recent colonoscopy showing only polyps  Essential hypertension -Restart bisoprolol -Holding HCTZ and lisinopril due to soft blood pressure  Hyperlipidemia -Restart Vytorin  Thrombocytopenia -Appears to be chronic dating  back to April 2008 -Monitor for signs of bleeding  Hypokalemia -Replete -Check magnesium -A.m. BMP   Disposition Plan:   Home 11/4 pending final culture susceptibility Family Communication:   Spouse updated at bedside 11/3--.Total time spent 35 minutes.  Greater than 50% spent face to face counseling and coordinating care.   Consultants:  none  Code Status:  FULL / DNR  DVT Prophylaxis:  [place] Heparin / [place] Lovenox   Procedures: As Listed in Progress Note Above  Antibiotics: None    Subjective: Overall patient is feeling better.  He has some generalized weakness.  He denies any fevers, chills, chest pain, shortness breath, nausea, vomiting, diarrhea, abdominal pain.  His appetite is poor.  There is no dysuria or hematuria.  Objective: Vitals:   02/25/18 0510 02/25/18 1304 02/25/18 1928 02/26/18 1247  BP: 106/77 122/82 135/90 134/89  Pulse: 78 69 67 61  Resp: 16 16 14 18   Temp: 98.9 F (37.2 C) 98.7 F (37.1 C) 98.8 F (37.1 C) 98.8 F (37.1 C)  TempSrc: Oral Oral Oral Oral  SpO2: 98% 99% 98% 100%  Weight: 90.2 kg     Height: 6' (1.829 m)       Intake/Output Summary (Last 24 hours) at 02/26/2018 1622 Last data filed at 02/26/2018 1500 Gross per 24 hour  Intake 1353.48 ml  Output -  Net 1353.48 ml   Weight change:  Exam:   General:  Pt is alert, follows commands appropriately, not in acute distress  HEENT: No icterus, No thrush, No neck mass, [place]/AT  Cardiovascular: RRR, S1/S2, no rubs, no gallops  Respiratory: CTA bilaterally, no wheezing, no crackles, no rhonchi  Abdomen: Soft/+BS, non tender, non distended, no guarding  Extremities: No edema, No lymphangitis, No petechiae, No rashes, no synovitis   Data Reviewed: I have personally reviewed following labs and imaging  studies Basic Metabolic Panel: Recent Labs  Lab 02/25/18 0125 02/26/18 0541  NA 132* 137  K 3.2* 3.7  CL 98 105  CO2 20* 23  GLUCOSE 128* 119*  BUN 17 12  CREATININE 1.14 0.91   CALCIUM 8.9 8.5*  MG  --  1.9   Liver Function Tests: Recent Labs  Lab 02/25/18 0125  AST 35  ALT 33  ALKPHOS 60  BILITOT 2.1*  PROT 6.8  ALBUMIN 4.0   Recent Labs  Lab 02/25/18 0125  LIPASE 23   No results for input(s): AMMONIA in the last 168 hours. Coagulation Profile: No results for input(s): INR, PROTIME in the last 168 hours. CBC: Recent Labs  Lab 02/25/18 0125 02/26/18 0541  WBC 3.4* 7.5  NEUTROABS 3.3  --   HGB 14.3 13.4  HCT 42.3 41.0  MCV 94.2 96.9  PLT 71* 49*   Cardiac Enzymes: No results for input(s): CKTOTAL, CKMB, CKMBINDEX, TROPONINI in the last 168 hours. BNP: Invalid input(s): POCBNP CBG: Recent Labs  Lab 02/25/18 0050  GLUCAP 129*   HbA1C: No results for input(s): HGBA1C in the last 72 hours. Urine analysis:    Component Value Date/Time   COLORURINE YELLOW 02/25/2018 0142   APPEARANCEUR CLOUDY (A) 02/25/2018 0142   LABSPEC >1.030 (H) 02/25/2018 0142   PHURINE 5.0 02/25/2018 0142   GLUCOSEU 100 (A) 02/25/2018 0142   HGBUR MODERATE (A) 02/25/2018 0142   HGBUR negative 02/05/2010 0758   BILIRUBINUR SMALL (A) 02/25/2018 0142   BILIRUBINUR n 05/14/2016 1033   KETONESUR NEGATIVE 02/25/2018 0142   PROTEINUR 30 (A) 02/25/2018 0142   UROBILINOGEN 1.0 05/14/2016 1033   UROBILINOGEN 0.2 02/05/2010 0758   NITRITE POSITIVE (A) 02/25/2018 0142   LEUKOCYTESUR MODERATE (A) 02/25/2018 0142   Sepsis Labs: @LABRCNTIP (procalcitonin:4,lacticidven:4) ) Recent Results (from the past 240 hour(s))  Blood culture (routine x 2)     Status: Abnormal (Preliminary result)   Collection Time: 02/25/18  1:25 AM  Result Value Ref Range Status   Specimen Description   Final    BLOOD RIGHT HAND Performed at [facility], [address] [place], [place] [address]    Special Requests   Final    BOTTLES DRAWN AEROBIC AND ANAEROBIC Blood Culture adequate volume Performed at [facility], [address] [place], [place] [address]     Culture  Setup Time   Final    GRAM NEGATIVE RODS IN BOTH AEROBIC AND ANAEROBIC BOTTLES CRITICAL RESULT CALLED TO, READ BACK BY AND VERIFIED WITH: PHARMD MARY S [id number] FCP    Culture (A)  Final    ESCHERICHIA COLI SUSCEPTIBILITIES TO FOLLOW Performed at [facility], [place] [address], [place], [place] [address]    Report Status PENDING  Incomplete  Blood Culture ID Panel (Reflexed)     Status: Abnormal   Collection Time: 02/25/18  1:25 AM  Result Value Ref Range Status   Enterococcus species NOT DETECTED NOT DETECTED Final   Listeria monocytogenes NOT DETECTED NOT DETECTED Final   Staphylococcus species NOT DETECTED NOT DETECTED Final   Staphylococcus aureus (BCID) NOT DETECTED NOT DETECTED Final   Streptococcus species NOT DETECTED NOT DETECTED Final   Streptococcus agalactiae NOT DETECTED NOT DETECTED Final   Streptococcus pneumoniae NOT DETECTED NOT DETECTED Final   Streptococcus pyogenes NOT DETECTED NOT DETECTED Final   Acinetobacter baumannii NOT DETECTED NOT DETECTED Final   Enterobacteriaceae species DETECTED (A) NOT DETECTED Final    Comment: Enterobacteriaceae  represent a large family of gram-negative bacteria, not a single organism. CRITICAL RESULT CALLED TO, READ BACK BY AND VERIFIED WITH: PHARMD MARY S 1434 [id number] FCP    Enterobacter cloacae complex NOT DETECTED NOT DETECTED Final   Escherichia coli DETECTED (A) NOT DETECTED Final    Comment: CRITICAL RESULT CALLED TO, READ BACK BY AND VERIFIED WITH: PHARMD MARY S 1434 [id number] FCP    Klebsiella oxytoca NOT DETECTED NOT DETECTED Final   Klebsiella pneumoniae NOT DETECTED NOT DETECTED Final   Proteus species NOT DETECTED NOT DETECTED Final   Serratia marcescens NOT DETECTED NOT DETECTED Final   Carbapenem resistance NOT DETECTED NOT DETECTED Final   Haemophilus influenzae NOT DETECTED NOT DETECTED Final   Neisseria meningitidis NOT DETECTED NOT DETECTED Final   Pseudomonas aeruginosa NOT DETECTED NOT  DETECTED Final   Candida albicans NOT DETECTED NOT DETECTED Final   Candida glabrata NOT DETECTED NOT DETECTED Final   Candida krusei NOT DETECTED NOT DETECTED Final   Candida parapsilosis NOT DETECTED NOT DETECTED Final   Candida tropicalis NOT DETECTED NOT DETECTED Final    Comment: Performed at [facility], [place] [address], [place], [place] [address]  Urine Culture     Status: Abnormal (Preliminary result)   Collection Time: 02/25/18  1:42 AM  Result Value Ref Range Status   Specimen Description   Final    URINE, RANDOM Performed at [facility], [address] [place], [place] [address]    Special Requests   Final    NONE Performed at [facility], [address] [place], [place] [address]    Culture >=100,000 COLONIES/mL ESCHERICHIA COLI (A)  Final   Report Status PENDING  Incomplete  Blood culture (routine x 2)     Status: Abnormal (Preliminary result)   Collection Time: 02/25/18  1:53 AM  Result Value Ref Range Status   Specimen Description   Final    BLOOD LEFT ANTECUBITAL Performed at [facility], [place] [address], [place], [place] [address]    Special Requests   Final    BOTTLES DRAWN AEROBIC AND ANAEROBIC Blood Culture adequate volume Performed at [facility], [address] [place], [place] [address]    Culture  Setup Time   Final    GRAM NEGATIVE RODS IN BOTH AEROBIC AND ANAEROBIC BOTTLES CRITICAL VALUE NOTED.  VALUE IS CONSISTENT WITH PREVIOUSLY REPORTED AND CALLED VALUE.    Culture (A)  Final    ESCHERICHIA COLI SUSCEPTIBILITIES TO FOLLOW Performed at [facility], [place] [address] [place], [place] [address]    Report Status PENDING  Incomplete     Scheduled Meds: . aspirin EC  81 mg Oral Daily  . ezetimibe-simvastatin  1 tablet Oral QHS  . fluticasone  2 spray Each Nare Daily  . multivitamin with minerals  1 tablet Oral Daily   Continuous Infusions: . cefTRIAXone (ROCEPHIN)  IV 2 g (02/26/18 0804)      Procedures/Studies: Ct Renal Stone Study  Result Date: 02/25/2018 CLINICAL DATA:  62 year old male with vomiting and flank pain. EXAM: CT ABDOMEN AND PELVIS WITHOUT CONTRAST TECHNIQUE: Multidetector CT imaging of the abdomen and pelvis was performed following the standard protocol without IV contrast. COMPARISON:  None. FINDINGS: Evaluation of this exam is limited in the absence of intravenous contrast. Lower chest: The visualized lung bases are clear. No intra-abdominal free air or free fluid. Hepatobiliary: Diffuse fatty infiltration of the liver. No intrahepatic  biliary ductal dilatation. The gallbladder is unremarkable. Pancreas: Unremarkable. No pancreatic ductal dilatation or surrounding inflammatory changes. Spleen: Normal in size without focal abnormality. Adrenals/Urinary Tract: There is a 12 mm indeterminate right adrenal nodule. The left adrenal gland is unremarkable. There is no hydronephrosis or nephrolithiasis on either side. The visualized ureters and urinary bladder appear unremarkable. Stomach/Bowel: There is no bowel obstruction or active inflammation. There is a focal area of apparent thickening of the midportion of the transverse colon, likely related to underdistention. A mass is less likely but difficult to exclude in the absence of oral contrast. The appendix is normal. Vascular/Lymphatic: Mild aortoiliac atherosclerotic disease. No portal venous gas. There is no adenopathy. Reproductive: The prostate and seminal vesicles are grossly unremarkable. No pelvic mass. Other: None Musculoskeletal: No acute or significant osseous findings. IMPRESSION: 1. No acute intra-abdominal or pelvic pathology. No hydronephrosis or nephrolithiasis. 2. Fatty liver. 3. No bowel obstruction or active inflammation. Normal appendix. 4. Focal area of underdistention in the mid transverse colon versus less likely a mass. Correlation with colonoscopy findings recommended. Electronically Signed   By: Anner Crete M.D.   On: 02/25/2018 03:06    Orson Eva, DO  [facility] Pager [phone]  If 7PM-7AM, please contact night-coverage [email or web address] Password [email or web address] 02/26/2018, 4:22 PM   LOS: 0 days

## 2018-02-27 DIAGNOSIS — D696 Thrombocytopenia, unspecified: Secondary | ICD-10-CM

## 2018-02-27 DIAGNOSIS — R7881 Bacteremia: Secondary | ICD-10-CM

## 2018-02-27 DIAGNOSIS — I10 Essential (primary) hypertension: Secondary | ICD-10-CM

## 2018-02-27 DIAGNOSIS — A4151 Sepsis due to Escherichia coli [E. coli]: Principal | ICD-10-CM

## 2018-02-27 DIAGNOSIS — N3000 Acute cystitis without hematuria: Secondary | ICD-10-CM

## 2018-02-27 DIAGNOSIS — E782 Mixed hyperlipidemia: Secondary | ICD-10-CM

## 2018-02-27 LAB — BASIC METABOLIC PANEL
ANION GAP: 9 (ref 5–15)
BUN: 12 mg/dL (ref 8–23)
CALCIUM: 8.6 mg/dL — AB (ref 8.9–10.3)
CHLORIDE: 108 mmol/L (ref 98–111)
CO2: 22 mmol/L (ref 22–32)
Creatinine, Ser: 0.89 mg/dL (ref 0.61–1.24)
GFR calc Af Amer: >60 mL/min (ref >60)
GFR calc non Af Amer: >60 mL/min (ref >60)
Glucose, Bld: 119 mg/dL — ABNORMAL HIGH (ref 70–99)
POTASSIUM: 4.1 mmol/L (ref 3.5–5.1)
Sodium: 139 mmol/L (ref 135–145)

## 2018-02-27 LAB — CULTURE, BLOOD (ROUTINE X 2)
SPECIAL REQUESTS: ADEQUATE
Special Requests: ADEQUATE

## 2018-02-27 LAB — CBC
HCT: 39.5 % (ref 39.0–52.0)
Hemoglobin: 13.3 g/dL (ref 13.0–17.0)
MCH: 32.2 pg (ref 26.0–34.0)
MCHC: 33.7 g/dL (ref 30.0–36.0)
MCV: 95.6 fL (ref 80.0–100.0)
NRBC: 0 % (ref 0.0–0.2)
PLATELETS: 60 10*3/uL — AB (ref 150–400)
RBC: 4.13 MIL/uL — AB (ref 4.22–5.81)
RDW: 12.4 % (ref 11.5–15.5)
WBC: 6.9 10*3/uL (ref 4.0–10.5)

## 2018-02-27 LAB — URINE CULTURE

## 2018-02-27 MED ORDER — ENOXAPARIN SODIUM 40 MG/0.4ML ~~LOC~~ SOLN
40.0000 mg | SUBCUTANEOUS | Status: DC
  Filled 2018-02-27: qty 0.4

## 2018-02-27 MED ORDER — HYDROCORTISONE 2.5 % RE CREA
TOPICAL_CREAM | Freq: Two times a day (BID) | RECTAL | Status: DC
  Administered 2018-02-27: 1 via RECTAL
  Administered 2018-02-28: 10:00:00 via RECTAL
  Filled 2018-02-27 (×2): qty 28.35

## 2018-02-27 MED ORDER — VITAMINS A & D EX OINT
TOPICAL_OINTMENT | CUTANEOUS | Status: AC
  Administered 2018-02-27: 17:00:00
  Filled 2018-02-27: qty 5

## 2018-02-27 MED ORDER — HYDROCHLOROTHIAZIDE 10 MG/ML ORAL SUSPENSION
6.2500 mg | Freq: Every day | ORAL | Status: DC
  Administered 2018-02-27 – 2018-02-28 (×2): 6.25 mg via ORAL
  Filled 2018-02-27 (×3): qty 1.25

## 2018-02-27 MED ORDER — LISINOPRIL 5 MG PO TABS
5.0000 mg | ORAL_TABLET | Freq: Every day | ORAL | Status: DC
Start: 2018-02-27 — End: 2018-02-28
  Administered 2018-02-27 – 2018-02-28 (×2): 5 mg via ORAL
  Filled 2018-02-27 (×2): qty 1

## 2018-02-27 NOTE — Progress Notes (Signed)
PROGRESS NOTE  Frank Joseph. TIW:[id number] DOB: 1956-04-25 DOA: 02/25/2018 PCP: Marin Olp, MD  Brief History:  61 y.o.malewith medical history significant ofhypertension, hyperlipidemia, migraine headaches, thrombocytopenia, who presents with nausea, vomiting, feverand chills starting 11/1 am.  He saw his PCP in the office and was thought to have viral gastroenteritis. He seemed to get better and was able to keep fluids down throughout most of the day,but then had multiple episodes of vomiting again,about 9 episodes since about 6 PM. Patient does not have chest pain, cough, shortness breath. No diarrhea or abdominal pain. He had mild lower abdominal pain earlier, which has completely resolved.   Patient was found to havepositive urinalysis (cloudy appearance, moderate amount of leukocyte, many bacteria, WBC>50),WBC 3.4, potassium 3.2,creatinine 1.14, BUN 17, temperature 100.7, tachycardia, tachypnea, oxygen saturation 96% on room air. CT per renal stone protocol did not show kidneystone or hydronephrosis. Pt admitted for further management  Assessment/Plan: Sepsis 2/2 E. Coli bacteremia 2/2 UTI -Currently afebrile with no leukocytosis -Lactic acid peaked 2.1-->1.3 -Procalcitonin 36.14, will trend -BC X 2 grew E. Coli, repeat pending -UA positive for infection, UC grew pan-sensitive E.Coli -CT abdomen renal protocol--negative for nephrolithiasis or hydronephrosis.  No bowel obstruction or inflammatory changes, also showed focal under distention of mid transverse colon, recommending a colonoscopy of which patient recently had that showed only polyps -Continue ceftriaxone 2 g daily  Essential hypertension -Continue home bisoprolol, restart home HCTZ, lisinopril  Hyperlipidemia -Restart Vytorin  Thrombocytopenia -Appears to be chronic dating back to April 2008 -Monitor for signs of bleeding  Hypokalemia -Replete prn   Disposition Plan: Home  likely on 11/5  Family Communication: Spouse updated at bedside 11/4  Consultants:  None  Code Status:  FULL / DNR  DVT Prophylaxis: Candelaria Lovenox   Procedures: None  Antibiotics: Ceftriaxone    Subjective: Pt reports feeling better overall, compared to presentation. Reports he felt warm earlier today. Anxious that his BP is on the higher side. Also reports some rectal discomfort due to known hemorrhoids.  Objective: Vitals:   02/26/18 1247 02/26/18 2012 02/27/18 0458 02/27/18 1353  BP: 134/89 (!) 144/84 (!) 140/96 (!) 151/93  Pulse: 61 69 72 62  Resp: 18 16 14 16   Temp: 98.8 F (37.1 C) 98.2 F (36.8 C) 98.6 F (37 C) 99 F (37.2 C)  TempSrc: Oral Oral Oral Oral  SpO2: 100% 99% 100% 99%  Weight:      Height:        Intake/Output Summary (Last 24 hours) at 02/27/2018 1512 Last data filed at 02/27/2018 1300 Gross per 24 hour  Intake 480 ml  Output -  Net 480 ml   Weight change:  Exam:   General: NAD   Cardiovascular: S1, S2 present  Respiratory: CTAB  Abdomen: Soft, nontender, nondistended, bowel sounds present  Musculoskeletal: No bilateral pedal edema noted  Skin: Normal  Psychiatry: No new focal neurologic deficits noted    Data Reviewed: I have personally reviewed following labs and imaging studies Basic Metabolic Panel: Recent Labs  Lab 02/25/18 0125 02/26/18 0541 02/27/18 0513  NA 132* 137 139  K 3.2* 3.7 4.1  CL 98 105 108  CO2 20* 23 22  GLUCOSE 128* 119* 119*  BUN 17 12 12   CREATININE 1.14 0.91 0.89  CALCIUM 8.9 8.5* 8.6*  MG  --  1.9  --    Liver Function Tests: Recent Labs  Lab 02/25/18 0125  AST  35  ALT 33  ALKPHOS 60  BILITOT 2.1*  PROT 6.8  ALBUMIN 4.0   Recent Labs  Lab 02/25/18 0125  LIPASE 23   No results for input(s): AMMONIA in the last 168 hours. Coagulation Profile: No results for input(s): INR, PROTIME in the last 168 hours. CBC: Recent Labs  Lab 02/25/18 0125 02/26/18 0541 02/27/18 0513  WBC  3.4* 7.5 6.9  NEUTROABS 3.3  --   --   HGB 14.3 13.4 13.3  HCT 42.3 41.0 39.5  MCV 94.2 96.9 95.6  PLT 71* 49* 60*   Cardiac Enzymes: No results for input(s): CKTOTAL, CKMB, CKMBINDEX, TROPONINI in the last 168 hours. BNP: Invalid input(s): POCBNP CBG: Recent Labs  Lab 02/25/18 0050  GLUCAP 129*   HbA1C: No results for input(s): HGBA1C in the last 72 hours. Urine analysis:    Component Value Date/Time   COLORURINE YELLOW 02/25/2018 0142   APPEARANCEUR CLOUDY (A) 02/25/2018 0142   LABSPEC >1.030 (H) 02/25/2018 0142   PHURINE 5.0 02/25/2018 0142   GLUCOSEU 100 (A) 02/25/2018 0142   HGBUR MODERATE (A) 02/25/2018 0142   HGBUR negative 02/05/2010 0758   BILIRUBINUR SMALL (A) 02/25/2018 0142   BILIRUBINUR n 05/14/2016 1033   KETONESUR NEGATIVE 02/25/2018 0142   PROTEINUR 30 (A) 02/25/2018 0142   UROBILINOGEN 1.0 05/14/2016 1033   UROBILINOGEN 0.2 02/05/2010 0758   NITRITE POSITIVE (A) 02/25/2018 0142   LEUKOCYTESUR MODERATE (A) 02/25/2018 0142   Sepsis Labs: @LABRCNTIP (procalcitonin:4,lacticidven:4) ) Recent Results (from the past 240 hour(s))  Blood culture (routine x 2)     Status: Abnormal   Collection Time: 02/25/18  1:25 AM  Result Value Ref Range Status   Specimen Description   Final    BLOOD RIGHT HAND Performed at [facility], [address] [place], [place] [address]    Special Requests   Final    BOTTLES DRAWN AEROBIC AND ANAEROBIC Blood Culture adequate volume Performed at [facility], [address] [place], [place] [address]    Culture  Setup Time   Final    GRAM NEGATIVE RODS IN BOTH AEROBIC AND ANAEROBIC BOTTLES CRITICAL RESULT CALLED TO, READ BACK BY AND VERIFIED WITH: Adams [id number] FCP Performed at [facility], [place] [address], [place], [place] [address]    Culture ESCHERICHIA COLI (A)  Final   Report Status 02/27/2018 FINAL  Final   Organism ID, Bacteria ESCHERICHIA COLI  Final      Susceptibility    Escherichia coli - MIC*    AMPICILLIN <=2 SENSITIVE Sensitive     CEFAZOLIN <=4 SENSITIVE Sensitive     CEFEPIME <=1 SENSITIVE Sensitive     CEFTAZIDIME <=1 SENSITIVE Sensitive     CEFTRIAXONE <=1 SENSITIVE Sensitive     CIPROFLOXACIN <=0.25 SENSITIVE Sensitive     GENTAMICIN <=1 SENSITIVE Sensitive     IMIPENEM <=0.25 SENSITIVE Sensitive     TRIMETH/SULFA <=20 SENSITIVE Sensitive     AMPICILLIN/SULBACTAM <=2 SENSITIVE Sensitive     PIP/TAZO <=4 SENSITIVE Sensitive     Extended ESBL NEGATIVE Sensitive     * ESCHERICHIA COLI  Blood Culture ID Panel (Reflexed)     Status: Abnormal   Collection Time: 02/25/18  1:25 AM  Result Value Ref Range Status   Enterococcus species NOT DETECTED NOT DETECTED Final   Listeria monocytogenes NOT DETECTED NOT DETECTED Final   Staphylococcus species NOT DETECTED NOT DETECTED Final   Staphylococcus aureus (BCID) NOT DETECTED  NOT DETECTED Final   Streptococcus species NOT DETECTED NOT DETECTED Final   Streptococcus agalactiae NOT DETECTED NOT DETECTED Final   Streptococcus pneumoniae NOT DETECTED NOT DETECTED Final   Streptococcus pyogenes NOT DETECTED NOT DETECTED Final   Acinetobacter baumannii NOT DETECTED NOT DETECTED Final   Enterobacteriaceae species DETECTED (A) NOT DETECTED Final    Comment: Enterobacteriaceae represent a large family of gram-negative bacteria, not a single organism. CRITICAL RESULT CALLED TO, READ BACK BY AND VERIFIED WITH: PHARMD MARY S [id number] FCP    Enterobacter cloacae complex NOT DETECTED NOT DETECTED Final   Escherichia coli DETECTED (A) NOT DETECTED Final    Comment: CRITICAL RESULT CALLED TO, READ BACK BY AND VERIFIED WITH: PHARMD MARY S [id number] FCP    Klebsiella oxytoca NOT DETECTED NOT DETECTED Final   Klebsiella pneumoniae NOT DETECTED NOT DETECTED Final   Proteus species NOT DETECTED NOT DETECTED Final   Serratia marcescens NOT DETECTED NOT DETECTED Final   Carbapenem resistance NOT DETECTED NOT  DETECTED Final   Haemophilus influenzae NOT DETECTED NOT DETECTED Final   Neisseria meningitidis NOT DETECTED NOT DETECTED Final   Pseudomonas aeruginosa NOT DETECTED NOT DETECTED Final   Candida albicans NOT DETECTED NOT DETECTED Final   Candida glabrata NOT DETECTED NOT DETECTED Final   Candida krusei NOT DETECTED NOT DETECTED Final   Candida parapsilosis NOT DETECTED NOT DETECTED Final   Candida tropicalis NOT DETECTED NOT DETECTED Final    Comment: Performed at [facility], [place] [address] [place], [place] [address]  Urine Culture     Status: Abnormal   Collection Time: 02/25/18  1:42 AM  Result Value Ref Range Status   Specimen Description   Final    URINE, RANDOM Performed at [facility], [address] [place], [place] [address]    Special Requests   Final    NONE Performed at [facility], [address] [place], [place] [address]    Culture >=100,000 COLONIES/mL ESCHERICHIA COLI (A)  Final   Report Status 02/27/2018 FINAL  Final   Organism ID, Bacteria ESCHERICHIA COLI (A)  Final      Susceptibility   Escherichia coli - MIC*    AMPICILLIN <=2 SENSITIVE Sensitive     CEFAZOLIN <=4 SENSITIVE Sensitive     CEFTRIAXONE <=1 SENSITIVE Sensitive     CIPROFLOXACIN <=0.25 SENSITIVE Sensitive     GENTAMICIN <=1 SENSITIVE Sensitive     IMIPENEM <=0.25 SENSITIVE Sensitive     NITROFURANTOIN <=16 SENSITIVE Sensitive     TRIMETH/SULFA <=20 SENSITIVE Sensitive     AMPICILLIN/SULBACTAM <=2 SENSITIVE Sensitive     PIP/TAZO <=4 SENSITIVE Sensitive     Extended ESBL NEGATIVE Sensitive     * >=100,000 COLONIES/mL ESCHERICHIA COLI  Blood culture (routine x 2)     Status: Abnormal   Collection Time: 02/25/18  1:53 AM  Result Value Ref Range Status   Specimen Description   Final    BLOOD LEFT ANTECUBITAL Performed at [facility], [place] [address], [place], [place] [address]    Special Requests   Final    BOTTLES DRAWN AEROBIC AND ANAEROBIC Blood  Culture adequate volume Performed at [facility], [address] [place], [place] [address]    Culture  Setup Time   Final    GRAM NEGATIVE RODS IN BOTH AEROBIC AND ANAEROBIC BOTTLES CRITICAL VALUE NOTED.  VALUE IS CONSISTENT WITH PREVIOUSLY REPORTED AND CALLED VALUE.  Culture (A)  Final    ESCHERICHIA COLI SUSCEPTIBILITIES PERFORMED ON PREVIOUS CULTURE WITHIN THE LAST 5 DAYS. Performed at [facility], [place] [address], [place], [place] [address]    Report Status 02/27/2018 FINAL  Final     Scheduled Meds: . aspirin EC  81 mg Oral Daily  . bisoprolol  5 mg Oral Daily  . ezetimibe-simvastatin  1 tablet Oral QHS  . fluticasone  2 spray Each Nare Daily  . multivitamin with minerals  1 tablet Oral Daily  . vitamin A & D       Continuous Infusions: . cefTRIAXone (ROCEPHIN)  IV 2 g (02/27/18 0751)    Procedures/Studies: Ct Renal Stone Study  Result Date: 02/25/2018 CLINICAL DATA:  62 year old male with vomiting and flank pain. EXAM: CT ABDOMEN AND PELVIS WITHOUT CONTRAST TECHNIQUE: Multidetector CT imaging of the abdomen and pelvis was performed following the standard protocol without IV contrast. COMPARISON:  None. FINDINGS: Evaluation of this exam is limited in the absence of intravenous contrast. Lower chest: The visualized lung bases are clear. No intra-abdominal free air or free fluid. Hepatobiliary: Diffuse fatty infiltration of the liver. No intrahepatic biliary ductal dilatation. The gallbladder is unremarkable. Pancreas: Unremarkable. No pancreatic ductal dilatation or surrounding inflammatory changes. Spleen: Normal in size without focal abnormality. Adrenals/Urinary Tract: There is a 12 mm indeterminate right adrenal nodule. The left adrenal gland is unremarkable. There is no hydronephrosis or nephrolithiasis on either side. The visualized ureters and urinary bladder appear unremarkable. Stomach/Bowel: There is no bowel obstruction or active inflammation. There  is a focal area of apparent thickening of the midportion of the transverse colon, likely related to underdistention. A mass is less likely but difficult to exclude in the absence of oral contrast. The appendix is normal. Vascular/Lymphatic: Mild aortoiliac atherosclerotic disease. No portal venous gas. There is no adenopathy. Reproductive: The prostate and seminal vesicles are grossly unremarkable. No pelvic mass. Other: None Musculoskeletal: No acute or significant osseous findings. IMPRESSION: 1. No acute intra-abdominal or pelvic pathology. No hydronephrosis or nephrolithiasis. 2. Fatty liver. 3. No bowel obstruction or active inflammation. Normal appendix. 4. Focal area of underdistention in the mid transverse colon versus less likely a mass. Correlation with colonoscopy findings recommended. Electronically Signed   By: Anner Crete M.D.   On: 02/25/2018 03:06    Alma Friendly, MD  [facility]  If 7PM-7AM, please contact night-coverage [email or web address] 02/27/2018, 3:12 PM   LOS: 1 day

## 2018-02-28 DIAGNOSIS — G43809 Other migraine, not intractable, without status migrainosus: Secondary | ICD-10-CM

## 2018-02-28 DIAGNOSIS — E876 Hypokalemia: Secondary | ICD-10-CM

## 2018-02-28 LAB — BASIC METABOLIC PANEL
Anion gap: 12 (ref 5–15)
BUN: 13 mg/dL (ref 8–23)
CALCIUM: 8.9 mg/dL (ref 8.9–10.3)
CO2: 24 mmol/L (ref 22–32)
CREATININE: 0.92 mg/dL (ref 0.61–1.24)
Chloride: 100 mmol/L (ref 98–111)
GFR calc Af Amer: >60 mL/min (ref >60)
GFR calc non Af Amer: >60 mL/min (ref >60)
GLUCOSE: 112 mg/dL — AB (ref 70–99)
Potassium: 3.9 mmol/L (ref 3.5–5.1)
Sodium: 136 mmol/L (ref 135–145)

## 2018-02-28 LAB — CBC WITH DIFFERENTIAL/PLATELET
Abs Immature Granulocytes: 0.13 10*3/uL — ABNORMAL HIGH (ref 0.00–0.07)
Basophils Absolute: 0 10*3/uL (ref 0.0–0.1)
Basophils Relative: 1 %
EOS ABS: 0.1 10*3/uL (ref 0.0–0.5)
EOS PCT: 2 %
HEMATOCRIT: 41.3 % (ref 39.0–52.0)
Hemoglobin: 13.7 g/dL (ref 13.0–17.0)
Immature Granulocytes: 2 %
LYMPHS ABS: 0.9 10*3/uL (ref 0.7–4.0)
Lymphocytes Relative: 13 %
MCH: 31.6 pg (ref 26.0–34.0)
MCHC: 33.2 g/dL (ref 30.0–36.0)
MCV: 95.2 fL (ref 80.0–100.0)
Monocytes Absolute: 0.9 10*3/uL (ref 0.1–1.0)
Monocytes Relative: 14 %
NRBC: 0 % (ref 0.0–0.2)
Neutro Abs: 4.9 10*3/uL (ref 1.7–7.7)
Neutrophils Relative %: 68 %
Platelets: 84 10*3/uL — ABNORMAL LOW (ref 150–400)
RBC: 4.34 MIL/uL (ref 4.22–5.81)
RDW: 12.4 % (ref 11.5–15.5)
WBC: 7 10*3/uL (ref 4.0–10.5)

## 2018-02-28 LAB — PROCALCITONIN: Procalcitonin: 8.33 ng/mL

## 2018-02-28 MED ORDER — AMOXICILLIN 875 MG PO TABS: 875.0000 mg | ORAL_TABLET | Freq: Two times a day (BID) | ORAL | 0 refills | Status: AC

## 2018-02-28 NOTE — Discharge Summary (Addendum)
Discharge Summary  Frank Joseph. WNU:[id number] DOB: [birth year]  PCP: Frank Olp, MD  Admit date: 02/25/2018 Discharge date: 02/28/2018  Time spent: 45 mins  Recommendations for Outpatient Follow-up:  1. PCP in 1 week  Discharge Diagnoses:  Active Hospital Problems   Diagnosis Date Noted  . UTI (urinary tract infection) 02/25/2018  . Sepsis due to Escherichia coli ([facility]) 02/26/2018  . Acute pyelonephritis 02/26/2018  . Sepsis ([facility]) 02/25/2018  . Hypokalemia 02/25/2018  . Thrombocytopenia ([facility]) 05/21/2016  . Essential hypertension 12/09/2009  . Hyperlipidemia 03/07/2007  . Migraine 12/08/2006    Resolved Hospital Problems  No resolved problems to display.    Discharge Condition: Stable  Diet recommendation: Heart healthy  Vitals:   02/27/18 2146 02/28/18 0522  BP: 135/90 (!) 141/94  Pulse:  70  Resp:  19  Temp: 98.2 F (36.8 C) 99 F (37.2 C)  SpO2:  99%    History of present illness:  62 y.o.malewith medical history significant ofhypertension, hyperlipidemia, migraine headaches, thrombocytopenia, who presents with nausea, vomiting, feverand chills starting 11/1 am. He saw his PCP in the office and was thought to have viral gastroenteritis. He seemed to get better and was able to keep fluids down throughout most of the day,but then had multiple episodes of vomiting again,about 9 episodes since about 6 PM. Patient does not have chest pain, cough, shortness breath. No diarrhea or abdominal pain. He had mild lower abdominal pain earlier, which has completely resolved.   Patient was found to havepositive urinalysis (cloudy appearance, moderate amount of leukocyte, many bacteria, WBC>50),WBC 3.4, potassium 3.2,creatinine 1.14, BUN 17, temperature 100.7, tachycardia, tachypnea, oxygen saturation 96% on room air. CT per renal stone protocol did not show kidneystone or hydronephrosis. Pt admitted for further management.   Today, pt reported  feeling much better, denies any new complaints, no fever/chills, abdominal pain, nausea/vomiting, chest pain. All questions answered. Pt stable for discharge.  Hospital Course:  Principal Problem:   UTI (urinary tract infection) Active Problems:   Hyperlipidemia   Essential hypertension   Migraine   Thrombocytopenia (HCC)   Sepsis (HCC)   Hypokalemia   Sepsis due to Escherichia coli ([facility])   Acute pyelonephritis  Sepsis 2/2 E. Coli bacteremia 2/2 UTI -Currently afebrile with no leukocytosis -Lactic acid peaked 2.1-->1.3 -Procalcitonin 36.14-->8.33 -BC X 2 grew E. Coli, repeat NGTD -UA positive for infection, UC grew pan-sensitive E.Coli -CT abdomen renal protocol--negative for nephrolithiasis or hydronephrosis.  No bowel obstruction or inflammatory changes, also showed focal under distention of mid transverse colon, recommending a colonoscopy of which patient recently had that showed only polyps -Spoke to ID Dr Linus Salmons, recommend to switch patient to PO amoxicillin for 7-10 days -S/P ceftriaxone 2 g daily, will switch to PO amoxicillin BID for a total of 10 days of AB  Essential hypertension -Continue home bisoprolol, HCTZ, lisinopril  Hyperlipidemia -Restart Vytorin  Thrombocytopenia -Appears to be chronic dating back to April 2008 -Follow up with PCP  Hypokalemia -Replete prn -Follow up with PCP   Procedures:  None  Consultations:  None  Discharge Exam: BP (!) 141/94 (BP Location: Left Arm)   Pulse 70   Temp 99 F (37.2 C) (Oral)   Resp 19   Ht 6' (1.829 m)   Wt 90.2 kg   SpO2 99%   BMI 26.97 kg/m   General: NAD  Cardiovascular: S1, S2 present  Respiratory: CTAB  Discharge Instructions You were cared for by a hospitalist during your hospital stay. If you  have any questions about your discharge medications or the care you received while you were in the hospital after you are discharged, you can call the unit and asked to speak with the hospitalist on  call if the hospitalist that took care of you is not available. Once you are discharged, your primary care physician will handle any further medical issues. Please note that NO REFILLS for any discharge medications will be authorized once you are discharged, as it is imperative that you return to your primary care physician (or establish a relationship with a primary care physician if you do not have one) for your aftercare needs so that they can reassess your need for medications and monitor your lab values.   Allergies as of 02/28/2018   No Known Allergies     Medication List    TAKE these medications   amoxicillin 875 MG tablet Commonly known as:  AMOXIL Take 1 tablet (875 mg total) by mouth 2 (two) times daily for 7 days.   aspirin 81 MG tablet Take 81 mg by mouth daily.   bisoprolol-hydrochlorothiazide 10-6.25 MG tablet Commonly known as:  ZIAC TAKE 1 TABLET BY MOUTH DAILY   ezetimibe-simvastatin 10-20 MG tablet Commonly known as:  VYTORIN TAKE 1 TABLET BY MOUTH AT BEDTIME   lisinopril 5 MG tablet Commonly known as:  PRINIVIL,ZESTRIL TAKE 1 TABLET(5 MG) BY MOUTH DAILY What changed:    how much to take  how to take this  when to take this   multivitamin per tablet Take 1 tablet by mouth daily.   ondansetron 8 MG disintegrating tablet Commonly known as:  ZOFRAN-ODT Take 1 tablet (8 mg total) by mouth every 8 (eight) hours as needed for nausea or vomiting.   SUMAtriptan 25 MG tablet Commonly known as:  IMITREX Take 1 tablet (25 mg total) by mouth every 2 (two) hours as needed.   triamcinolone 55 MCG/ACT Aero nasal inhaler Commonly known as:  NASACORT Place 2 sprays into the nose daily.   vitamin B-12 1000 MCG tablet Commonly known as:  CYANOCOBALAMIN Take 5,000 mcg by mouth daily.      No Known Allergies Follow-up Information    Frank Olp, MD. Schedule an appointment as soon as possible for a visit in 1 week(s).   Specialty:  Family  Medicine Contact information: [address] [place] [address] [phone]            The results of significant diagnostics from this hospitalization (including imaging, microbiology, ancillary and laboratory) are listed below for reference.    Significant Diagnostic Studies: Ct Renal Stone Study  Result Date: 02/25/2018 CLINICAL DATA:  62 year old male with vomiting and flank pain. EXAM: CT ABDOMEN AND PELVIS WITHOUT CONTRAST TECHNIQUE: Multidetector CT imaging of the abdomen and pelvis was performed following the standard protocol without IV contrast. COMPARISON:  None. FINDINGS: Evaluation of this exam is limited in the absence of intravenous contrast. Lower chest: The visualized lung bases are clear. No intra-abdominal free air or free fluid. Hepatobiliary: Diffuse fatty infiltration of the liver. No intrahepatic biliary ductal dilatation. The gallbladder is unremarkable. Pancreas: Unremarkable. No pancreatic ductal dilatation or surrounding inflammatory changes. Spleen: Normal in size without focal abnormality. Adrenals/Urinary Tract: There is a 12 mm indeterminate right adrenal nodule. The left adrenal gland is unremarkable. There is no hydronephrosis or nephrolithiasis on either side. The visualized ureters and urinary bladder appear unremarkable. Stomach/Bowel: There is no bowel obstruction or active inflammation. There is a focal area of apparent  thickening of the midportion of the transverse colon, likely related to underdistention. A mass is less likely but difficult to exclude in the absence of oral contrast. The appendix is normal. Vascular/Lymphatic: Mild aortoiliac atherosclerotic disease. No portal venous gas. There is no adenopathy. Reproductive: The prostate and seminal vesicles are grossly unremarkable. No pelvic mass. Other: None Musculoskeletal: No acute or significant osseous findings. IMPRESSION: 1. No acute intra-abdominal or pelvic pathology. No hydronephrosis  or nephrolithiasis. 2. Fatty liver. 3. No bowel obstruction or active inflammation. Normal appendix. 4. Focal area of underdistention in the mid transverse colon versus less likely a mass. Correlation with colonoscopy findings recommended. Electronically Signed   By: Anner Crete M.D.   On: 02/25/2018 03:06    Microbiology: Recent Results (from the past 240 hour(s))  Blood culture (routine x 2)     Status: Abnormal   Collection Time: 02/25/18  1:25 AM  Result Value Ref Range Status   Specimen Description   Final    BLOOD RIGHT HAND Performed at [facility], [address] [place], [place] [address]    Special Requests   Final    BOTTLES DRAWN AEROBIC AND ANAEROBIC Blood Culture adequate volume Performed at [facility], [address] [place], [place] [address]    Culture  Setup Time   Final    GRAM NEGATIVE RODS IN BOTH AEROBIC AND ANAEROBIC BOTTLES CRITICAL RESULT CALLED TO, READ BACK BY AND VERIFIED WITH: [place] [id number] FCP Performed at [facility], [place] [address] [place], [place] [address]    Culture ESCHERICHIA COLI (A)  Final   Report Status 02/27/2018 FINAL  Final   Organism ID, Bacteria ESCHERICHIA COLI  Final      Susceptibility   Escherichia coli - MIC*    AMPICILLIN <=2 SENSITIVE Sensitive     CEFAZOLIN <=4 SENSITIVE Sensitive     CEFEPIME <=1 SENSITIVE Sensitive     CEFTAZIDIME <=1 SENSITIVE Sensitive     CEFTRIAXONE <=1 SENSITIVE Sensitive     CIPROFLOXACIN <=0.25 SENSITIVE Sensitive     GENTAMICIN <=1 SENSITIVE Sensitive     IMIPENEM <=0.25 SENSITIVE Sensitive     TRIMETH/SULFA <=20 SENSITIVE Sensitive     AMPICILLIN/SULBACTAM <=2 SENSITIVE Sensitive     PIP/TAZO <=4 SENSITIVE Sensitive     Extended ESBL NEGATIVE Sensitive     * ESCHERICHIA COLI  Blood Culture ID Panel (Reflexed)     Status: Abnormal   Collection Time: 02/25/18  1:25 AM  Result Value Ref Range Status   Enterococcus species NOT DETECTED NOT  DETECTED Final   Listeria monocytogenes NOT DETECTED NOT DETECTED Final   Staphylococcus species NOT DETECTED NOT DETECTED Final   Staphylococcus aureus (BCID) NOT DETECTED NOT DETECTED Final   Streptococcus species NOT DETECTED NOT DETECTED Final   Streptococcus agalactiae NOT DETECTED NOT DETECTED Final   Streptococcus pneumoniae NOT DETECTED NOT DETECTED Final   Streptococcus pyogenes NOT DETECTED NOT DETECTED Final   Acinetobacter baumannii NOT DETECTED NOT DETECTED Final   Enterobacteriaceae species DETECTED (A) NOT DETECTED Final    Comment: Enterobacteriaceae represent a large family of gram-negative bacteria, not a single organism. CRITICAL RESULT CALLED TO, READ BACK BY AND VERIFIED WITH: PHARMD MARY S [id number] FCP    Enterobacter cloacae complex NOT DETECTED NOT DETECTED Final   Escherichia coli DETECTED (A) NOT DETECTED Final    Comment: CRITICAL RESULT CALLED TO, READ BACK BY AND VERIFIED WITH: [place] S  [id number] FCP    Klebsiella oxytoca NOT DETECTED NOT DETECTED Final   Klebsiella pneumoniae NOT DETECTED NOT DETECTED Final   Proteus species NOT DETECTED NOT DETECTED Final   Serratia marcescens NOT DETECTED NOT DETECTED Final   Carbapenem resistance NOT DETECTED NOT DETECTED Final   Haemophilus influenzae NOT DETECTED NOT DETECTED Final   Neisseria meningitidis NOT DETECTED NOT DETECTED Final   Pseudomonas aeruginosa NOT DETECTED NOT DETECTED Final   Candida albicans NOT DETECTED NOT DETECTED Final   Candida glabrata NOT DETECTED NOT DETECTED Final   Candida krusei NOT DETECTED NOT DETECTED Final   Candida parapsilosis NOT DETECTED NOT DETECTED Final   Candida tropicalis NOT DETECTED NOT DETECTED Final    Comment: Performed at [facility], [place] [address], [place], [place] [address]  Urine Culture     Status: Abnormal   Collection Time: 02/25/18  1:42 AM  Result Value Ref Range Status   Specimen Description   Final    URINE, RANDOM Performed at [facility], [address] [place], [place] [address]    Special Requests   Final    NONE Performed at [facility], [address] [place], [place] [address]    Culture >=100,000 COLONIES/mL ESCHERICHIA COLI (A)  Final   Report Status 02/27/2018 FINAL  Final   Organism ID, Bacteria ESCHERICHIA COLI (A)  Final      Susceptibility   Escherichia coli - MIC*    AMPICILLIN <=2 SENSITIVE Sensitive     CEFAZOLIN <=4 SENSITIVE Sensitive     CEFTRIAXONE <=1 SENSITIVE Sensitive     CIPROFLOXACIN <=0.25 SENSITIVE Sensitive     GENTAMICIN <=1 SENSITIVE Sensitive     IMIPENEM <=0.25 SENSITIVE Sensitive     NITROFURANTOIN <=16 SENSITIVE Sensitive     TRIMETH/SULFA <=20 SENSITIVE Sensitive     AMPICILLIN/SULBACTAM <=2 SENSITIVE Sensitive     PIP/TAZO <=4 SENSITIVE Sensitive     Extended ESBL NEGATIVE Sensitive     * >=100,000 COLONIES/mL ESCHERICHIA COLI  Blood culture (routine x 2)     Status: Abnormal   Collection Time: 02/25/18  1:53 AM  Result Value Ref Range Status   Specimen Description   Final    BLOOD LEFT ANTECUBITAL Performed at [facility], [place] [address], [place], [place] [address]    Special Requests   Final    BOTTLES DRAWN AEROBIC AND ANAEROBIC Blood Culture adequate volume Performed at [facility], [address] [place], [place] [address]    Culture  Setup Time   Final    GRAM NEGATIVE RODS IN BOTH AEROBIC AND ANAEROBIC BOTTLES CRITICAL VALUE NOTED.  VALUE IS CONSISTENT WITH PREVIOUSLY REPORTED AND CALLED VALUE.    Culture (A)  Final    ESCHERICHIA COLI SUSCEPTIBILITIES PERFORMED ON PREVIOUS CULTURE WITHIN THE LAST 5 DAYS. Performed at [facility], [place] [address], [place], [place] [address]    Report Status 02/27/2018 FINAL  Final  Culture, blood (routine x 2)     Status: None (Preliminary result)   Collection Time: 02/27/18  8:54 AM  Result Value Ref Range Status   Specimen Description   Final    BLOOD LEFT  ANTECUBITAL Performed at [facility] [address] [place], [place] [address]    Special Requests   Final    BOTTLES DRAWN AEROBIC AND ANAEROBIC Blood Culture adequate volume Performed at [facility]  [address] [place], [place] [address]    Culture   Final    NO GROWTH < 12 HOURS Performed at [facility] [address], [place], [place] [address]    Report Status PENDING  Incomplete  Culture, blood (routine x 2)     Status: None (Preliminary result)   Collection Time: 02/27/18  9:03 AM  Result Value Ref Range Status   Specimen Description   Final    BLOOD LEFT HAND Performed at [facility] [address] [place], [place] [address]    Special Requests   Final    BOTTLES DRAWN AEROBIC AND ANAEROBIC Blood Culture adequate volume Performed at [facility] [address], [place], [place] [address]    Culture   Final    NO GROWTH < 12 HOURS Performed at [facility] [address] [place], [place] [address]    Report Status PENDING  Incomplete     Labs: Basic Metabolic Panel: Recent Labs  Lab 02/25/18 0125 02/26/18 0541 02/27/18 0513 02/28/18 0509  NA 132* 137 139 136  K 3.2* 3.7 4.1 3.9  CL 98 105 108 100  CO2 20* 23 22 24   GLUCOSE 128* 119* 119* 112*  BUN 17 12 12 13   CREATININE 1.14 0.91 0.89 0.92  CALCIUM 8.9 8.5* 8.6* 8.9  MG  --  1.9  --   --    Liver Function Tests: Recent Labs  Lab 02/25/18 0125  AST 35  ALT 33  ALKPHOS 60  BILITOT 2.1*  PROT 6.8  ALBUMIN 4.0   Recent Labs  Lab 02/25/18 0125  LIPASE 23   No results for input(s): AMMONIA in the last 168 hours. CBC: Recent Labs  Lab 02/25/18 0125 02/26/18 0541 02/27/18 0513 02/28/18 0509  WBC 3.4* 7.5 6.9 7.0  NEUTROABS 3.3  --   --  4.9  HGB 14.3 13.4 13.3 13.7  HCT 42.3 41.0 39.5 41.3  MCV 94.2 96.9 95.6 95.2  PLT 71* 49* 60* 84*   Cardiac Enzymes: No results for input(s):  CKTOTAL, CKMB, CKMBINDEX, TROPONINI in the last 168 hours. BNP: BNP (last 3 results) No results for input(s): BNP in the last 8760 hours.  ProBNP (last 3 results) No results for input(s): PROBNP in the last 8760 hours.  CBG: Recent Labs  Lab 02/25/18 0050  GLUCAP 129*       Signed:  Alma [facility], MD [facility] 02/28/2018, 12:56 PM

## 2018-03-02 ENCOUNTER — Telehealth: Payer: Self-pay | Admitting: *Deleted

## 2018-03-02 NOTE — Telephone Encounter (Signed)
noted thanks  

## 2018-03-02 NOTE — Telephone Encounter (Signed)
Per chart review: Admit date: 02/25/2018 Discharge date: 02/28/2018  Time spent: 45 mins  Recommendations for Outpatient Follow-up:  1. PCP in 1 week  Discharge Diagnoses:      Active Hospital Problems   Diagnosis Date Noted  . UTI (urinary tract infection) 02/25/2018  . Sepsis due to Escherichia coli ([place]) 02/26/2018  . Acute pyelonephritis 02/26/2018  . Sepsis ([place]) 02/25/2018  . Hypokalemia 02/25/2018  . Thrombocytopenia ([place]) 05/21/2016  . Essential hypertension 12/09/2009  . Hyperlipidemia 03/07/2007  . Migraine 12/08/2006    Resolved Hospital Problems  No resolved problems to display.    Discharge Condition: Stable  Diet recommendation: Heart healthy ________________________________________________________________ Transition Care Management Follow-up Telephone Call   Date discharged? 02/28/18   How have you been since you were released from the hospital? "good" patient states that he does have some "uncomfortable" feeling when he urinates. Denies discharge, pain, difficulty urinating, nausea, fever or any other symptoms. States that he feels much better overall. I encouraged him to increase water intake. I also advised him to call our office immediately if he starts experiencing any new or worsening symptoms.    Do you understand why you were in the hospital? yes   Do you understand the discharge instructions? yes   Where were you discharged to? Home   Items Reviewed:  Medications reviewed: yes  Allergies reviewed: yes  Dietary changes reviewed: yes  Referrals reviewed: yes   Functional Questionnaire:   Activities of Daily Living (ADLs):   He states they are independent in the following: ambulation, bathing and hygiene, feeding, continence, grooming, toileting and dressing States they require assistance with the following: none   Any transportation issues/concerns?: no   Any patient concerns? no   Confirmed importance and date/time  of follow-up visits scheduled yes  Provider Appointment booked with Dr Yong Channel 03/06/18 1:30  Confirmed with patient if condition begins to worsen call PCP or go to the ER.  Patient was given the office number and encouraged to call back with question or concerns.  : yes

## 2018-03-04 LAB — CULTURE, BLOOD (ROUTINE X 2)
CULTURE: NO GROWTH
Culture: NO GROWTH
Special Requests: ADEQUATE
Special Requests: ADEQUATE

## 2018-03-06 ENCOUNTER — Encounter: Payer: Self-pay | Admitting: Family Medicine

## 2018-03-06 ENCOUNTER — Ambulatory Visit: Payer: BLUE CROSS/BLUE SHIELD | Admitting: Family Medicine

## 2018-03-06 VITALS — BP 114/80 | HR 67 | Temp 97.9°F | Ht 72.0 in | Wt 192.8 lb

## 2018-03-06 DIAGNOSIS — N3000 Acute cystitis without hematuria: Secondary | ICD-10-CM | POA: Diagnosis not present

## 2018-03-06 DIAGNOSIS — E782 Mixed hyperlipidemia: Secondary | ICD-10-CM

## 2018-03-06 DIAGNOSIS — G43809 Other migraine, not intractable, without status migrainosus: Secondary | ICD-10-CM

## 2018-03-06 DIAGNOSIS — I10 Essential (primary) hypertension: Secondary | ICD-10-CM | POA: Diagnosis not present

## 2018-03-06 DIAGNOSIS — Z87898 Personal history of other specified conditions: Secondary | ICD-10-CM

## 2018-03-06 LAB — POC URINALSYSI DIPSTICK (AUTOMATED)
BILIRUBIN UA: NEGATIVE
Blood, UA: NEGATIVE
Glucose, UA: NEGATIVE
KETONES UA: NEGATIVE
LEUKOCYTES UA: NEGATIVE
Nitrite, UA: NEGATIVE
PROTEIN UA: NEGATIVE
Urobilinogen, UA: 0.2 E.U./dL
pH, UA: 5.5 (ref 5.0–8.0)

## 2018-03-06 NOTE — Patient Instructions (Addendum)
Health Maintenance Due  Topic Date Due  . INFLUENZA VACCINE - schedule a nurse visit when you are feeling better 11/24/2017   Please stop by lab before you go  Thrilled you are doing so much better!  Please see [place] back ASAP for fever, nausea, vomiting, any urinary symptoms

## 2018-03-06 NOTE — Assessment & Plan Note (Signed)
03/06/18- Hospitalized after UTI led to bacteremia (initial presentation thought to be viral gastroenteritis as patient with no urinary symptoms).  He is completing his 10-day course of ceftriaxone initially and now amoxicillin.  He has 1.5 days left.  Encouraged full completion of this course.  Patient agrees.

## 2018-03-06 NOTE — Assessment & Plan Note (Signed)
Stable.  Continue Vytorin. ?

## 2018-03-06 NOTE — Assessment & Plan Note (Addendum)
As noted UTI led to bacteremia/sepsis.  Follow-up blood cultures were negative.  Urinalysis today was reassuring with no signs of UTI.  Patient without history of BPH- if  he had recurrence of UTI- would refer to urology

## 2018-03-06 NOTE — Assessment & Plan Note (Signed)
No recent migraines.  Stable. Continue as needed Imitrex

## 2018-03-06 NOTE — Assessment & Plan Note (Signed)
Stable on Bisoprolol-hctz 10-6.25 mg, lisinopril 5mg .  Continue current medication

## 2018-03-06 NOTE — Progress Notes (Signed)
Subjective:  Frank Joseph. is a 62 y.o. year old very pleasant male patient who presents for transitional care management and hospital follow up for sepsis due to E. Coli UTI leading to bacteremia. Patient was hospitalized from 02-25-18 to 02/28/2018. A TCM phone call was completed on 03/02/2018. Medical complexity moderate  Patient is a 62 year old male with medical history of hypertension, hyperlipidemia, migraines who presented with nausea, vomiting, fever and chills and ultimately found to have E. Coli related bacteremia with UTI source.  I had seen him prior to hospitalization and we thought patient had a viral gastroenteritis due to the nausea, vomiting and fever.  He did not have any urinary symptoms at that time.  He was actually improving at the time of the visit but later that evening and the next morning was having trouble keeping fluids down despite Zofran he had been given.  At home before going to hospital, He had multiple episodes of emesis with no blood or bile.   Patient was found to have a concerning urinalysis with moderate amount of leukocytes, many bacteria and white blood cells over 50,000.  Had a slightly low white count at 3.4 thousand as well as elevated lactic acid.  He was found to be febrile to 100.7 (he states 103 was peak), tachycardic and mildly tachypneic. He had had some mild abdominal pain but that improved during hospitalization.  He had a CT with renal stone protocol which did not show kidney stone or hydronephrosis. He reflects back to before being hospitalized and notes perhaps had mildest of confusion.   I personally reviewed his CT scan from 02/25/2018.  No acute intra-abdominal abnormality and also no pelvic abnormality.  No signs of nephrolithiasis or hydronephrosis.  Mild fatty liver.  Appendix was normal. I did not note the underdistention in the mid transverse colon on my initial read that radiology read.  Please note patient did have recent colonoscopy which was  reassuring.  Patient met sepsis criteria-found to have E. coli bacteremia.  Urine culture grew pansensitive E. coli.  Patient was initially treated with ceftriaxone 2 g daily and was converted to p.o. amoxicillin for twice daily use for 10 days total of therapy.  Repeat blood cultures 2 days after initials were obtained were negative for any bacteria or and specifically E. Coli.   For his hypetension- Home blood pressure was controlled on bisoprolol and hydrochlorothiazide as well as lisinopril.  Hyperlipidemia-patient was continued on Vytorin  Thrombocytopenia-was stable.  Chronic dating back to 2008.  No worsening during hospitalization.  Did require repletion of potassium due to mild hypokalemia.  Migraines-none in the hospital-patient had sumatriptan available as needed.    See problem oriented charting as well ROS-patient denies fever, chills since being home.  2 days ago he noted some slight dysuria but none since that time.  No abdominal pain reported.  Past Medical History-  Patient Active Problem List   Diagnosis Date Noted  . History of bacteremia 02/26/2018    Priority: High  . UTI (urinary tract infection) 02/25/2018    Priority: High  . History of adenomatous polyp of colon 08/15/2017    Priority: Medium  . Essential hypertension 12/09/2009    Priority: Medium  . Hyperlipidemia 03/07/2007    Priority: Medium  . Migraine 12/08/2006    Priority: Medium  . Hyperglycemia 05/21/2016    Priority: Low  . Thrombocytopenia ([place]) 05/21/2016    Priority: Low  . Allergic rhinitis 01/04/2014    Priority: Low  .  Anxiety state, unspecified 05/25/2010    Priority: Low    Medications- reviewed and updated  A medical reconciliation was performed comparing current medicines to hospital discharge medications. Current Outpatient Medications  Medication Sig Dispense Refill  . amoxicillin (AMOXIL) 875 MG tablet Take 1 tablet (875 mg total) by mouth 2 (two) times daily for 7 days.  14 tablet 0  . aspirin 81 MG tablet Take 81 mg by mouth daily.    . bisoprolol-hydrochlorothiazide (ZIAC) 10-6.25 MG tablet TAKE 1 TABLET BY MOUTH DAILY 90 tablet 1  . ezetimibe-simvastatin (VYTORIN) 10-20 MG tablet TAKE 1 TABLET BY MOUTH AT BEDTIME 90 tablet 0  . lisinopril (PRINIVIL,ZESTRIL) 5 MG tablet TAKE 1 TABLET(5 MG) BY MOUTH DAILY (Patient taking differently: Take 5 mg by mouth daily. TAKE 1 TABLET(5 MG) BY MOUTH DAILY) 90 tablet 1  . multivitamin (THERAGRAN) per tablet Take 1 tablet by mouth daily.      . ondansetron (ZOFRAN-ODT) 8 MG disintegrating tablet Take 1 tablet (8 mg total) by mouth every 8 (eight) hours as needed for nausea or vomiting. 20 tablet 0  . SUMAtriptan (IMITREX) 25 MG tablet Take 1 tablet (25 mg total) by mouth every 2 (two) hours as needed. 10 tablet 6  . triamcinolone (NASACORT) 55 MCG/ACT AERO nasal inhaler Place 2 sprays into the nose daily.    . vitamin B-12 (CYANOCOBALAMIN) 1000 MCG tablet Take 5,000 mcg by mouth daily.     No current facility-administered medications for this visit.     Objective: BP 114/80 (BP Location: Left Arm, Patient Position: Sitting, Cuff Size: Large)   Pulse 67   Temp 97.9 F (36.6 C) (Oral)   Ht 6' (1.829 m)   Wt 192 lb 12.8 oz (87.5 kg)   SpO2 98%   BMI 26.15 kg/m  Gen: NAD, resting comfortably, very well-appearing CV: RRR no murmurs rubs or gallops Lungs: CTAB no crackles, wheeze, rhonchi Abdomen: soft/nontender/nondistended/normal bowel sounds. No rebound or guarding.  Ext: no edema Skin: warm, dry Neuro: Speech normal, moves all extremities  Assessment/Plan:  History of bacteremia 03/06/18- Hospitalized after UTI led to bacteremia (initial presentation thought to be viral gastroenteritis as patient with no urinary symptoms).  He is completing his 10-day course of ceftriaxone initially and now amoxicillin.  He has 1.5 days left.  Encouraged full completion of this course.  Patient agrees.  Hyperlipidemia Stable.   Continue Vytorin  Essential hypertension Stable on Bisoprolol-hctz 10-6.25 mg, lisinopril 54m.  Continue current medication  Migraine No recent migraines.  Stable. Continue as needed Imitrex  UTI (urinary tract infection) As noted UTI led to bacteremia/sepsis.  Follow-up blood cultures were negative.  Urinalysis today was reassuring with no signs of UTI.  Patient without history of BPH- if  he had recurrence of UTI- would refer to urology  Future Appointments  Date Time Provider [place] 05/29/2018  8:20 AM HMarin Olp MD [facility]  4-649-monthollow-up advised.  Sooner if needed  Lab/Order associations: Acute cystitis without hematuria - Plan: POCT Urinalysis Dipstick (Automated)  History of bacteremia  Mixed hyperlipidemia  Essential hypertension  Other migraine without status migrainosus, not intractable  Return precautions advised.  StGarret ReddishMD

## 2018-03-13 ENCOUNTER — Encounter: Payer: Self-pay | Admitting: Family Medicine

## 2018-03-20 ENCOUNTER — Ambulatory Visit: Payer: Self-pay | Admitting: *Deleted

## 2018-03-20 NOTE — Telephone Encounter (Signed)
Pt calling to report 2 migraines in past  week; last one yesterday. States "I just want Dr. Yong Channel to know since I was hospitalized 02/25/18 to make sure everything is ok and not related to hospitalization."  States migraines "Same as always."  3-4/10, relieved within 15 minutes of taking Imitrex. Denies nausea, vomiting. States visual field cut, blurred vision is "Part of my aura, not unusual."  Also reports mild stiff neck "Since being in hospital with the uncomfortable bed." States mostly resolved with ice/ heat. "Played golf yesterday."  Pt states "Everything is going so well, I feel really good, I just want Dr. Yong Channel to know of the migraines."  Also states he went to pick up Imitrex refill and it was generic form. Questioning if this is OK for him to take. Please advise: [phone]  Reason for Disposition . Similar to previously diagnosed migraine headaches  Answer Assessment - Initial Assessment Questions 1. LOCATION: "Where does it hurt?"      No different from other migraines, just want Dr. Yong Channel to know since hospitalized. 2. ONSET: "When did the headache start?" (Minutes, hours or days)      2 in past week 3. PATTERN: "Does the pain come and go, or has it been constant since it started?"     Imitrex helped within 15 minutes 4. SEVERITY: "How bad is the pain?" and "What does it keep you from doing?"  (e.g., Scale 1-10; mild, moderate, or severe)   - MILD (1-3): doesn't interfere with normal activities    - MODERATE (4-7): interferes with normal activities or awakens from sleep    - SEVERE (8-10): excruciating pain, unable to do any normal activities        3-4/10 5. RECURRENT SYMPTOM: "Have you ever had headaches before?" If so, ask: "When was the last time?" and "What happened that time?"      Yes since 1970s 6. CAUSE: "What do you think is causing the headache?"     Migraine 7. MIGRAINE: "Have you been diagnosed with migraine headaches?" If so, ask: "Is this headache similar?"     "They are migraines" 8. HEAD INJURY: "Has there been any recent injury to the head?"      no 9. OTHER SYMPTOMS: "Do you have any other symptoms?" (fever, stiff neck, eye pain, sore throat, cold symptoms)     "Slight stiff neck, started when in hospital from bed" "Getting better with ica and heat, played golf yesterday."  Protocols used: HEADACHE-A-AH

## 2018-03-20 NOTE — Telephone Encounter (Signed)
The generic is fine as long as it is effective for him.  I am happy to see him for a visit if he would like- since this is typical of his migraine pattern I am also okay only following up if worsening pattern

## 2018-03-21 NOTE — Telephone Encounter (Signed)
Called and spoke to patient who states he feels that he doesn't need to be seen. He just wants his PCP to be aware that he had the 2 migraines that close together. I advised him that if he feels like he wants to be seen to call our office and we will be happy to see him. He verbalized understanding.

## 2018-03-22 ENCOUNTER — Encounter: Payer: Self-pay | Admitting: Family Medicine

## 2018-03-22 ENCOUNTER — Ambulatory Visit: Payer: BLUE CROSS/BLUE SHIELD | Admitting: Family Medicine

## 2018-03-22 VITALS — BP 118/74 | HR 59 | Temp 97.8°F | Ht 72.0 in | Wt 197.8 lb

## 2018-03-22 DIAGNOSIS — G43809 Other migraine, not intractable, without status migrainosus: Secondary | ICD-10-CM

## 2018-03-22 DIAGNOSIS — J3089 Other allergic rhinitis: Secondary | ICD-10-CM

## 2018-03-22 DIAGNOSIS — I10 Essential (primary) hypertension: Secondary | ICD-10-CM

## 2018-03-22 MED ORDER — SUMATRIPTAN SUCCINATE 100 MG PO TABS: 100.0000 mg | ORAL_TABLET | ORAL | 0 refills | Status: DC | PRN

## 2018-03-22 MED ORDER — SUMATRIPTAN SUCCINATE 100 MG PO TABS: 100.0000 mg | ORAL_TABLET | ORAL | 5 refills | Status: DC | PRN

## 2018-03-22 NOTE — Patient Instructions (Signed)
Health Maintenance Due  Topic Date Due  . INFLUENZA VACCINE - today? 11/24/2017   Neurological exam reassuring today.  Happy to see back if you continue to have more frequent migraines but I am hoping these level back out  Make sure to remain well-hydrated  Try nasal saline spray before bed to see if that helps with blood in nares

## 2018-03-22 NOTE — Progress Notes (Signed)
Subjective:  Frank Joseph. is a 62 y.o. year old very pleasant male patient who presents for/with See problem oriented charting ROS-no fever or chills, no nausea or vomiting.  Has had headaches.  Noted aura with headaches- no different from typical  Past Medical History-  Patient Active Problem List   Diagnosis Date Noted  . History of bacteremia 02/26/2018    Priority: High  . UTI (urinary tract infection) 02/25/2018    Priority: High  . History of adenomatous polyp of colon 08/15/2017    Priority: Medium  . Essential hypertension 12/09/2009    Priority: Medium  . Hyperlipidemia 03/07/2007    Priority: Medium  . Migraine 12/08/2006    Priority: Medium  . Hyperglycemia 05/21/2016    Priority: Low  . Thrombocytopenia ([place]) 05/21/2016    Priority: Low  . Allergic rhinitis 01/04/2014    Priority: Low  . Anxiety state, unspecified 05/25/2010    Priority: Low    Medications- reviewed and updated Current Outpatient Medications  Medication Sig Dispense Refill  . aspirin 81 MG tablet Take 81 mg by mouth daily.    . bisoprolol-hydrochlorothiazide (ZIAC) 10-6.25 MG tablet TAKE 1 TABLET BY MOUTH DAILY 90 tablet 1  . ezetimibe-simvastatin (VYTORIN) 10-20 MG tablet TAKE 1 TABLET BY MOUTH AT BEDTIME 90 tablet 0  . lisinopril (PRINIVIL,ZESTRIL) 5 MG tablet TAKE 1 TABLET(5 MG) BY MOUTH DAILY (Patient taking differently: Take 5 mg by mouth daily. TAKE 1 TABLET(5 MG) BY MOUTH DAILY) 90 tablet 1  . multivitamin (THERAGRAN) per tablet Take 1 tablet by mouth daily.      . ondansetron (ZOFRAN-ODT) 8 MG disintegrating tablet Take 1 tablet (8 mg total) by mouth every 8 (eight) hours as needed for nausea or vomiting. 20 tablet 0  . SUMAtriptan (IMITREX) 25 MG tablet Take 1 tablet (25 mg total) by mouth every 2 (two) hours as needed. 10 tablet 6  . triamcinolone (NASACORT) 55 MCG/ACT AERO nasal inhaler Place 2 sprays into the nose daily.    . vitamin B-12 (CYANOCOBALAMIN) 1000 MCG tablet Take  5,000 mcg by mouth daily.     No current facility-administered medications for this visit.     Objective: BP 118/74 (BP Location: Left Arm, Patient Position: Sitting, Cuff Size: Normal)   Pulse (!) 59   Temp 97.8 F (36.6 C) (Oral)   Ht 6' (1.829 m)   Wt 197 lb 12.8 oz (89.7 kg)   SpO2 97%   BMI 26.83 kg/m  Gen: NAD, resting comfortably Nasal turbinates mildly erythematous- in left nare note some dried blood.  Oropharynx largely normal. CV: RRR no murmurs rubs or gallops Lungs: CTAB no crackles, wheeze, rhonchi Abdomen: soft/nontender/nondistended Ext: no edema Skin: warm, dry Neuro: CN II-XII intact, sensation and reflexes normal throughout, 5/5 muscle strength in bilateral upper and lower extremities. Normal finger to nose. Normal rapid alternating movements. No pronator drift. Normal romberg. Normal gait.   Assessment/Plan:  Migraine S: migraine last Thursday and then again on Sunday- no clear trigger. imitrex was effective within 15 minutes. Typically would only have about 3 a year so frequency concerned him. Migraines were located on right side over ears then typical mild headache afterwards in right side of head over ear as well. Still getting auras  Also had slight crick in neck since hospital- heat and ice helping and this is improving.  A/P: Patient needed reassurance given back to back migrans within a week-neurological exam is reassuring today.  We did discover his prior sumatriptan  and was actually 100 mg so we sent in a new prescription for 100 mg instead of 25 mg   Allergic rhinitis S: when he got out of the hospital was having dry sinuses after being in hospital room. Every now in morning- will note some dried blood. No active nosebleed. Very mild lightheadedness at times.  No head trauma.  A/P: Largely stable allergies-we discussed Nasacort can increase nasal bleeding risk.  He is also feeling really dry at night-advised using nasal saline before bed.  For now may  continue Nasacort.  Has not tolerated antihistamines due to side effects listed in overview  Essential hypertension S: controlled on lisinopril 5 mg, bisoprolol-hydrochlorothiazide 10-6.25 mg.  Home blood pressures running around 120/80 BP Readings from Last 3 Encounters:  03/22/18 118/74  03/06/18 114/80  02/28/18 (!) 141/94  A/P: We discussed blood pressure goal of <140/90. Continue current meds: We did consider reducing lisinopril back down to 2.5 mg but opted to hold off in the end-he is going to push fluids instead    Future Appointments  Date Time Provider [place]  05/29/2018  8:20 AM Marin Olp, MD [facility]    Meds ordered this encounter  Medications  . DISCONTD: SUMAtriptan (IMITREX) 100 MG tablet    Sig: Take 1 tablet (100 mg total) by mouth every 2 (two) hours as needed for migraine. May repeat in 2 hours if headache persists or recurs.    Dispense:  10 tablet    Refill:  0  . SUMAtriptan (IMITREX) 100 MG tablet    Sig: Take 1 tablet (100 mg total) by mouth every 2 (two) hours as needed for migraine. May repeat in 2 hours if headache persists or recurs.    Dispense:  10 tablet    Refill:  5    Dose increase from 25mg     Return precautions advised.  [name], MD

## 2018-03-23 NOTE — Assessment & Plan Note (Signed)
S: migraine last Thursday and then again on Sunday- no clear trigger. imitrex was effective within 15 minutes. Typically would only have about 3 a year so frequency concerned him. Migraines were located on right side over ears then typical mild headache afterwards in right side of head over ear as well. Still getting auras  Also had slight crick in neck since hospital- heat and ice helping and this is improving.  A/P: Patient needed reassurance given back to back migrans within a week-neurological exam is reassuring today.  We did discover his prior sumatriptan and was actually 100 mg so we sent in a new prescription for 100 mg instead of 25 mg

## 2018-03-23 NOTE — Assessment & Plan Note (Signed)
S: when he got out of the hospital was having dry sinuses after being in hospital room. Every now in morning- will note some dried blood. No active nosebleed. Very mild lightheadedness at times.  No head trauma.  A/P: Largely stable allergies-we discussed Nasacort can increase nasal bleeding risk.  He is also feeling really dry at night-advised using nasal saline before bed.  For now may continue Nasacort.  Has not tolerated antihistamines due to side effects listed in overview

## 2018-03-23 NOTE — Assessment & Plan Note (Signed)
S: controlled on lisinopril 5 mg, bisoprolol-hydrochlorothiazide 10-6.25 mg.  Home blood pressures running around 120/80 BP Readings from Last 3 Encounters:  03/22/18 118/74  03/06/18 114/80  02/28/18 (!) 141/94  A/P: We discussed blood pressure goal of <140/90. Continue current meds: We did consider reducing lisinopril back down to 2.5 mg but opted to hold off in the end-he is going to push fluids instead

## 2018-04-04 ENCOUNTER — Ambulatory Visit (INDEPENDENT_AMBULATORY_CARE_PROVIDER_SITE_OTHER): Payer: BLUE CROSS/BLUE SHIELD

## 2018-04-04 DIAGNOSIS — Z23 Encounter for immunization: Secondary | ICD-10-CM

## 2018-05-11 DIAGNOSIS — S0502XA Injury of conjunctiva and corneal abrasion without foreign body, left eye, initial encounter: Secondary | ICD-10-CM | POA: Diagnosis not present

## 2018-05-14 ENCOUNTER — Other Ambulatory Visit: Payer: Self-pay | Admitting: Family Medicine

## 2018-05-19 DIAGNOSIS — S0502XD Injury of conjunctiva and corneal abrasion without foreign body, left eye, subsequent encounter: Secondary | ICD-10-CM | POA: Diagnosis not present

## 2018-05-29 ENCOUNTER — Encounter: Payer: Self-pay | Admitting: Family Medicine

## 2018-05-29 ENCOUNTER — Ambulatory Visit (INDEPENDENT_AMBULATORY_CARE_PROVIDER_SITE_OTHER): Payer: BLUE CROSS/BLUE SHIELD | Admitting: Family Medicine

## 2018-05-29 VITALS — BP 128/88 | HR 62 | Temp 97.7°F | Ht 72.0 in | Wt 197.8 lb

## 2018-05-29 DIAGNOSIS — D696 Thrombocytopenia, unspecified: Secondary | ICD-10-CM

## 2018-05-29 DIAGNOSIS — E782 Mixed hyperlipidemia: Secondary | ICD-10-CM

## 2018-05-29 DIAGNOSIS — Z125 Encounter for screening for malignant neoplasm of prostate: Secondary | ICD-10-CM

## 2018-05-29 DIAGNOSIS — Z6826 Body mass index (BMI) 26.0-26.9, adult: Secondary | ICD-10-CM

## 2018-05-29 DIAGNOSIS — I10 Essential (primary) hypertension: Secondary | ICD-10-CM

## 2018-05-29 DIAGNOSIS — Z23 Encounter for immunization: Secondary | ICD-10-CM

## 2018-05-29 DIAGNOSIS — Z0000 Encounter for general adult medical examination without abnormal findings: Secondary | ICD-10-CM

## 2018-05-29 LAB — LIPID PANEL
Cholesterol: 140 mg/dL (ref 0–200)
HDL: 39.1 mg/dL (ref >39.00)
NonHDL: 100.96
Total CHOL/HDL Ratio: 4
Triglycerides: 232 mg/dL — ABNORMAL HIGH (ref 0.0–149.0)
VLDL: 46.4 mg/dL — ABNORMAL HIGH (ref 0.0–40.0)

## 2018-05-29 LAB — COMPREHENSIVE METABOLIC PANEL
ALK PHOS: 56 U/L (ref 39–117)
ALT: 28 U/L (ref 0–53)
AST: 23 U/L (ref 0–37)
Albumin: 4.7 g/dL (ref 3.5–5.2)
BUN: 15 mg/dL (ref 6–23)
CALCIUM: 9.7 mg/dL (ref 8.4–10.5)
CO2: 28 mEq/L (ref 19–32)
Chloride: 101 mEq/L (ref 96–112)
Creatinine, Ser: 1.06 mg/dL (ref 0.40–1.50)
GFR: 70.51 mL/min (ref >60.00)
Glucose, Bld: 108 mg/dL — ABNORMAL HIGH (ref 70–99)
Potassium: 4 mEq/L (ref 3.5–5.1)
Sodium: 138 mEq/L (ref 135–145)
Total Bilirubin: 1.1 mg/dL (ref 0.2–1.2)
Total Protein: 7.1 g/dL (ref 6.0–8.3)

## 2018-05-29 LAB — URINALYSIS
Bilirubin Urine: NEGATIVE
Hgb urine dipstick: NEGATIVE
Ketones, ur: NEGATIVE
Leukocytes, UA: NEGATIVE
Nitrite: NEGATIVE
Specific Gravity, Urine: 1.025 (ref 1.000–1.030)
Total Protein, Urine: NEGATIVE
Urine Glucose: NEGATIVE
Urobilinogen, UA: 0.2 (ref 0.0–1.0)
pH: 5.5 (ref 5.0–8.0)

## 2018-05-29 LAB — CBC
HCT: 48 % (ref 39.0–52.0)
Hemoglobin: 16.5 g/dL (ref 13.0–17.0)
MCHC: 34.3 g/dL (ref 30.0–36.0)
MCV: 93.8 fl (ref 78.0–100.0)
Platelets: 124 10*3/uL — ABNORMAL LOW (ref 150.0–400.0)
RBC: 5.12 Mil/uL (ref 4.22–5.81)
RDW: 12.7 % (ref 11.5–15.5)
WBC: 4.1 10*3/uL (ref 4.0–10.5)

## 2018-05-29 LAB — LDL CHOLESTEROL, DIRECT: LDL DIRECT: 55 mg/dL

## 2018-05-29 LAB — PSA: PSA: 2.01 ng/mL (ref 0.10–4.00)

## 2018-05-29 NOTE — Addendum Note (Signed)
Addended by: Lyndle Herrlich on: 05/29/2018 09:02 AM   Modules accepted: Orders

## 2018-05-29 NOTE — Patient Instructions (Addendum)
You set a goal to drink more water, cut alcohol down to 14 per week maximum. You are still aiming for 190 on our scales- not far off at 197. Great job with exercise!   Shingrix #1 today. Repeat injection in 2-5 months. Schedule a nurse visit for the 2nd injection before you leave today (at the check out desk)  Please stop by lab before you go If you do not have mychart- we will call you about results within 5 business days of [place] receiving them.  If you have mychart- we will send your results within 3 business days of [place] receiving them.  If abnormal or we want to clarify a result, we will call or mychart you to make sure you receive the message.  If you have questions or concerns or don't hear within 5-7 days, please send [place] a message or call us.

## 2018-05-29 NOTE — Addendum Note (Signed)
Addended by: Kayren Eaves T on: 05/29/2018 09:00 AM   Modules accepted: Orders

## 2018-05-29 NOTE — Progress Notes (Signed)
Phone: [phone]   Subjective:  Patient presents today for their annual physical. Chief complaint-noted.   See problem oriented charting- ROS- full  review of systems was completed and negative except for: intermittent clear runny nose, light sensitivity, muscle aches  BMI monitoring- elevated BMI noted: Body mass index is 26.83 kg/m. Encouraged need for healthy eating, regular exercise, weight loss.     BMI Metric Follow Up - 05/29/18 0832      BMI Metric Follow Up-Please document annually   BMI Metric Follow Up  Education provided       The following were reviewed and entered/updated in epic: Past Medical History:  Diagnosis Date  . Allergy   . Headache(784.0)   . Hiatal hernia   . Hyperlipidemia   . Hypertension   . Sinusitis    Patient Active Problem List   Diagnosis Date Noted  . History of bacteremia 02/26/2018    Priority: High  . UTI (urinary tract infection) 02/25/2018    Priority: High  . History of adenomatous polyp of colon 08/15/2017    Priority: Medium  . Essential hypertension 12/09/2009    Priority: Medium  . Hyperlipidemia 03/07/2007    Priority: Medium  . Migraine 12/08/2006    Priority: Medium  . Hyperglycemia 05/21/2016    Priority: Low  . Thrombocytopenia ([place]) 05/21/2016    Priority: Low  . Allergic rhinitis 01/04/2014    Priority: Low  . Anxiety state, unspecified 05/25/2010    Priority: Low   Past Surgical History:  Procedure Laterality Date  . COLONOSCOPY     int hems only- with stark   . colonscopy    . WISDOM TOOTH EXTRACTION      Family History  Problem Relation Age of Onset  . Hypertension Mother   . Glaucoma Father   . Colon cancer Neg Hx   . Colon polyps Neg Hx   . Esophageal cancer Neg Hx   . Rectal cancer Neg Hx   . Stomach cancer Neg Hx     Medications- reviewed and updated Current Outpatient Medications  Medication Sig Dispense Refill  . aspirin 81 MG tablet Take 81 mg by mouth daily.    .  bisoprolol-hydrochlorothiazide (ZIAC) 10-6.25 MG tablet TAKE 1 TABLET BY MOUTH DAILY 90 tablet 1  . ezetimibe-simvastatin (VYTORIN) 10-20 MG tablet TAKE 1 TABLET BY MOUTH AT BEDTIME 90 tablet 2  . fluticasone (KLS ALLER-FLO) 50 MCG/ACT nasal spray Place 1 spray into both nostrils daily.    Marland Kitchen lisinopril (PRINIVIL,ZESTRIL) 5 MG tablet TAKE 1 TABLET(5 MG) BY MOUTH DAILY (Patient taking differently: Take 5 mg by mouth daily. TAKE 1 TABLET(5 MG) BY MOUTH DAILY) 90 tablet 1  . multivitamin (THERAGRAN) per tablet Take 1 tablet by mouth daily.      . ondansetron (ZOFRAN-ODT) 8 MG disintegrating tablet Take 1 tablet (8 mg total) by mouth every 8 (eight) hours as needed for nausea or vomiting. 20 tablet 0  . SUMAtriptan (IMITREX) 100 MG tablet Take 1 tablet (100 mg total) by mouth every 2 (two) hours as needed for migraine. May repeat in 2 hours if headache persists or recurs. 10 tablet 5  . vitamin B-12 (CYANOCOBALAMIN) 1000 MCG tablet Take 5,000 mcg by mouth daily.     No current facility-administered medications for this visit.     Allergies-reviewed and updated Allergies  Allergen Reactions  . Ambien [Zolpidem Tartrate]     Used during hospitalization- became delirious     Social History   Social History Narrative  Married 35 years in 2015. 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    Played baseball at [employer]. Wife went to [employer].       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 Oropharynx normal Neck: no thyromegaly CV: RRR no murmurs rubs or  gallops Lungs: CTAB no crackles, wheeze, rhonchi Abdomen: soft/nontender/nondistended/normal bowel sounds. No rebound or guarding.  Ext: no edema Skin: warm, dry Neuro: grossly normal, moves all extremities, PERRLA   Assessment and Plan  63 y.o. male presenting for annual physical.  Health Maintenance counseling: 1. Anticipatory guidance: Patient counseled regarding regular dental exams -q6 months, eye exams -yearly,  avoiding smoking and second hand smoke , limiting alcohol to 2 beverages per day - about 20 beers at top end- on low end 12.  We discussed limiting to 14 total per week 2. Risk factor reduction:  Advised patient of need for regular exercise and diet rich and fruits and vegetables to reduce risk of heart attack and stroke. Exercise- Drumming regularly, remains very active. Lifts weights everyday. 10k steps per day. Diet- enjoys eating- eats out a fair amount which he knows is a barrier to his goals though tries to eat well.  Patient was 203 last physical-his long-term goal has been 190 on our scales Wt Readings from Last 3 Encounters:  05/29/18 197 lb 12.8 oz (89.7 kg)  03/22/18 197 lb 12.8 oz (89.7 kg)  03/06/18 192 lb 12.8 oz (87.5 kg)  3. Immunizations/screenings/ancillary studies-discussed doing Shingrix today  Immunization History  Administered Date(s) Administered  . Influenza Split 04/23/2011  . Influenza Whole 01/03/2009  . Influenza,inj,Quad PF,6+ Mos 01/04/2014, 01/22/2015, 05/25/2016, 05/13/2017, 04/04/2018  . Td 04/26/2006  . Tdap 05/25/2016  4. Prostate cancer screening- low risk PSA trend in the past.  Nocturia study up once a night-sometimes none if cut fluids off early.  We will defer rectal exam unless concerning PSA trend Lab Results  Component Value Date   PSA 1.12 05/27/2017   PSA 0.97 05/14/2016   PSA 1.19 08/09/2014   5. Colon cancer screening -08/02/2017 with 5-year follow-up due to adenomatous colon polyp history  6. Skin cancer screening-no  dermatologist.  Advised regular sunscreen use. Denies worrisome, changing, or new skin lesions.  7.  Never smoker  Status of chronic or acute concerns     #Hypertension S: Compliant with lisinopril 5 mg, bisoprolol hydrochlorothiazide 10-6.25 mg.  Home blood pressure monitoring- 120s/80s  A/P: Stable. Continue current medications.     #Hyperlipidemia S: Compliant with Vytorin. gts some rare myalgias which wonders if related to statin. A/P: Stable. Continue current medications. If triglycerides under 150 and LDL under 60- consider every other day dosing    #Migraines S: Typical pattern is approximately 3 times a year.  Sumatriptan 100 mg is helpful. Last one about 3 weeks ago- sumatriptan helped.  A/P: Stable. Continue current medications.- hopefully sparing sumatriptan    #Allergic rhinitis S: Does not tolerate antihistamines due to side effects.  Uses Nasacort or other generic similar.  Has had some nosebleeds in the past A/P: Stable. Continue current medications.    #Thrombocytopenia- has had thrombocytopenia for years.  We will trend this today.  % History of bacteremia-UTI led to bacteremia in late 2019.  Patient hospitalized at the time.  No history of UTIs outside of this and presented with primarily nausea/vomiting.  BPH likely contributed    No problem-specific Assessment & Plan notes found for this encounter.   No future appointments. No follow-ups on file.  Lab/Order associations: FASTING  Preventative health care  Essential hypertension - Plan: CBC, Comprehensive metabolic panel, Lipid panel, Urinalysis  Mixed hyperlipidemia - Plan: CBC, Comprehensive metabolic panel, Lipid panel, Urinalysis  Thrombocytopenia ([facility]) - Plan: CBC  Screening for prostate cancer - Plan: PSA  No orders of the defined types were placed in this encounter.   Return precautions advised.  [name], MD

## 2018-05-30 ENCOUNTER — Encounter: Payer: Self-pay | Admitting: Family Medicine

## 2018-07-03 ENCOUNTER — Other Ambulatory Visit: Payer: Self-pay | Admitting: Family Medicine

## 2018-07-04 DIAGNOSIS — H5213 Myopia, bilateral: Secondary | ICD-10-CM | POA: Diagnosis not present

## 2018-07-05 ENCOUNTER — Other Ambulatory Visit: Payer: Self-pay | Admitting: Family Medicine

## 2018-08-25 DIAGNOSIS — G43909 Migraine, unspecified, not intractable, without status migrainosus: Secondary | ICD-10-CM | POA: Diagnosis not present

## 2018-08-30 DIAGNOSIS — H43812 Vitreous degeneration, left eye: Secondary | ICD-10-CM | POA: Diagnosis not present

## 2018-09-26 ENCOUNTER — Ambulatory Visit: Payer: BLUE CROSS/BLUE SHIELD

## 2018-09-26 ENCOUNTER — Other Ambulatory Visit: Payer: Self-pay

## 2018-09-26 ENCOUNTER — Ambulatory Visit: Payer: BLUE CROSS/BLUE SHIELD | Admitting: Family Medicine

## 2018-09-26 DIAGNOSIS — Z23 Encounter for immunization: Secondary | ICD-10-CM

## 2018-09-26 NOTE — Progress Notes (Signed)
Per orders of Dr. Yong Channel, injection of Shingles given by Franco Collet. Patient tolerated injection well.  I have reviewed and agree with note, evaluation, plan.   [name], MD

## 2018-09-26 NOTE — Patient Instructions (Signed)
There are no preventive care reminders to display for this patient.  Depression screen [facility] 2/9 05/29/2018 05/13/2017 06/18/2013  Decreased Interest 0 0 0  Down, Depressed, Hopeless 0 0 0  PHQ - 2 Score 0 0 0  Altered sleeping 0 - -  Tired, decreased energy 0 - -  Change in appetite 0 - -  Feeling bad or failure about yourself  0 - -  Trouble concentrating 0 - -  Moving slowly or fidgety/restless 0 - -  Suicidal thoughts 0 - -  PHQ-9 Score 0 - -

## 2018-12-31 ENCOUNTER — Other Ambulatory Visit: Payer: Self-pay | Admitting: Family Medicine

## 2019-01-18 IMAGING — CT CT RENAL STONE PROTOCOL
2 of 4 series · 16 of 46 positions shown, 18 images · non-contrast
Comparison: None.

CLINICAL DATA: 61-year-old male with vomiting and flank pain.

EXAM:
CT ABDOMEN AND PELVIS WITHOUT CONTRAST
TECHNIQUE: Multidetector CT imaging of the abdomen and pelvis was performed
following the standard protocol without IV contrast.

[Series 2: axial st · axial · 0.76mm/px · z∈[-399,+51]mm · 13 of 100 slices shown, 15 images]
[im 5/100  soft-tissue]
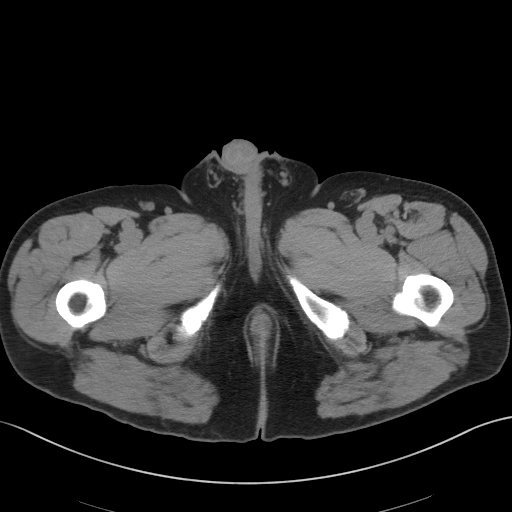
[im 5/100  bone]
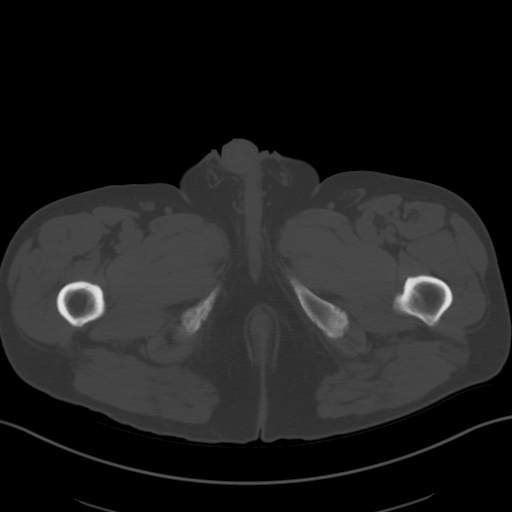
[im 13/100  soft-tissue]
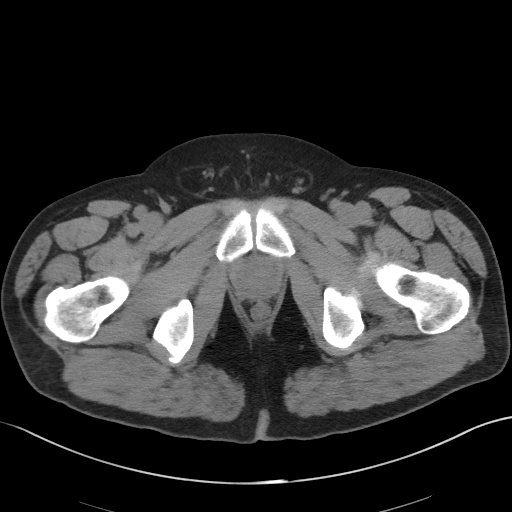
[im 21/100  soft-tissue]
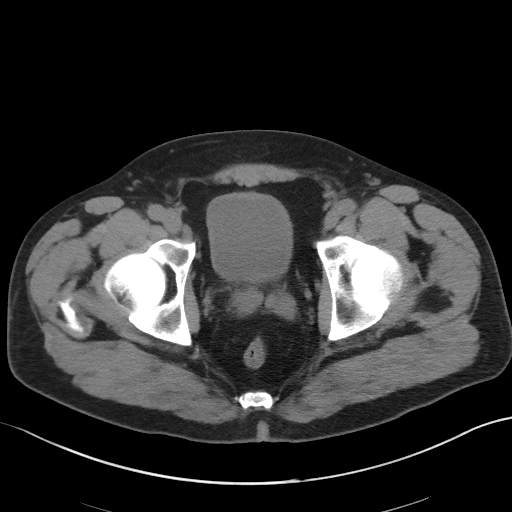
[im 29/100  soft-tissue]
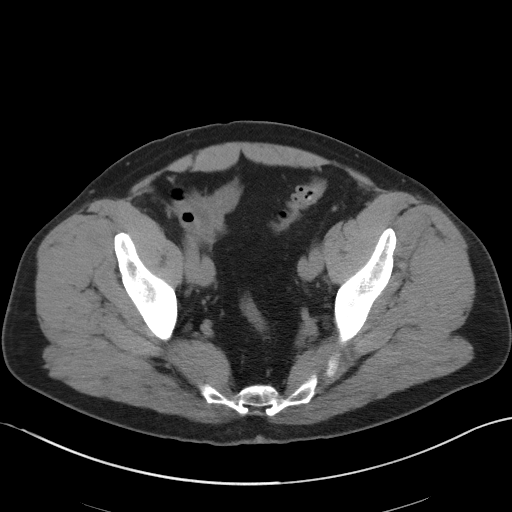
[im 34/100  soft-tissue]
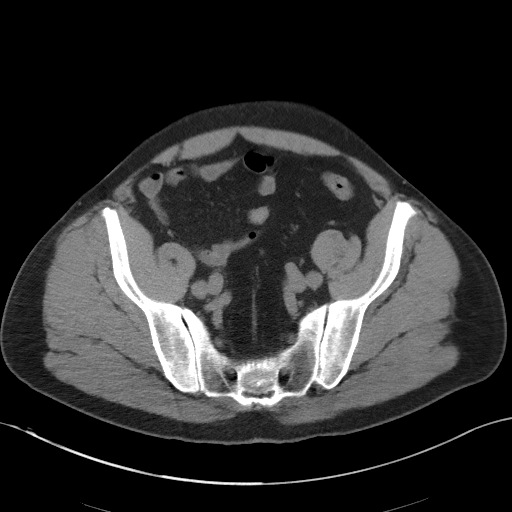
[im 42/100  soft-tissue]
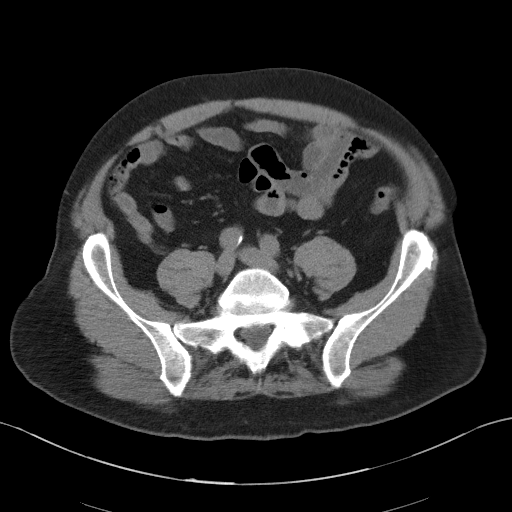
[im 50/100  soft-tissue]
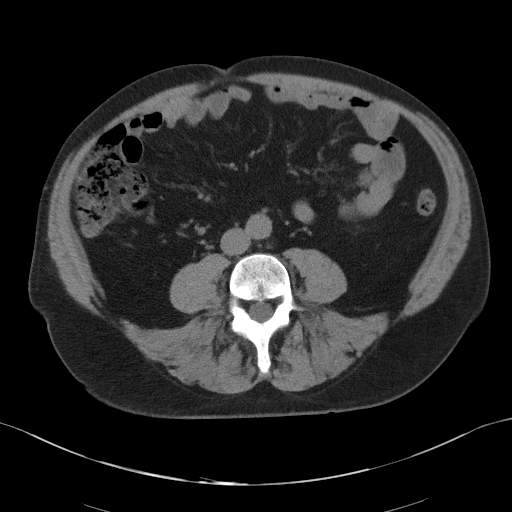
[im 58/100  soft-tissue]
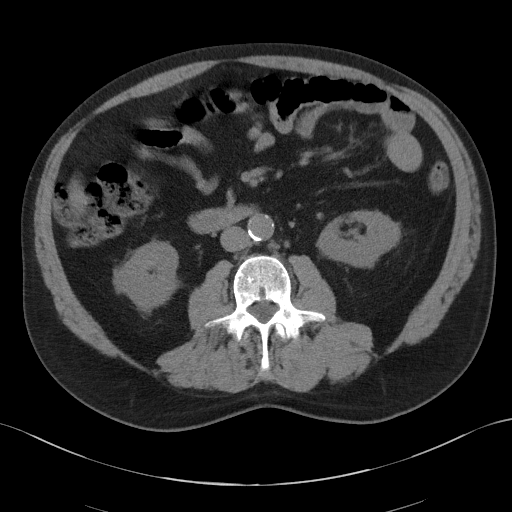
[im 67/100  soft-tissue]
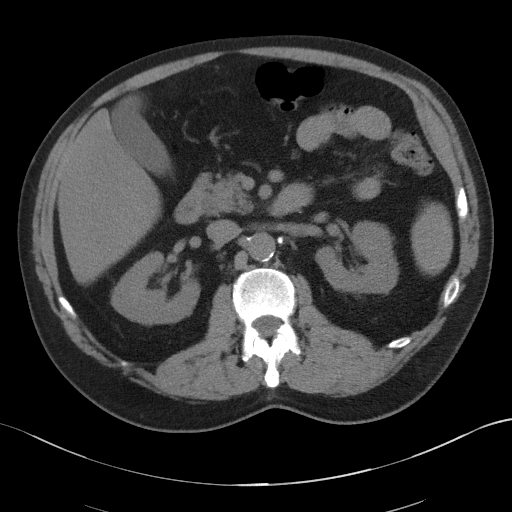
[im 67/100  bone]
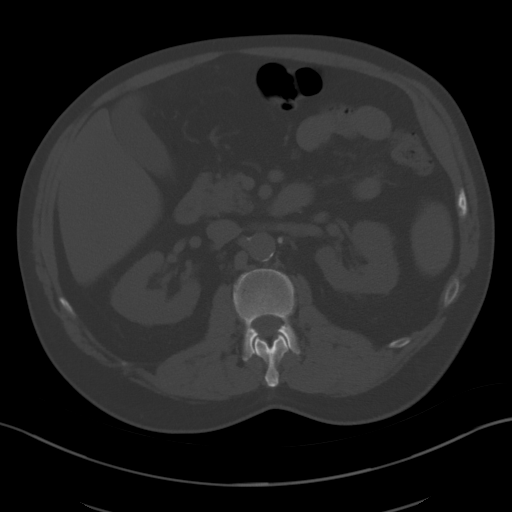
[im 71/100  soft-tissue]
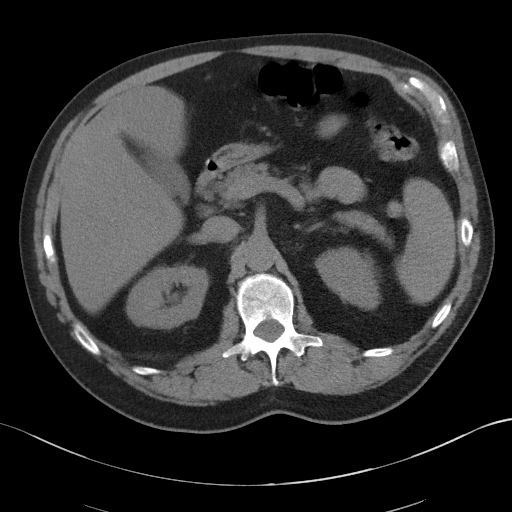
[im 79/100  soft-tissue]
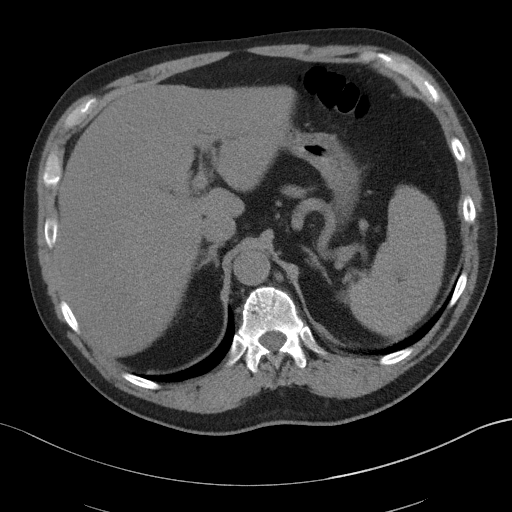
[im 87/100  soft-tissue]
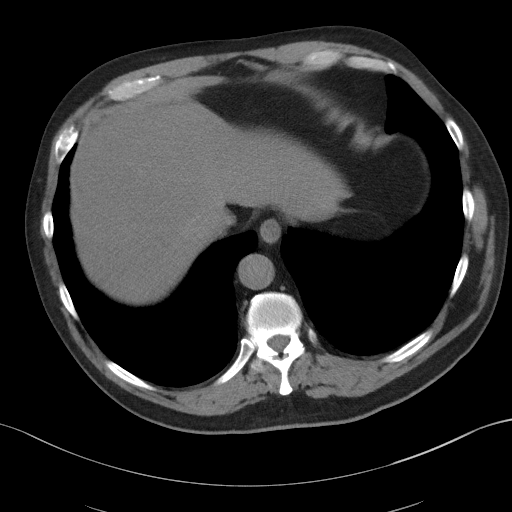
[im 95/100  soft-tissue]
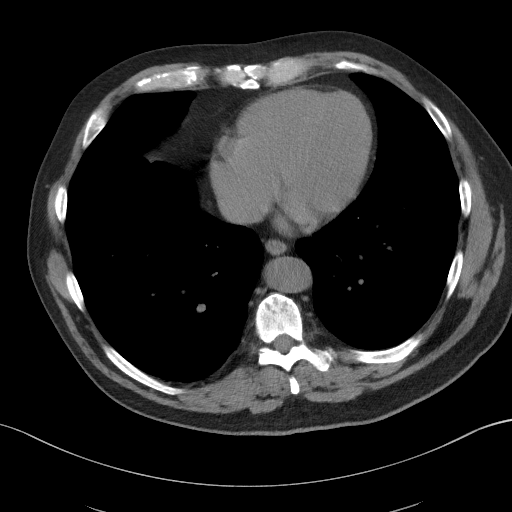

[Series 4: coronal st · coronal · 0.83mm/px · 3 of 108 slices shown]
[im 36/108  soft-tissue]
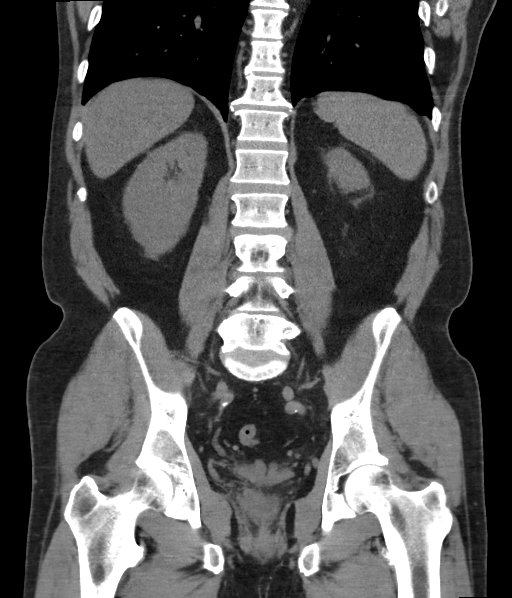
[im 48/108  soft-tissue]
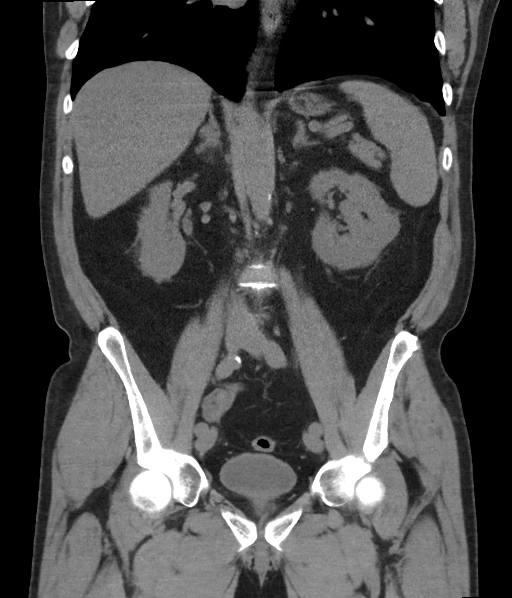
[im 60/108  soft-tissue]
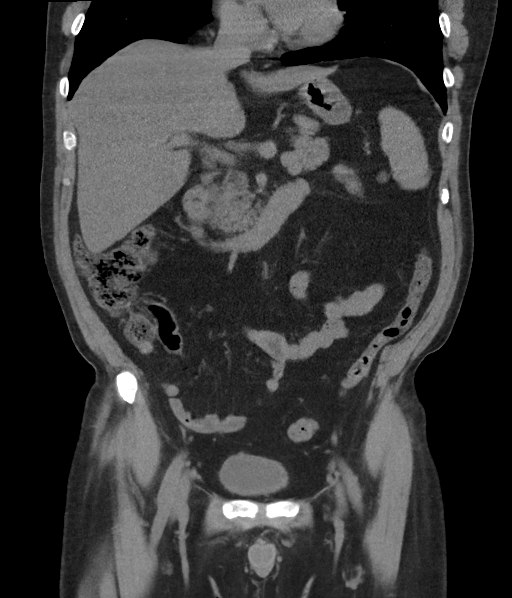

[16 of 46 positions shown; findings below may reference images not displayed]

FINDINGS: Evaluation of this exam is limited in the absence of intravenous
contrast.

Lower chest: The visualized lung bases are clear.

No intra-abdominal free air or free fluid.

Hepatobiliary: Diffuse fatty infiltration of the liver. No
intrahepatic biliary ductal dilatation. The gallbladder is
unremarkable.

Pancreas: Unremarkable. No pancreatic ductal dilatation or
surrounding inflammatory changes.

Spleen: Normal in size without focal abnormality.

Adrenals/Urinary Tract: There is a 12 mm indeterminate right adrenal
nodule. The left adrenal gland is unremarkable. There is no
hydronephrosis or nephrolithiasis on either side. The visualized
ureters and urinary bladder appear unremarkable.

Stomach/Bowel: There is no bowel obstruction or active inflammation.
There is a focal area of apparent thickening of the midportion of
the transverse colon, likely related to underdistention. A mass is
less likely but difficult to exclude in the absence of oral
contrast. The appendix is normal.

Vascular/Lymphatic: Mild aortoiliac atherosclerotic disease. No
portal venous gas. There is no adenopathy.

Reproductive: The prostate and seminal vesicles are grossly
unremarkable. No pelvic mass.

Other: None

Musculoskeletal: No acute or significant osseous findings.
IMPRESSION: 1. No acute intra-abdominal or pelvic pathology. No hydronephrosis
or nephrolithiasis.
2. Fatty liver.
3. No bowel obstruction or active inflammation. Normal appendix.
4. Focal area of underdistention in the mid transverse colon versus
less likely a mass. Correlation with colonoscopy findings
recommended.

## 2019-01-29 ENCOUNTER — Encounter: Payer: Self-pay | Admitting: Family Medicine

## 2019-02-05 ENCOUNTER — Other Ambulatory Visit: Payer: Self-pay | Admitting: Family Medicine

## 2019-02-20 ENCOUNTER — Encounter: Payer: Self-pay | Admitting: Family Medicine

## 2019-02-20 ENCOUNTER — Other Ambulatory Visit: Payer: Self-pay

## 2019-02-20 ENCOUNTER — Ambulatory Visit (INDEPENDENT_AMBULATORY_CARE_PROVIDER_SITE_OTHER): Payer: BC Managed Care – PPO

## 2019-02-20 DIAGNOSIS — Z23 Encounter for immunization: Secondary | ICD-10-CM | POA: Diagnosis not present

## 2019-05-30 ENCOUNTER — Other Ambulatory Visit: Payer: Self-pay

## 2019-05-31 ENCOUNTER — Encounter: Payer: Self-pay | Admitting: Family Medicine

## 2019-05-31 ENCOUNTER — Ambulatory Visit (INDEPENDENT_AMBULATORY_CARE_PROVIDER_SITE_OTHER): Payer: BC Managed Care – PPO | Admitting: Family Medicine

## 2019-05-31 VITALS — BP 150/84 | HR 63 | Temp 97.1°F | Ht 73.0 in | Wt 197.6 lb

## 2019-05-31 DIAGNOSIS — G43809 Other migraine, not intractable, without status migrainosus: Secondary | ICD-10-CM | POA: Diagnosis not present

## 2019-05-31 DIAGNOSIS — Z87898 Personal history of other specified conditions: Secondary | ICD-10-CM | POA: Diagnosis not present

## 2019-05-31 DIAGNOSIS — I10 Essential (primary) hypertension: Secondary | ICD-10-CM

## 2019-05-31 DIAGNOSIS — Z86010 Personal history of colonic polyps: Secondary | ICD-10-CM

## 2019-05-31 DIAGNOSIS — Z860101 Personal history of adenomatous and serrated colon polyps: Secondary | ICD-10-CM

## 2019-05-31 DIAGNOSIS — D696 Thrombocytopenia, unspecified: Secondary | ICD-10-CM

## 2019-05-31 DIAGNOSIS — Z0000 Encounter for general adult medical examination without abnormal findings: Secondary | ICD-10-CM | POA: Diagnosis not present

## 2019-05-31 DIAGNOSIS — E782 Mixed hyperlipidemia: Secondary | ICD-10-CM | POA: Diagnosis not present

## 2019-05-31 DIAGNOSIS — Z125 Encounter for screening for malignant neoplasm of prostate: Secondary | ICD-10-CM

## 2019-05-31 DIAGNOSIS — R739 Hyperglycemia, unspecified: Secondary | ICD-10-CM | POA: Diagnosis not present

## 2019-05-31 LAB — CBC WITH DIFFERENTIAL/PLATELET
Basophils Absolute: 0 10*3/uL (ref 0.0–0.1)
Basophils Relative: 0.8 % (ref 0.0–3.0)
Eosinophils Absolute: 0.1 10*3/uL (ref 0.0–0.7)
Eosinophils Relative: 1.4 % (ref 0.0–5.0)
HCT: 47.1 % (ref 39.0–52.0)
Hemoglobin: 16 g/dL (ref 13.0–17.0)
Lymphocytes Relative: 19.9 % (ref 12.0–46.0)
Lymphs Abs: 1.1 10*3/uL (ref 0.7–4.0)
MCHC: 34 g/dL (ref 30.0–36.0)
MCV: 95.6 fl (ref 78.0–100.0)
Monocytes Absolute: 0.5 10*3/uL (ref 0.1–1.0)
Monocytes Relative: 9.1 % (ref 3.0–12.0)
Neutro Abs: 3.9 10*3/uL (ref 1.4–7.7)
Neutrophils Relative %: 68.8 % (ref 43.0–77.0)
Platelets: 120 10*3/uL — ABNORMAL LOW (ref 150.0–400.0)
RBC: 4.93 Mil/uL (ref 4.22–5.81)
RDW: 12.7 % (ref 11.5–15.5)
WBC: 5.7 10*3/uL (ref 4.0–10.5)

## 2019-05-31 LAB — PSA: PSA: 0.97 ng/mL (ref 0.10–4.00)

## 2019-05-31 LAB — POC URINALSYSI DIPSTICK (AUTOMATED)
Bilirubin, UA: NEGATIVE
Blood, UA: NEGATIVE
Glucose, UA: NEGATIVE
Ketones, UA: NEGATIVE
Leukocytes, UA: NEGATIVE
Nitrite, UA: NEGATIVE
Protein, UA: NEGATIVE
Spec Grav, UA: >=1.03 — AB (ref 1.010–1.025)
Urobilinogen, UA: 0.2 E.U./dL
pH, UA: 5.5 (ref 5.0–8.0)

## 2019-05-31 LAB — COMPREHENSIVE METABOLIC PANEL
ALT: 36 U/L (ref 0–53)
AST: 29 U/L (ref 0–37)
Albumin: 4.7 g/dL (ref 3.5–5.2)
Alkaline Phosphatase: 60 U/L (ref 39–117)
BUN: 16 mg/dL (ref 6–23)
CO2: 28 mEq/L (ref 19–32)
Calcium: 9.6 mg/dL (ref 8.4–10.5)
Chloride: 100 mEq/L (ref 96–112)
Creatinine, Ser: 1.02 mg/dL (ref 0.40–1.50)
GFR: 73.48 mL/min (ref >60.00)
Glucose, Bld: 104 mg/dL — ABNORMAL HIGH (ref 70–99)
Potassium: 4.1 mEq/L (ref 3.5–5.1)
Sodium: 137 mEq/L (ref 135–145)
Total Bilirubin: 1.1 mg/dL (ref 0.2–1.2)
Total Protein: 7.1 g/dL (ref 6.0–8.3)

## 2019-05-31 LAB — HEMOGLOBIN A1C: Hgb A1c MFr Bld: 5.4 % (ref 4.6–6.5)

## 2019-05-31 LAB — LIPID PANEL
Cholesterol: 155 mg/dL (ref 0–200)
HDL: 43.3 mg/dL (ref >39.00)
NonHDL: 111.38
Total CHOL/HDL Ratio: 4
Triglycerides: 325 mg/dL — ABNORMAL HIGH (ref 0.0–149.0)
VLDL: 65 mg/dL — ABNORMAL HIGH (ref 0.0–40.0)

## 2019-05-31 LAB — LDL CHOLESTEROL, DIRECT: Direct LDL: 56 mg/dL

## 2019-05-31 NOTE — Addendum Note (Signed)
Addended by: Christiana Fuchs on: 05/31/2019 09:15 AM   Modules accepted: Orders

## 2019-05-31 NOTE — Patient Instructions (Addendum)
Blood pressure slightly high in office today. Lets have you check twice a week over next month and then message me. As long as equal to or lower than 138/88 we will continue current meds  Please stop by lab before you go If you do not have mychart- we will call you about results within 5 business days of [place] receiving them.  If you have mychart- we will send your results within 3 business days of [place] receiving them.  If abnormal or we want to clarify a result, we will call or mychart you to make sure you receive the message.  If you have questions or concerns or don't hear within 5-7 days, please send [place] a message or call us.

## 2019-05-31 NOTE — Progress Notes (Signed)
Phone: [phone]   Subjective:  Patient presents today for their annual physical. Chief complaint-noted.   See problem oriented charting- ROS- full  review of systems was completed and negative  except for: light sensitivity  The following were reviewed and entered/updated in epic: Past Medical History:  Diagnosis Date  . Allergy   . Headache(784.0)   . Hiatal hernia   . Hyperlipidemia   . Hypertension   . Sinusitis    Patient Active Problem List   Diagnosis Date Noted  . History of bacteremia 02/26/2018    Priority: High  . UTI (urinary tract infection) 02/25/2018    Priority: High  . History of adenomatous polyp of colon 08/15/2017    Priority: Medium  . Essential hypertension 12/09/2009    Priority: Medium  . Hyperlipidemia 03/07/2007    Priority: Medium  . Migraine 12/08/2006    Priority: Medium  . Hyperglycemia 05/21/2016    Priority: Low  . Thrombocytopenia ([place]) 05/21/2016    Priority: Low  . Allergic rhinitis 01/04/2014    Priority: Low  . Anxiety state, unspecified 05/25/2010    Priority: Low   Past Surgical History:  Procedure Laterality Date  . COLONOSCOPY     int hems only- with stark   . colonscopy    . WISDOM TOOTH EXTRACTION      Family History  Problem Relation Age of Onset  . Hypertension Mother   . Aneurysm Mother   . Glaucoma Father   . Heart attack Father        age 30  . Colon cancer Neg Hx   . Colon polyps Neg Hx   . Esophageal cancer Neg Hx   . Rectal cancer Neg Hx   . Stomach cancer Neg Hx     Medications- reviewed and updated Current Outpatient Medications  Medication Sig Dispense Refill  . aspirin 81 MG tablet Take 81 mg by mouth daily.    . bisoprolol-hydrochlorothiazide (ZIAC) 10-6.25 MG tablet TAKE 1 TABLET BY MOUTH DAILY 90 tablet 1  . ezetimibe-simvastatin (VYTORIN) 10-20 MG tablet TAKE 1 TABLET BY MOUTH AT BEDTIME 90 tablet 2  . fluticasone (KLS ALLER-FLO) 50 MCG/ACT nasal spray Place 1 spray into both nostrils  daily.    Marland Kitchen lisinopril (ZESTRIL) 5 MG tablet TAKE 1 TABLET(5 MG) BY MOUTH DAILY 90 tablet 1  . multivitamin (THERAGRAN) per tablet Take 1 tablet by mouth daily.      . SUMAtriptan (IMITREX) 100 MG tablet Take 1 tablet (100 mg total) by mouth every 2 (two) hours as needed for migraine. May repeat in 2 hours if headache persists or recurs. 10 tablet 5  . ondansetron (ZOFRAN-ODT) 8 MG disintegrating tablet Take 1 tablet (8 mg total) by mouth every 8 (eight) hours as needed for nausea or vomiting. 20 tablet 0  . vitamin B-12 (CYANOCOBALAMIN) 1000 MCG tablet Take 5,000 mcg by mouth daily.     No current facility-administered medications for this visit.    Allergies-reviewed and updated Allergies  Allergen Reactions  . Ambien [Zolpidem Tartrate]     Used during hospitalization- became delirious     Social History   Social History Narrative   Married 40 years in 2020 (married 1980). 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    Played baseball at [employer]. Wife went to [employer].       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 TM normal. Bridge of nose normal. Eyelids normal.  Neck: no thyromegaly or cervical lymphadenopathy  CV: RRR no murmurs rubs or gallops Lungs: CTAB no crackles, wheeze, rhonchi Abdomen: soft/nontender/nondistended/normal bowel sounds. No rebound or guarding.  Ext: no edema Skin: warm, dry Neuro: grossly normal, moves all extremities, PERRLA Rectal: normal tone, diffusely enlarged prostate, no masses or tenderness     Assessment and Plan  64 y.o. male presenting for annual  physical.  Health Maintenance counseling: 1. Anticipatory guidance: Patient counseled regarding regular dental exams yes q6 months, eye exams yes yearly,  avoiding smoking and second hand smoke yes , limiting alcohol to 2 beverages per day-last year we discussed limiting at max to 14 alcoholic beverages per week-occasionally he was up to 20 but could be as low as 12-this year he reports 2-3 every per day averaging 15-21 a week he thinks 2. Risk factor reduction:  Advised patient of need for regular exercise and diet rich and fruits and vegetables to reduce risk of heart attack and stroke. Exercise-continue syndrome regularly.  10K steps most days or out biking  When weather is better and golfs 2-3 times a week.  Lifts weights at home randomly and active with furniture lifting helping wife. Diet-last year was eating out a fair amount but trying to choose healthier options.  His long-term weight goal has been 190.- luckily he has at least been stable- he is up 5 lbs from father passing and holidays- he thinks he can get this back down.  Wt Readings from Last 3 Encounters:  05/31/19 197 lb 9.6 oz (89.6 kg)  05/29/18 197 lb 12.8 oz (89.7 kg)  03/22/18 197 lb 12.8 oz (89.7 kg)  3. Immunizations/screenings/ancillary studies-up-to-date other than COVID-19 which he is interested in when available for his group/phase Immunization History  Administered Date(s) Administered  . Influenza Split 04/23/2011  . Influenza Whole 01/03/2009  . Influenza,inj,Quad PF,6+ Mos 01/04/2014, 01/22/2015, 05/25/2016, 05/13/2017, 04/04/2018, 02/20/2019  . Td 04/26/2006  . Tdap 05/25/2016  . Zoster Recombinat (Shingrix) 05/29/2018, 09/26/2018   4. Prostate cancer screening- last year had elevated PSA and we have planned for follow-up but unfortunately that was around the time of COVID-19 and transforming of pandemic.  Due to prior PSA trend will get rectal and PSA today.  BPH likely contributing to rise-did have bacteremia in  2019 and BPH likely contributed. Does have some frequency but no recent worsening Lab Results  Component Value Date   PSA 2.01 05/29/2018   PSA 1.12 05/27/2017   PSA 0.97 05/14/2016   5. Colon cancer screening - August 02, 2017 with 5-year follow-up due to history of adenomatous colon polyp 6. Skin cancer screening-no dermatologist.. advised regular sunscreen use. Denies worrisome, changing, or new skin lesions.  7.  Never smoker  Status of chronic or acute concerns    #Hypertension S: Compliant with lisinopril 5 mg, bisoprolol hydrochlorothiazide 10-6.25 mg.  Home blood pressure monitoring - home #s sparingly check but usually 120s- 130s over 70s or 80s A/P: Blood pressure slightly high in office today. Lets have you check twice a week over next month and then message me.  As long as equal to or lower than 138/88 we will continue current meds =likely white coat element  #Hyperlipidemia S: Compliant with Vytorin.  Does get some myalgias at times.also takes asprin Lab Results  Component Value Date   CHOL 140 05/29/2018   HDL 39.10 05/29/2018   LDLCALC 53 04/12/2012   LDLDIRECT 55.0 05/29/2018   TRIG 232.0 (H) 05/29/2018   CHOLHDL 4 05/29/2018   A/P: LDL at ideal goal under 70-continue current medication.  Work on lifestyle to get triglycerides down  #Migraines S: Typical pattern is approximately 3 times a year but only 1 in last year.  Sumatriptan 100 mg is helpful. Light sensitivity but can also trigger it A/P:   Stable. Continue current medications.    #Allergic rhinitis S: zyrtec helpful recently - side effects not bothering him in 2021- has in past. Also on nasocort A/P:   Stable. Continue current medications.    #Thrombocytopenia- has had thrombocytopenia for years.  We will trend this today.  % History of bacteremia-UTI led to bacteremia in late 2019.  Patient hospitalized at the time.  No history of UTIs outside of this and presented with primarily nausea/vomiting.  BPH  likely contributed   % intermittent equilibrium issues- has done well early 2021 . He wonders if this was actually from urinary issues brewing.   #Hyperglycemia-blood sugars high in the past before weight loss.  Update CBG and A1c Lab Results  Component Value Date   HGBA1C 5.4 08/09/2014   Recommended follow up: Return in about 6 months (around 11/28/2019) for follow up- or sooner if needed.  Lab/Order associations: fasting   ICD-10-CM   1. Preventative health care  Z00.00 PSA    CBC with Differential/Platelet    Comprehensive metabolic panel    Lipid panel    Hemoglobin A1c  2. History of bacteremia  Z87.898   3. Other migraine without status migrainosus, not intractable  G43.809   4. Mixed hyperlipidemia  E78.2 CBC with Differential/Platelet    Comprehensive metabolic panel    Lipid panel    POCT Urinalysis Dipstick (Automated)  5. History of adenomatous polyp of colon  Z86.010   6. Essential hypertension  I10 CBC with Differential/Platelet    Comprehensive metabolic panel    Lipid panel    POCT Urinalysis Dipstick (Automated)  7. Thrombocytopenia ([place])  D69.6   8. Hyperglycemia  R73.9 Hemoglobin A1c  9. Screening for prostate cancer  Z12.5 PSA    No orders of the defined types were placed in this encounter.   Return precautions advised.  [name], MD

## 2019-06-19 ENCOUNTER — Encounter: Payer: Self-pay | Admitting: Family Medicine

## 2019-06-20 ENCOUNTER — Encounter: Payer: Self-pay | Admitting: Family Medicine

## 2019-06-21 MED ORDER — LISINOPRIL 10 MG PO TABS: 10.0000 mg | ORAL_TABLET | Freq: Every day | ORAL | 3 refills | Status: DC

## 2019-07-05 ENCOUNTER — Other Ambulatory Visit: Payer: Self-pay | Admitting: Family Medicine

## 2019-07-10 DIAGNOSIS — H2513 Age-related nuclear cataract, bilateral: Secondary | ICD-10-CM | POA: Diagnosis not present

## 2019-07-10 DIAGNOSIS — H43813 Vitreous degeneration, bilateral: Secondary | ICD-10-CM | POA: Diagnosis not present

## 2019-07-10 DIAGNOSIS — H5213 Myopia, bilateral: Secondary | ICD-10-CM | POA: Diagnosis not present

## 2019-11-08 ENCOUNTER — Other Ambulatory Visit: Payer: Self-pay

## 2019-11-08 MED ORDER — EZETIMIBE-SIMVASTATIN 10-20 MG PO TABS: 1.0000 | ORAL_TABLET | Freq: Every day | ORAL | 2 refills | Status: DC

## 2019-11-12 ENCOUNTER — Encounter: Payer: Self-pay | Admitting: Family Medicine

## 2019-11-12 ENCOUNTER — Other Ambulatory Visit: Payer: Self-pay | Admitting: Family Medicine

## 2019-11-12 DIAGNOSIS — E782 Mixed hyperlipidemia: Secondary | ICD-10-CM

## 2019-11-13 ENCOUNTER — Other Ambulatory Visit: Payer: Self-pay

## 2019-11-13 MED ORDER — LISINOPRIL 10 MG PO TABS: 10.0000 mg | ORAL_TABLET | Freq: Every day | ORAL | 3 refills | Status: DC

## 2019-11-29 ENCOUNTER — Ambulatory Visit: Payer: BC Managed Care – PPO | Admitting: Family Medicine

## 2019-11-29 ENCOUNTER — Encounter: Payer: Self-pay | Admitting: Family Medicine

## 2019-11-29 ENCOUNTER — Other Ambulatory Visit: Payer: Self-pay | Admitting: Family Medicine

## 2019-11-29 ENCOUNTER — Other Ambulatory Visit: Payer: Self-pay

## 2019-11-29 VITALS — BP 144/90 | HR 65 | Temp 98.2°F | Ht 73.0 in | Wt 198.0 lb

## 2019-11-29 DIAGNOSIS — R739 Hyperglycemia, unspecified: Secondary | ICD-10-CM

## 2019-11-29 DIAGNOSIS — G43809 Other migraine, not intractable, without status migrainosus: Secondary | ICD-10-CM | POA: Diagnosis not present

## 2019-11-29 DIAGNOSIS — I10 Essential (primary) hypertension: Secondary | ICD-10-CM | POA: Diagnosis not present

## 2019-11-29 DIAGNOSIS — D696 Thrombocytopenia, unspecified: Secondary | ICD-10-CM | POA: Diagnosis not present

## 2019-11-29 DIAGNOSIS — E782 Mixed hyperlipidemia: Secondary | ICD-10-CM | POA: Diagnosis not present

## 2019-11-29 LAB — COMPREHENSIVE METABOLIC PANEL
AG Ratio: 1.8 (calc) (ref 1.0–2.5)
ALT: 36 U/L (ref 9–46)
AST: 32 U/L (ref 10–35)
Albumin: 4.5 g/dL (ref 3.6–5.1)
Alkaline phosphatase (APISO): 52 U/L (ref 35–144)
BUN: 16 mg/dL (ref 7–25)
CO2: 29 mmol/L (ref 20–32)
Calcium: 9.6 mg/dL (ref 8.6–10.3)
Chloride: 102 mmol/L (ref 98–110)
Creat: 1.07 mg/dL (ref 0.70–1.25)
Globulin: 2.5 g/dL (calc) (ref 1.9–3.7)
Glucose, Bld: 106 mg/dL — ABNORMAL HIGH (ref 65–99)
Potassium: 4.3 mmol/L (ref 3.5–5.3)
Sodium: 138 mmol/L (ref 135–146)
Total Bilirubin: 1 mg/dL (ref 0.2–1.2)
Total Protein: 7 g/dL (ref 6.1–8.1)

## 2019-11-29 LAB — CBC WITH DIFFERENTIAL/PLATELET
Absolute Monocytes: 470 cells/uL (ref 200–950)
Basophils Absolute: 52 cells/uL (ref 0–200)
Basophils Relative: 1.1 %
Eosinophils Absolute: 132 cells/uL (ref 15–500)
Eosinophils Relative: 2.8 %
HCT: 46.7 % (ref 38.5–50.0)
Hemoglobin: 15.8 g/dL (ref 13.2–17.1)
Lymphs Abs: 1100 cells/uL (ref 850–3900)
MCH: 32.6 pg (ref 27.0–33.0)
MCHC: 33.8 g/dL (ref 32.0–36.0)
MCV: 96.3 fL (ref 80.0–100.0)
MPV: 11.9 fL (ref 7.5–12.5)
Monocytes Relative: 10 %
Neutro Abs: 2947 cells/uL (ref 1500–7800)
Neutrophils Relative %: 62.7 %
Platelets: 115 10*3/uL — ABNORMAL LOW (ref 140–400)
RBC: 4.85 10*6/uL (ref 4.20–5.80)
RDW: 12 % (ref 11.0–15.0)
Total Lymphocyte: 23.4 %
WBC: 4.7 10*3/uL (ref 3.8–10.8)

## 2019-11-29 NOTE — Progress Notes (Signed)
Phone [phone] In person visit   Subjective:   Frank Joseph. is a 64 y.o. year old very pleasant male patient who presents for/with See problem oriented charting Chief Complaint  Patient presents with   Follow-up   Hypertension   Hyperlipidemia   This visit occurred during the SARS-CoV-2 public health emergency.  Safety protocols were in place, including screening questions prior to the visit, additional usage of staff PPE, and extensive cleaning of exam room while observing appropriate contact time as indicated for disinfecting solutions.   Past Medical History-  Patient Active Problem List   Diagnosis Date Noted   History of bacteremia 02/26/2018    Priority: High   UTI (urinary tract infection) 02/25/2018    Priority: High   History of adenomatous polyp of colon 08/15/2017    Priority: Medium   Essential hypertension 12/09/2009    Priority: Medium   Hyperlipidemia 03/07/2007    Priority: Medium   Migraine 12/08/2006    Priority: Medium   Hyperglycemia 05/21/2016    Priority: Low   Thrombocytopenia ([place]) 05/21/2016    Priority: Low   Allergic rhinitis 01/04/2014    Priority: Low   Anxiety state, unspecified 05/25/2010    Priority: Low    Medications- reviewed and updated Current Outpatient Medications  Medication Sig Dispense Refill   aspirin 81 MG tablet Take 81 mg by mouth daily.     bisoprolol-hydrochlorothiazide (ZIAC) 10-6.25 MG tablet TAKE 1 TABLET BY MOUTH DAILY 90 tablet 1   ezetimibe-simvastatin (VYTORIN) 10-20 MG tablet Take 1 tablet by mouth at bedtime. 90 tablet 2   fluticasone (KLS ALLER-FLO) 50 MCG/ACT nasal spray Place 1 spray into both nostrils daily.     lisinopril (ZESTRIL) 10 MG tablet Take 1 tablet (10 mg total) by mouth daily. 90 tablet 3   multivitamin (THERAGRAN) per tablet Take 1 tablet by mouth daily.       SUMAtriptan (IMITREX) 100 MG tablet Take 1 tablet (100 mg total) by mouth every 2 (two) hours as needed  for migraine. May repeat in 2 hours if headache persists or recurs. 10 tablet 5   No current facility-administered medications for this visit.     Objective:  BP (!) 144/90    Pulse 65    Temp 98.2 F (36.8 C)    Ht 6\' 1"  (1.854 m)    Wt 198 lb (89.8 kg)    SpO2 99%    BMI 26.12 kg/m  Gen: NAD, resting comfortably CV: RRR no murmurs rubs or gallops Lungs: CTAB no crackles, wheeze, rhonchi Abdomen: soft/nontender/nondistended Ext: no edema Skin: warm, dry     Assessment and Plan    #Hypertension with whitecoat element S: Compliant with lisinopril 10 mg, bisoprolol hydrochlorothiazide 10-6.25 mg.  Home blood pressure monitoring- before increase to 10 mg BP averaging 136/81 at home. He did several checks after the change  And came down to 128/76.    BP Readings from Last 3 Encounters:  11/29/19 (!) 144/90  05/31/19 (!) 150/84  05/29/18 128/88   A/P: Slightly high in office but I think this is related to whitecoat hypertension-home numbers look excellent-recommended checking once a month at home to make sure maintaining less than 140/90   #Hyperlipidemia S: Compliant with Vytorin. Lab Results  Component Value Date   CHOL 155 05/31/2019   HDL 43.30 05/31/2019   LDLCALC 53 04/12/2012   LDLDIRECT 56.0 05/31/2019   TRIG 325.0 (H) 05/31/2019   CHOLHDL 4 05/31/2019   A/P: Stable. Continue  current medications.    #Migraines S: Typical pattern is approximately 3 times a year- still about once in last year and mild.  Sumatriptan 100 mg is helpful. A/P: doing really well- continue as needed.    #Thrombocytopenia- has had thrombocytopenia for years.  Hopefully stable- as long as other cell lines normal- continue to monitor Lab Results  Component Value Date   WBC 5.7 05/31/2019   HGB 16.0 05/31/2019   HCT 47.1 05/31/2019   MCV 95.6 05/31/2019   PLT 120.0 (L) 05/31/2019   #Allergies-reasonable control with Zyrtec  Recommended follow up:  6 months physical  Lab/Order  associations:   ICD-10-CM   1. Essential hypertension  I10 CBC with Differential/Platelet    Comprehensive metabolic panel  2. Mixed hyperlipidemia  E78.2   3. Other migraine without status migrainosus, not intractable  G43.809   4. Thrombocytopenia ([place])  D69.6   5. Hyperglycemia  R73.9    Return precautions advised.  [name], MD

## 2019-11-29 NOTE — Patient Instructions (Addendum)
Please stop by lab before you go If you have mychart- we will send your results within 3 business days of [place] receiving them.  If you do not have mychart- we will call you about results within 5 business days of [place] receiving them.  *please note we are currently using [facility] which has a longer processing time than [place] typically so labs may not come back as quickly as in the past *please also note that you will see labs on mychart as soon as they post. I will later go in and write notes on them- will say "notes from Dr. Yong Channel"   Check bp once a month at home while relaxed.  Continue current cholesterol medicine.  Schedule 6 month CPE at check out.

## 2019-11-29 NOTE — Addendum Note (Signed)
Addended by: Thomes Lolling on: 11/29/2019 10:21 AM   Modules accepted: Orders

## 2019-11-30 ENCOUNTER — Encounter: Payer: Self-pay | Admitting: Family Medicine

## 2020-01-02 ENCOUNTER — Other Ambulatory Visit: Payer: Self-pay | Admitting: Family Medicine

## 2020-02-05 ENCOUNTER — Encounter: Payer: Self-pay | Admitting: Family Medicine

## 2020-02-20 NOTE — Progress Notes (Signed)
Phone [phone] In person visit   Subjective:   Frank Joseph. is a 64 y.o. year old very pleasant male patient who presents for/with See problem oriented charting Chief Complaint  Patient presents with  . Rash   This visit occurred during the SARS-CoV-2 public health emergency.  Safety protocols were in place, including screening questions prior to the visit, additional usage of staff PPE, and extensive cleaning of exam room while observing appropriate contact time as indicated for disinfecting solutions.   Past Medical History-  Patient Active Problem List   Diagnosis Date Noted  . History of bacteremia 02/26/2018    Priority: High  . UTI (urinary tract infection) 02/25/2018    Priority: High  . History of adenomatous polyp of colon 08/15/2017    Priority: Medium  . White coat syndrome with diagnosis of hypertension 12/09/2009    Priority: Medium  . Hyperlipidemia 03/07/2007    Priority: Medium  . Migraine 12/08/2006    Priority: Medium  . Hyperglycemia 05/21/2016    Priority: Low  . Thrombocytopenia ([place]) 05/21/2016    Priority: Low  . Allergic rhinitis 01/04/2014    Priority: Low  . Anxiety state, unspecified 05/25/2010    Priority: Low    Medications- reviewed and updated Current Outpatient Medications  Medication Sig Dispense Refill  . aspirin 81 MG tablet Take 81 mg by mouth daily.    . bisoprolol-hydrochlorothiazide (ZIAC) 10-6.25 MG tablet TAKE 1 TABLET BY MOUTH DAILY 90 tablet 1  . ezetimibe-simvastatin (VYTORIN) 10-20 MG tablet Take 1 tablet by mouth at bedtime. 90 tablet 2  . fluticasone (KLS ALLER-FLO) 50 MCG/ACT nasal spray Place 1 spray into both nostrils daily.    Marland Kitchen lisinopril (ZESTRIL) 10 MG tablet Take 1 tablet (10 mg total) by mouth daily. 90 tablet 3  . multivitamin (THERAGRAN) per tablet Take 1 tablet by mouth daily.      . SUMAtriptan (IMITREX) 100 MG tablet Take 1 tablet (100 mg total) by mouth every 2 (two) hours as needed for  migraine. May repeat in 2 hours if headache persists or recurs. 10 tablet 5  . predniSONE (DELTASONE) 20 MG tablet Take 2 pills for 3 days, 1 pill for 4 days 10 tablet 0   No current facility-administered medications for this visit.     Objective:  BP 122/80   Pulse 66   Temp 98.8 F (37.1 C) (Temporal)   Resp 18   Ht 6\' 1"  (1.854 m)   Wt 196 lb 12.8 oz (89.3 kg)   SpO2 99%   BMI 25.96 kg/m  Gen: NAD, resting comfortably TM normal, oropharynx normal, nares mild erythema on the right side CV: RRR no murmurs rubs or gallops Lungs: CTAB no crackles, wheeze, rhonchi Ext: no edema Skin: warm, dry, multiple erythetmatous patches not raised about 1 x 1 cm blanching under left axilla (has confluent patch here) down left flank around left waist band around the buttocks and onto right waistband up right flank and into right axilla. No excoriation    Assessment and Plan  #Rash S:Patient mentioned that he has had the rash since mid September.  Patient mentioned that he noticed the rash when he was in [place]st anniversary, he's unsure if It could be the amount of sunscreen or seafood  (a lot of shrimp and fried food which had not had in sometime) that he ate while he was down there.  Rash started in left armpit while he was down there- then noted  on left hip and right hip, also notes on his side and his back as well. Wondered if towels had different detergent- did use their soap and shampoo once. No detergents soaps, shampoos etc have changed at home.   He stated that he has tried otc cortisone, neosporin, aveeno lotion. No pain, itching or fever.   October 5th patient with 3rd booster for covid 19- was only in armpit before the booster. No tick exposure ROS-not ill appearing, no fever/chills. No new medications other than booster. Not immunocompromised. No mucus membrane involvement.  A/P: Rash of unclear etiology.  Started with a rash in the left axilla that does not  appear to be a target lesion.  Considered herald patch but rest of rash is not consistent with pityriasis rosea typical distribution.  Rash worsened and spread to other areas of the body after COVID-19 vaccination-could be a mild vaccination reaction.  Has responded some to topical steroids.  Given extent of distribution I think a trial of prednisone is reasonable as she will update me in 7 to 10 days.  Depending on progress or if recurrence consider longer prednisone taper or referral to dermatology or punch biopsy.  Discussed potential side effects of prednisone-hoping he can tolerate this-also hoping blood pressure tolerates this.    #Hypertension S: Compliant with lisinopril 10 mg, bisoprolol hydrochlorothiazide 10-6.25 mg. BP Readings from Last 3 Encounters:  02/21/20 122/80  11/29/19 (!) 144/90  05/31/19 (!) 150/84  A/P: Typically elevated in office but good control today-continue current medications  #Allergic rhinitis S: Patient reports some right-sided sinus congestion.  He is compliant with fluticasone nasal spray.  Has not recently tried sinus rinses A/P: With increased right sinus congestion-prednisone may be beneficial.  Can continue fluticasone.  Also recommended sinus rinses   Recommended follow up: Keep February follow-up unless needs to be seen sooner for acute concerns Future Appointments  Date Time Provider [place]  06/03/2020 10:40 AM Marin Olp, MD [facility]    Lab/Order associations:   ICD-10-CM   1. Rash  R21 CBC With Differential/Platelet    COMPLETE METABOLIC PANEL WITH GFR    COMPLETE METABOLIC PANEL WITH GFR    CBC With Differential/Platelet  2. White coat syndrome with diagnosis of hypertension  I10   3. Non-seasonal allergic rhinitis due to other allergic trigger  J30.89     Meds ordered this encounter  Medications  . predniSONE (DELTASONE) 20 MG tablet    Sig: Take 2 pills for 3 days, 1 pill for 4 days    Dispense:  10 tablet     Refill:  0   Return precautions advised.  [name], MD

## 2020-02-20 NOTE — Patient Instructions (Addendum)
Health Maintenance Due  Topic Date Due   INFLUENZA VACCINE lets wait on flu shot until rash clears 11/25/2019   Try prednisone for 7 days.   Update me in 7-8 days with how you are doing. Also if after prednisone symptoms worsen please let me know  If no improvement may need to consider dermatology vs. A punch biopsy to get more information.   Please stop by lab before you go If you have mychart- we will send your results within 3 business days of [place] receiving them.  If you do not have mychart- we will call you about results within 5 business days of [place] receiving them.  *please note we are currently using [facility] which has a longer processing time than  typically so labs may not come back as quickly as in the past *please also note that you will see labs on mychart as soon as they post. I will later go in and write notes on them- will say "notes from Dr. Yong Channel"

## 2020-02-21 ENCOUNTER — Ambulatory Visit: Payer: BC Managed Care – PPO | Admitting: Family Medicine

## 2020-02-21 ENCOUNTER — Encounter: Payer: Self-pay | Admitting: Family Medicine

## 2020-02-21 ENCOUNTER — Other Ambulatory Visit: Payer: Self-pay

## 2020-02-21 VITALS — BP 122/80 | HR 66 | Temp 98.8°F | Resp 18 | Ht 73.0 in | Wt 196.8 lb

## 2020-02-21 DIAGNOSIS — J3089 Other allergic rhinitis: Secondary | ICD-10-CM | POA: Diagnosis not present

## 2020-02-21 DIAGNOSIS — I10 Essential (primary) hypertension: Secondary | ICD-10-CM | POA: Diagnosis not present

## 2020-02-21 DIAGNOSIS — R21 Rash and other nonspecific skin eruption: Secondary | ICD-10-CM

## 2020-02-21 LAB — CBC WITH DIFFERENTIAL/PLATELET
Absolute Monocytes: 436 cells/uL (ref 200–950)
Basophils Absolute: 49 cells/uL (ref 0–200)
Basophils Relative: 1 %
Eosinophils Absolute: 98 cells/uL (ref 15–500)
Eosinophils Relative: 2 %
HCT: 45.7 % (ref 38.5–50.0)
Hemoglobin: 15.9 g/dL (ref 13.2–17.1)
Lymphs Abs: 975 cells/uL (ref 850–3900)
MCH: 33.3 pg — ABNORMAL HIGH (ref 27.0–33.0)
MCHC: 34.8 g/dL (ref 32.0–36.0)
MCV: 95.8 fL (ref 80.0–100.0)
MPV: 11.8 fL (ref 7.5–12.5)
Monocytes Relative: 8.9 %
Neutro Abs: 3342 cells/uL (ref 1500–7800)
Neutrophils Relative %: 68.2 %
Platelets: 126 10*3/uL — ABNORMAL LOW (ref 140–400)
RBC: 4.77 10*6/uL (ref 4.20–5.80)
RDW: 11.7 % (ref 11.0–15.0)
Total Lymphocyte: 19.9 %
WBC: 4.9 10*3/uL (ref 3.8–10.8)

## 2020-02-21 LAB — COMPLETE METABOLIC PANEL WITH GFR
AG Ratio: 2.1 (calc) (ref 1.0–2.5)
ALT: 34 U/L (ref 9–46)
AST: 29 U/L (ref 10–35)
Albumin: 4.7 g/dL (ref 3.6–5.1)
Alkaline phosphatase (APISO): 55 U/L (ref 35–144)
BUN: 13 mg/dL (ref 7–25)
CO2: 29 mmol/L (ref 20–32)
Calcium: 9.9 mg/dL (ref 8.6–10.3)
Chloride: 102 mmol/L (ref 98–110)
Creat: 1 mg/dL (ref 0.70–1.25)
GFR, Est African American: 92 mL/min/{1.73_m2} (ref >=60)
GFR, Est Non African American: 79 mL/min/{1.73_m2} (ref >=60)
Globulin: 2.2 g/dL (calc) (ref 1.9–3.7)
Glucose, Bld: 150 mg/dL — ABNORMAL HIGH (ref 65–99)
Potassium: 4 mmol/L (ref 3.5–5.3)
Sodium: 138 mmol/L (ref 135–146)
Total Bilirubin: 1.1 mg/dL (ref 0.2–1.2)
Total Protein: 6.9 g/dL (ref 6.1–8.1)

## 2020-02-21 MED ORDER — PREDNISONE 20 MG PO TABS: ORAL_TABLET | ORAL | 0 refills | Status: DC

## 2020-02-22 ENCOUNTER — Encounter: Payer: Self-pay | Admitting: Family Medicine

## 2020-02-28 ENCOUNTER — Encounter: Payer: Self-pay | Admitting: Family Medicine

## 2020-03-06 ENCOUNTER — Encounter: Payer: Self-pay | Admitting: Family Medicine

## 2020-03-07 ENCOUNTER — Other Ambulatory Visit: Payer: Self-pay

## 2020-03-07 MED ORDER — PREDNISONE 20 MG PO TABS: ORAL_TABLET | ORAL | 0 refills | Status: DC

## 2020-03-15 ENCOUNTER — Encounter: Payer: Self-pay | Admitting: Family Medicine

## 2020-05-31 NOTE — Progress Notes (Signed)
Phone: [phone]   Subjective:  Patient presents today for their annual physical. Chief complaint-noted.   See problem oriented charting- ROS- full  review of systems was completed and negative per full ROS sheet  The following were reviewed and entered/updated in epic: Past Medical History:  Diagnosis Date  . Allergy   . Headache(784.0)   . Hiatal hernia   . Hyperlipidemia   . Hypertension   . Sinusitis    Patient Active Problem List   Diagnosis Date Noted  . History of bacteremia 02/26/2018    Priority: High  . UTI (urinary tract infection) 02/25/2018    Priority: High  . History of adenomatous polyp of colon 08/15/2017    Priority: Medium  . White coat syndrome with diagnosis of hypertension 12/09/2009    Priority: Medium  . Hyperlipidemia 03/07/2007    Priority: Medium  . Migraine 12/08/2006    Priority: Medium  . Hyperglycemia 05/21/2016    Priority: Low  . Thrombocytopenia ([place]) 05/21/2016    Priority: Low  . Allergic rhinitis 01/04/2014    Priority: Low  . Anxiety state, unspecified 05/25/2010    Priority: Low   Past Surgical History:  Procedure Laterality Date  . COLONOSCOPY     int hems only- with stark   . colonscopy    . WISDOM TOOTH EXTRACTION      Family History  Problem Relation Age of Onset  . Hypertension Mother   . Aneurysm Mother   . Glaucoma Father   . Heart attack Father        age 43  . Colon cancer Neg Hx   . Colon polyps Neg Hx   . Esophageal cancer Neg Hx   . Rectal cancer Neg Hx   . Stomach cancer Neg Hx     Medications- reviewed and updated Current Outpatient Medications  Medication Sig Dispense Refill  . bisoprolol-hydrochlorothiazide (ZIAC) 10-6.25 MG tablet TAKE 1 TABLET BY MOUTH DAILY 90 tablet 1  . ezetimibe-simvastatin (VYTORIN) 10-20 MG tablet Take 1 tablet by mouth at bedtime. 90 tablet 2  . fluticasone (FLONASE) 50 MCG/ACT nasal spray Place 1 spray into both nostrils daily.    Marland Kitchen lisinopril (ZESTRIL) 10 MG  tablet Take 1 tablet (10 mg total) by mouth daily. 90 tablet 3  . Loratadine (CLARITIN PO) Take by mouth. Half a pill daily.    . multivitamin (THERAGRAN) per tablet Take 1 tablet by mouth daily.    . SUMAtriptan (IMITREX) 100 MG tablet Take 1 tablet (100 mg total) by mouth every 2 (two) hours as needed for migraine. May repeat in 2 hours if headache persists or recurs. 10 tablet 5   No current facility-administered medications for this visit.    Allergies-reviewed and updated Allergies  Allergen Reactions  . Ambien [Zolpidem Tartrate]     Used during hospitalization- became delirious     Social History   Social History Narrative   Married 40 years in 2020 (married 1980). 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    Played baseball at [employer]. Wife went to [employer].       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 Oropharynx normal Neck: no thyromegaly CV: RRR no murmurs rubs or gallops Lungs: CTAB no crackles, wheeze, rhonchi Abdomen: soft/nontender/nondistended/normal bowel sounds. No rebound or guarding.  Ext: no edema Skin: warm, dry Neuro: grossly normal, moves all extremities, PERRLA   Assessment and Plan  65 y.o. male presenting for annual physical.  Health Maintenance counseling: 1. Anticipatory guidance: Patient counseled regarding regular dental exams -q6 months, eye exams -yearly,  avoiding smoking and second hand smoke , limiting alcohol to 2 beverages per day -last year averaging 15-21 drinks per week and we have recommended limiting this to 14 max.  Around the same this year.   2. Risk factor reduction:  Advised patient of need for regular exercise and diet rich and fruits and vegetables to reduce risk of heart attack and stroke. Exercise- lifts weights daily and getting his steps in- 10k steps- does steps while watching tv. also very active with golf and drums. Diet-holidays with slight increase- trying to improve since that time.  Weight only up 1 pound from last physical Wt Readings from Last 3 Encounters:  06/03/20 198 lb 3.2 oz (89.9 kg)  02/21/20 196 lb 12.8 oz (89.3 kg)  11/29/19 198 lb (89.8 kg)  . Immunizations/screenings/ancillary studies-discussed flu shot and Prevnar 13 versus Pneumovax 23-opted for pneumovax 23 today  Immunization History  Administered Date(s) Administered  . Influenza Split 04/23/2011  . Influenza Whole 01/03/2009  . Influenza,inj,Quad PF,6+ Mos 01/04/2014, 01/22/2015, 05/25/2016, 05/13/2017, 04/04/2018, 02/20/2019  . PFIZER(Purple Top)SARS-COV-2 Vaccination 07/01/2019, 08/10/2019, 01/29/2020  . Td 04/26/2006  . Tdap 05/25/2016  . Zoster Recombinat (Shingrix) 05/29/2018, 09/26/2018   4. Prostate cancer screening- other than bump in 2020 overall trend has been low risk for prostate cancer-continue to trend with labs today.  Has had some BPH on exam.    Lab Results  Component Value Date   PSA 0.97 05/31/2019   PSA 2.01 05/29/2018   PSA 1.12 05/27/2017   5. Colon cancer screening - history adenomatous polyps of the colon April 2019 with 5-year repeat planned 6. Skin cancer screening-no dermatologist-offered referral. advised regular sunscreen use. Denies worrisome, changing, or new skin lesions.  7.  Never smoker 8. STD screening -only active with wife so does not need  Status of chronic or acute concerns    #Hypertension with history of whitecoat hypertension S: Compliant with lisinopril 10 mg, bisoprolol hydrochlorothiazide 10-6.25 mg.  Home blood pressure monitoring: BP better at times when not worried  About results like labs-  like when he came in for rash. 120s/130s/80s if he does check   BP Readings from Last 3 Encounters:  06/03/20 (!) 152/100  02/21/20 122/80  11/29/19 (!) 144/90   A/P: poor control in office again- will do some home monitoring- continue current meds for now.    #Hyperlipidemia S: Compliant with Vytorin. Lab Results  Component Value Date   CHOL 155 05/31/2019   HDL 43.30 05/31/2019   LDLCALC 53 04/12/2012   LDLDIRECT 56.0 05/31/2019   TRIG 325.0 (H) 05/31/2019   CHOLHDL 4 05/31/2019   A/P: Reasonable control last check-would prefer triglycerides to be lower-update lipid panel today. We opted to stop aspirin  #Migraines S: Typical pattern is approximately 3 times a year.  Sumatriptan 100 mg is helpful-does get some paresthesias with this- wants to consider nurtec downt he road potentially A/P: a few more regularly recently- one may have been triggered with cold  weather while playing golf. - had 2 episodes wihtin a month which is atypical for him- he will monitor na dif continues to have more episodes will let us know.  Also a protein bar sounds to have been a trigger  #Allergic rhinitis S: zyrtec or claritin helpful recently - side effects not bothering him in 2021- has in past.  also on nasocort (occasional nosebleeds). Still some nose running A/P: reaosnable control- continue current meds   #Thrombocytopenia- has had thrombocytopenia for years.  We will trend this today. Lab Results  Component Value Date   WBC 4.9 02/21/2020   HGB 15.9 02/21/2020   HCT 45.7 02/21/2020   MCV 95.8 02/21/2020   PLT 126 (L) 02/21/2020   % History of bacteremia-UTI led to bacteremia in late 2019.  Patient hospitalized at the time.  No history of UTIs outside of this and presented with primarily nausea/vomiting.  BPH likely contributed -He prefers to get a UA yearly  #uses aleve with golf- x1 - usually knees- discussed voltaren alternate- may also help BP. Could use tylenol aas well  #  Hyperglycemia-on CBGs in the past but A1c's have been okay with max 5.4   Recommended follow up: Return in about 6 months (around 12/01/2020) for follow up- or sooner if needed.  Lab/Order associations: fasting   ICD-10-CM   1. Preventative health care  Z00.00   2. Mixed hyperlipidemia  E78.2   3. Hyperglycemia  R73.9   4. Screening for prostate cancer  Z12.5   5. Benign prostatic hyperplasia without lower urinary tract symptoms  N40.0     No orders of the defined types were placed in this encounter.   Return precautions advised.  [name], MD

## 2020-05-31 NOTE — Patient Instructions (Addendum)
Please stop by lab before you go If you have mychart- we will send your results within 3 business days of [place] receiving them.  If you do not have mychart- we will call you about results within 5 business days of [place] receiving them.  *please also note that you will see labs on mychart as soon as they post. I will later go in and write notes on them- will say "notes from Dr. Yong Channel"  Check blood pressure at home and if does not trend back down please let us know (but I strongly suspect it will- ongoing white coat issues)  [email or web address]  Would limit alcohol to 14 per week and try voltaren gel instead of aleve if you can for the knees  Health Maintenance Due  Topic Date Due  . INFLUENZA VACCINE - we opted to hold off this year with how cautious you are being and with how late in the season it is 11/25/2019  . PNA vac Low Risk Adult - pneumovax 23 today Never done   Recommended follow up: Return in about 6 months (around 12/01/2020) for follow up- or sooner if needed.

## 2020-06-03 ENCOUNTER — Ambulatory Visit (INDEPENDENT_AMBULATORY_CARE_PROVIDER_SITE_OTHER): Payer: Medicare Other | Admitting: Family Medicine

## 2020-06-03 ENCOUNTER — Other Ambulatory Visit: Payer: Self-pay

## 2020-06-03 ENCOUNTER — Encounter: Payer: Self-pay | Admitting: Family Medicine

## 2020-06-03 VITALS — BP 152/100 | HR 66 | Temp 98.5°F | Ht 73.0 in | Wt 198.2 lb

## 2020-06-03 DIAGNOSIS — Z0000 Encounter for general adult medical examination without abnormal findings: Secondary | ICD-10-CM

## 2020-06-03 DIAGNOSIS — Z23 Encounter for immunization: Secondary | ICD-10-CM | POA: Diagnosis not present

## 2020-06-03 DIAGNOSIS — E782 Mixed hyperlipidemia: Secondary | ICD-10-CM

## 2020-06-03 DIAGNOSIS — R739 Hyperglycemia, unspecified: Secondary | ICD-10-CM | POA: Diagnosis not present

## 2020-06-03 DIAGNOSIS — N400 Enlarged prostate without lower urinary tract symptoms: Secondary | ICD-10-CM

## 2020-06-03 DIAGNOSIS — Z125 Encounter for screening for malignant neoplasm of prostate: Secondary | ICD-10-CM | POA: Diagnosis not present

## 2020-06-03 LAB — CBC WITH DIFFERENTIAL/PLATELET
Basophils Absolute: 0 10*3/uL (ref 0.0–0.1)
Basophils Relative: 0.8 % (ref 0.0–3.0)
Eosinophils Absolute: 0.1 10*3/uL (ref 0.0–0.7)
Eosinophils Relative: 2.2 % (ref 0.0–5.0)
HCT: 49.2 % (ref 39.0–52.0)
Hemoglobin: 16.8 g/dL (ref 13.0–17.0)
Lymphocytes Relative: 19.5 % (ref 12.0–46.0)
Lymphs Abs: 1.1 10*3/uL (ref 0.7–4.0)
MCHC: 34.1 g/dL (ref 30.0–36.0)
MCV: 95.2 fl (ref 78.0–100.0)
Monocytes Absolute: 0.5 10*3/uL (ref 0.1–1.0)
Monocytes Relative: 10.1 % (ref 3.0–12.0)
Neutro Abs: 3.7 10*3/uL (ref 1.4–7.7)
Neutrophils Relative %: 67.4 % (ref 43.0–77.0)
Platelets: 137 10*3/uL — ABNORMAL LOW (ref 150.0–400.0)
RBC: 5.17 Mil/uL (ref 4.22–5.81)
RDW: 12.7 % (ref 11.5–15.5)
WBC: 5.4 10*3/uL (ref 4.0–10.5)

## 2020-06-03 LAB — POC URINALSYSI DIPSTICK (AUTOMATED)
Bilirubin, UA: NEGATIVE
Blood, UA: NEGATIVE
Glucose, UA: NEGATIVE
Ketones, UA: NEGATIVE
Leukocytes, UA: NEGATIVE
Nitrite, UA: NEGATIVE
Protein, UA: NEGATIVE
Spec Grav, UA: 1.02 (ref 1.010–1.025)
Urobilinogen, UA: 0.2 E.U./dL
pH, UA: 5 (ref 5.0–8.0)

## 2020-06-03 LAB — LIPID PANEL
Cholesterol: 148 mg/dL (ref 0–200)
HDL: 46 mg/dL (ref >39.00)
NonHDL: 102.37
Total CHOL/HDL Ratio: 3
Triglycerides: 286 mg/dL — ABNORMAL HIGH (ref 0.0–149.0)
VLDL: 57.2 mg/dL — ABNORMAL HIGH (ref 0.0–40.0)

## 2020-06-03 LAB — LDL CHOLESTEROL, DIRECT: Direct LDL: 51 mg/dL

## 2020-06-03 LAB — COMPREHENSIVE METABOLIC PANEL
ALT: 31 U/L (ref 0–53)
AST: 27 U/L (ref 0–37)
Albumin: 4.9 g/dL (ref 3.5–5.2)
Alkaline Phosphatase: 58 U/L (ref 39–117)
BUN: 14 mg/dL (ref 6–23)
CO2: 30 mEq/L (ref 19–32)
Calcium: 10.1 mg/dL (ref 8.4–10.5)
Chloride: 99 mEq/L (ref 96–112)
Creatinine, Ser: 1.01 mg/dL (ref 0.40–1.50)
GFR: 78.19 mL/min (ref >60.00)
Glucose, Bld: 104 mg/dL — ABNORMAL HIGH (ref 70–99)
Potassium: 4.2 mEq/L (ref 3.5–5.1)
Sodium: 137 mEq/L (ref 135–145)
Total Bilirubin: 1.4 mg/dL — ABNORMAL HIGH (ref 0.2–1.2)
Total Protein: 7.5 g/dL (ref 6.0–8.3)

## 2020-06-03 LAB — PSA: PSA: 1 ng/mL (ref 0.10–4.00)

## 2020-06-03 MED ORDER — EZETIMIBE-SIMVASTATIN 10-20 MG PO TABS: 1.0000 | ORAL_TABLET | Freq: Every day | ORAL | 3 refills | Status: DC

## 2020-06-03 MED ORDER — LISINOPRIL 10 MG PO TABS: 10.0000 mg | ORAL_TABLET | Freq: Every day | ORAL | 3 refills | Status: DC

## 2020-06-03 MED ORDER — SUMATRIPTAN SUCCINATE 100 MG PO TABS: 100.0000 mg | ORAL_TABLET | ORAL | 5 refills | Status: DC | PRN

## 2020-06-03 MED ORDER — BISOPROLOL-HYDROCHLOROTHIAZIDE 10-6.25 MG PO TABS: 1.0000 | ORAL_TABLET | Freq: Every day | ORAL | 3 refills | Status: DC

## 2020-06-03 NOTE — Addendum Note (Signed)
Addended by: Linton Ham on: 06/03/2020 11:40 AM   Modules accepted: Orders

## 2020-06-03 NOTE — Addendum Note (Signed)
Addended by: Adah Salvage F on: 06/03/2020 11:38 AM   Modules accepted: Orders

## 2020-07-03 ENCOUNTER — Encounter: Payer: Self-pay | Admitting: Family Medicine

## 2020-07-03 ENCOUNTER — Other Ambulatory Visit: Payer: Self-pay

## 2020-07-15 DIAGNOSIS — H5213 Myopia, bilateral: Secondary | ICD-10-CM | POA: Diagnosis not present

## 2020-08-10 ENCOUNTER — Other Ambulatory Visit: Payer: Self-pay | Admitting: Family Medicine

## 2020-10-13 ENCOUNTER — Encounter: Payer: Self-pay | Admitting: Family Medicine

## 2020-10-17 ENCOUNTER — Encounter: Payer: Self-pay | Admitting: Family Medicine

## 2020-11-03 ENCOUNTER — Encounter: Payer: Self-pay | Admitting: Family Medicine

## 2020-11-27 NOTE — Telephone Encounter (Signed)
Dr. Yong Channel, please see message. I have cancelled the appt for 12/01/2020. Please advise if pt needs to reschedule appt or just keep next appt for physical as scheduled 06/04/2021.

## 2020-12-01 ENCOUNTER — Ambulatory Visit: Payer: Medicare Other | Admitting: Family Medicine

## 2021-05-08 ENCOUNTER — Other Ambulatory Visit: Payer: Self-pay | Admitting: Family Medicine

## 2021-05-25 ENCOUNTER — Other Ambulatory Visit: Payer: Self-pay

## 2021-05-25 MED ORDER — BISOPROLOL-HYDROCHLOROTHIAZIDE 10-6.25 MG PO TABS: 1.0000 | ORAL_TABLET | Freq: Every day | ORAL | 3 refills | Status: DC

## 2021-05-27 NOTE — Progress Notes (Signed)
Phone: [phone]   Subjective:  Patient presents today for their annual physical. Chief complaint-noted.   See problem oriented charting- ROS- full  review of systems was completed and negative  per full ROS sheet- does mention some aches and pains intermittently  The following were reviewed and entered/updated in epic: Past Medical History:  Diagnosis Date   Allergy    Headache(784.0)    Hiatal hernia    Hyperlipidemia    Hypertension    Sinusitis    Patient Active Problem List   Diagnosis Date Noted   History of bacteremia 02/26/2018    Priority: High   UTI (urinary tract infection) 02/25/2018    Priority: High   History of adenomatous polyp of colon 08/15/2017    Priority: Medium    White coat syndrome with diagnosis of hypertension 12/09/2009    Priority: Medium    Hyperlipidemia 03/07/2007    Priority: Medium    Migraine 12/08/2006    Priority: Medium    Hyperglycemia 05/21/2016    Priority: Low   Thrombocytopenia ([place]) 05/21/2016    Priority: Low   Allergic rhinitis 01/04/2014    Priority: Low   Anxiety state, unspecified 05/25/2010    Priority: Low   Past Surgical History:  Procedure Laterality Date   COLONOSCOPY     int hems only- with stark    colonscopy     WISDOM TOOTH EXTRACTION      Family History  Problem Relation Age of Onset   Hypertension Mother    Aneurysm Mother    Glaucoma Father    Heart attack Father        age 32   Colon cancer Neg Hx    Colon polyps Neg Hx    Esophageal cancer Neg Hx    Rectal cancer Neg Hx    Stomach cancer Neg Hx     Medications- reviewed and updated Current Outpatient Medications  Medication Sig Dispense Refill   cetirizine (ZYRTEC) 5 MG tablet Take 5 mg by mouth daily.     fluticasone (FLONASE) 50 MCG/ACT nasal spray Place 1 spray into both nostrils daily.     multivitamin (THERAGRAN) per tablet Take 1 tablet by mouth daily.     SUMAtriptan (IMITREX) 100 MG tablet Take 1 tablet (100 mg total) by  mouth every 2 (two) hours as needed for migraine. May repeat in 2 hours if headache persists or recurs. 10 tablet 5   bisoprolol-hydrochlorothiazide (ZIAC) 10-6.25 MG tablet Take 1 tablet by mouth daily. 90 tablet 3   ezetimibe-simvastatin (VYTORIN) 10-20 MG tablet Take 1 tablet by mouth at bedtime. 90 tablet 3   lisinopril (ZESTRIL) 10 MG tablet TAKE 1 TABLET(10 MG) BY MOUTH DAILY 90 tablet 3   No current facility-administered medications for this visit.    Allergies-reviewed and updated Allergies  Allergen Reactions   Ambien [Zolpidem Tartrate]     Used during hospitalization- became delirious     Social History   Social History Narrative   Married 40 years in 2020 (married 1980). 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    Played baseball at [employer]. Wife went to [employer].       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 Oropharynx normal Neck: no thyromegaly CV: RRR no murmurs rubs or gallops Lungs: CTAB no crackles, wheeze, rhonchi Abdomen: soft/nontender/nondistended/normal bowel sounds. No rebound or guarding.  Ext: no edema Skin: warm, dry Neuro: grossly normal, moves all extremities, PERRLA    Assessment and Plan  66 y.o. male presenting for annual physical.  Health Maintenance counseling: 1. Anticipatory guidance: Patient counseled regarding regular dental exams -q6 months, eye exams -yearly ,  avoiding smoking and second hand smoke , limiting alcohol to 2 beverages per day-last year averaging 15-21 drinks per week and we have recommended limiting this to 14 max.   Around the same this year again- discussed cutting down again.  No illicit drugs.  2. Risk factor reduction:  Advised patient of need for regular exercise and diet rich and fruits and vegetables to reduce risk of heart attack and stroke.  Exercise-  lifts weights daily and getting his steps in- 10k steps- does steps while watching tv. also very active with golf and drums..  Diet/weight management--holidays with slight increase- trying to improve since that time.  weight up 2 lbs from last year- not bad but hed like to get back to 193 on home scales.  Wt Readings from Last 3 Encounters:  06/04/21 200 lb 12.8 oz (91.1 kg)  06/03/20 198 lb 3.2 oz (89.9 kg)  02/21/20 196 lb 12.8 oz (89.3 kg)  3. Immunizations/screenings/ancillary studies DISCUSSED:  -COVID booster vaccine #4- declines -Flu vaccine (last one 01/2019) - opts out as later in season -recommended prevnar 20- with current guidelines should be last pneumonia shot- wants to wait a few years Immunization History  Administered Date(s) Administered   Influenza Split 04/23/2011   Influenza Whole 01/03/2009   Influenza,inj,Quad PF,6+ Mos 01/04/2014, 01/22/2015, 05/25/2016, 05/13/2017, 04/04/2018, 02/20/2019   PFIZER(Purple Top)SARS-COV-2 Vaccination 07/01/2019, 08/10/2019, 01/29/2020   Pneumococcal Polysaccharide-23 06/03/2020   Td 04/26/2006   Tdap 05/25/2016   Zoster Recombinat (Shingrix) 05/29/2018, 09/26/2018  4. Prostate cancer screening-  low risk prior psa trend- monitor with labs. Bph on exam in past- defer unless psa trend concerning Lab Results  Component Value Date   PSA 1.00 06/03/2020   PSA 0.97 05/31/2019   PSA 2.01 05/29/2018   5. Colon cancer screening - 08/02/17 with 5 year repeat planned- history adenomatous polyps 6. Skin cancer screening- no dermatologist-advised regular sunscreen use. Denies worrisome, changing, or new skin lesions.  7. Smoking associated screening (lung cancer screening, AAA screen 65-75, UA)- Never  smoker 8. STD screening -only active with wife so does not need   Status of chronic or acute concerns   #Hypertension with white coat htn element S: Compliant with lisinopril 10 mg, bisoprolol hydrochlorothiazide 10-6.25 mg daily.  Home readings #s: 120/80 at home. Was 153/93 at dentist ut that is also anxiety provoking BP Readings from Last 3 Encounters:  06/04/21 120/80  06/03/20 (!) 152/100  02/21/20 122/80  A/P: Controlled on home checks- was high in office 172/100 and later 162/100 - he agrees to monitor at home at least once a month. Continue current medications.   #Hyperlipidemia S: Compliant with Vytorin 10-20 mg daily Lab Results  Component Value Date   CHOL 148 06/03/2020   HDL 46.00 06/03/2020   LDLCALC 53 04/12/2012   LDLDIRECT 51.0 06/03/2020   TRIG 286.0 (H)  06/03/2020   CHOLHDL 3 06/03/2020   A/P: reasonable control -discussed lower alcohol intake could help triglycerides  #Migraines S: Typical pattern is approximately 3-4 times a year. Sumatriptan 100 mg every two hours as needed was helpful= helps within 15 mins A/P: reasonably Controlled. Continue current medications.   #Allergic rhinitis S: zyrtec helpful around the time - side effects did not bother him in 2021- had in past. also on nasocort or flonase A/P: Controlled. Continue current medications.    #Thrombocytopenia- had thrombocytopenia for years. Stable on last check- trend with labs today. Other cell lines reassuring Lab Results  Component Value Date   WBC 5.4 06/03/2020   HGB 16.8 06/03/2020   HCT 49.2 06/03/2020   MCV 95.2 06/03/2020   PLT 137.0 (L) 06/03/2020   # History of bacteremia-UTI led to bacteremia in late 2019. Patient hospitalized at the time. No history of UTIs outside of this and presented with primarily nausea/vomiting. BPH likely contributed  #right ankle pain- issues since sports in hs and basketball. Has always had mild swelling in that ankle- some pain with golf. Has tried  compression sleeve or "ankle lock" which works well for him. If rests it he feels better  Recommended follow up: Return in about 6 months (around 12/02/2021) for follow up- or sooner if needed. He may stretch to yearly as well as long as checks home BP  Lab/Order associations: fasting   ICD-10-CM   1. Preventative health care  Z00.00     2. Essential hypertension  I10 CBC with Differential/Platelet    Comprehensive metabolic panel    Lipid panel    POCT Urinalysis Dipstick (Automated)    3. Mixed hyperlipidemia  E78.2 CBC with Differential/Platelet    Comprehensive metabolic panel    Lipid panel    POCT Urinalysis Dipstick (Automated)    4. Thrombocytopenia (HCC) Chronic D69.6 CBC with Differential/Platelet    5. Benign prostatic hyperplasia with urinary frequency  N40.1 PSA   R35.0      Meds ordered this encounter  Medications   bisoprolol-hydrochlorothiazide (ZIAC) 10-6.25 MG tablet    Sig: Take 1 tablet by mouth daily.    Dispense:  90 tablet    Refill:  3   ezetimibe-simvastatin (VYTORIN) 10-20 MG tablet    Sig: Take 1 tablet by mouth at bedtime.    Dispense:  90 tablet    Refill:  3   lisinopril (ZESTRIL) 10 MG tablet    Sig: TAKE 1 TABLET(10 MG) BY MOUTH DAILY    Dispense:  90 tablet    Refill:  3   I,Jada Bradford,acting as a scribe for [name], MD.,have documented all relevant documentation on the behalf of [name], MD,as directed by  [name], MD while in the presence of [name], MD.  I, [name], MD, have reviewed all documentation for this visit. The documentation on 06/04/21 for the exam, diagnosis, procedures, and orders are all accurate and complete.  Return precautions advised.  [name], MD

## 2021-05-27 NOTE — Patient Instructions (Addendum)
Prevnar 20 in the future  Please stop by lab before you go If you have mychart- we will send your results within 3 business days of [place] receiving them.  If you do not have mychart- we will call you about results within 5 business days of [place] receiving them.  *please also note that you will see labs on mychart as soon as they post. I will later go in and write notes on them- will say "notes from Dr. Yong Channel"   Recommended follow up: Return in about 6 months (around 12/02/2021) for follow up- or sooner if needed.

## 2021-06-04 ENCOUNTER — Ambulatory Visit (INDEPENDENT_AMBULATORY_CARE_PROVIDER_SITE_OTHER): Payer: Medicare Other | Admitting: Family Medicine

## 2021-06-04 ENCOUNTER — Encounter: Payer: Self-pay | Admitting: Family Medicine

## 2021-06-04 ENCOUNTER — Other Ambulatory Visit: Payer: Self-pay

## 2021-06-04 VITALS — BP 120/80 | HR 65 | Temp 98.1°F | Ht 73.0 in | Wt 200.8 lb

## 2021-06-04 DIAGNOSIS — Z0000 Encounter for general adult medical examination without abnormal findings: Secondary | ICD-10-CM

## 2021-06-04 DIAGNOSIS — I10 Essential (primary) hypertension: Secondary | ICD-10-CM

## 2021-06-04 DIAGNOSIS — N400 Enlarged prostate without lower urinary tract symptoms: Secondary | ICD-10-CM | POA: Diagnosis not present

## 2021-06-04 DIAGNOSIS — E782 Mixed hyperlipidemia: Secondary | ICD-10-CM

## 2021-06-04 DIAGNOSIS — D696 Thrombocytopenia, unspecified: Secondary | ICD-10-CM | POA: Diagnosis not present

## 2021-06-04 LAB — COMPREHENSIVE METABOLIC PANEL
ALT: 37 U/L (ref 0–53)
AST: 33 U/L (ref 0–37)
Albumin: 4.5 g/dL (ref 3.5–5.2)
Alkaline Phosphatase: 53 U/L (ref 39–117)
BUN: 15 mg/dL (ref 6–23)
CO2: 29 mEq/L (ref 19–32)
Calcium: 9.6 mg/dL (ref 8.4–10.5)
Chloride: 103 mEq/L (ref 96–112)
Creatinine, Ser: 1.08 mg/dL (ref 0.40–1.50)
GFR: 71.65 mL/min (ref >60.00)
Glucose, Bld: 108 mg/dL — ABNORMAL HIGH (ref 70–99)
Potassium: 4.4 mEq/L (ref 3.5–5.1)
Sodium: 141 mEq/L (ref 135–145)
Total Bilirubin: 1.2 mg/dL (ref 0.2–1.2)
Total Protein: 7 g/dL (ref 6.0–8.3)

## 2021-06-04 LAB — CBC WITH DIFFERENTIAL/PLATELET
Basophils Absolute: 0 10*3/uL (ref 0.0–0.1)
Basophils Relative: 0.9 % (ref 0.0–3.0)
Eosinophils Absolute: 0.1 10*3/uL (ref 0.0–0.7)
Eosinophils Relative: 1.4 % (ref 0.0–5.0)
HCT: 45.6 % (ref 39.0–52.0)
Hemoglobin: 15.4 g/dL (ref 13.0–17.0)
Lymphocytes Relative: 22.9 % (ref 12.0–46.0)
Lymphs Abs: 1.1 10*3/uL (ref 0.7–4.0)
MCHC: 33.7 g/dL (ref 30.0–36.0)
MCV: 95.1 fl (ref 78.0–100.0)
Monocytes Absolute: 0.5 10*3/uL (ref 0.1–1.0)
Monocytes Relative: 9.8 % (ref 3.0–12.0)
Neutro Abs: 3.1 10*3/uL (ref 1.4–7.7)
Neutrophils Relative %: 65 % (ref 43.0–77.0)
Platelets: 114 10*3/uL — ABNORMAL LOW (ref 150.0–400.0)
RBC: 4.8 Mil/uL (ref 4.22–5.81)
RDW: 12.8 % (ref 11.5–15.5)
WBC: 4.8 10*3/uL (ref 4.0–10.5)

## 2021-06-04 LAB — LIPID PANEL
Cholesterol: 151 mg/dL (ref 0–200)
HDL: 42.9 mg/dL (ref >39.00)
NonHDL: 107.7
Total CHOL/HDL Ratio: 4
Triglycerides: 321 mg/dL — ABNORMAL HIGH (ref 0.0–149.0)
VLDL: 64.2 mg/dL — ABNORMAL HIGH (ref 0.0–40.0)

## 2021-06-04 LAB — POC URINALSYSI DIPSTICK (AUTOMATED)
Bilirubin, UA: NEGATIVE
Blood, UA: NEGATIVE
Glucose, UA: NEGATIVE
Ketones, UA: NEGATIVE
Leukocytes, UA: NEGATIVE
Nitrite, UA: NEGATIVE
Protein, UA: NEGATIVE
Spec Grav, UA: >=1.03 — AB (ref 1.010–1.025)
Urobilinogen, UA: 0.2 E.U./dL
pH, UA: 6 (ref 5.0–8.0)

## 2021-06-04 LAB — PSA: PSA: 0.99 ng/mL (ref 0.10–4.00)

## 2021-06-04 LAB — LDL CHOLESTEROL, DIRECT: Direct LDL: 55 mg/dL

## 2021-06-04 MED ORDER — LISINOPRIL 10 MG PO TABS: ORAL_TABLET | ORAL | 3 refills | Status: DC

## 2021-06-04 MED ORDER — EZETIMIBE-SIMVASTATIN 10-20 MG PO TABS: 1.0000 | ORAL_TABLET | Freq: Every day | ORAL | 3 refills | Status: DC

## 2021-06-04 MED ORDER — BISOPROLOL-HYDROCHLOROTHIAZIDE 10-6.25 MG PO TABS: 1.0000 | ORAL_TABLET | Freq: Every day | ORAL | 3 refills | Status: DC

## 2021-06-05 ENCOUNTER — Encounter: Payer: Self-pay | Admitting: Family Medicine

## 2021-06-09 ENCOUNTER — Ambulatory Visit (INDEPENDENT_AMBULATORY_CARE_PROVIDER_SITE_OTHER): Payer: Medicare Other

## 2021-06-09 ENCOUNTER — Other Ambulatory Visit: Payer: Self-pay

## 2021-06-09 DIAGNOSIS — Z0000 Encounter for general adult medical examination without abnormal findings: Secondary | ICD-10-CM

## 2021-06-09 NOTE — Patient Instructions (Addendum)
Frank Joseph , Thank you for taking time to come for your Medicare Wellness Visit. I appreciate your ongoing commitment to your health goals. Please review the following plan we discussed and let me know if I can assist you in the future.   Screening recommendations/referrals: Colonoscopy: Done 08/02/17 repeat every 5 years  Recommended yearly ophthalmology/optometry visit for glaucoma screening and checkup Recommended yearly dental visit for hygiene and checkup  Vaccinations: Influenza vaccine: Declined at this time Pneumococcal vaccine: Due since 06/03/21 Tdap vaccine: Done 05/25/16 repeat every 10 years  Shingles vaccine: Completed 2/3, 09/26/18   Covid-19: Completed 3/7, 4/16, & 01/29/20  Advanced directives: Please bring a copy of your health care power of attorney and living will to the office at your convenience.  Conditions/risks identified: Reduce tea/beer and maintain diet to lose weight and lower Triglycerides  Next appointment: Follow up in one year for your annual wellness visit.   Preventive Care 102 Years and Older, Male Preventive care refers to lifestyle choices and visits with your health care provider that can promote health and wellness. What does preventive care include? A yearly physical exam. This is also called an annual well check. Dental exams once or twice a year. Routine eye exams. Ask your health care provider how often you should have your eyes checked. Personal lifestyle choices, including: Daily care of your teeth and gums. Regular physical activity. Eating a healthy diet. Avoiding tobacco and drug use. Limiting alcohol use. Practicing safe sex. Taking low doses of aspirin every day. Taking vitamin and mineral supplements as recommended by your health care provider. What happens during an annual well check? The services and screenings done by your health care provider during your annual well check will depend on your age, overall health, lifestyle risk  factors, and family history of disease. Counseling  Your health care provider may ask you questions about your: Alcohol use. Tobacco use. Drug use. Emotional well-being. Home and relationship well-being. Sexual activity. Eating habits. History of falls. Memory and ability to understand (cognition). Work and work Statistician. Screening  You may have the following tests or measurements: Height, weight, and BMI. Blood pressure. Lipid and cholesterol levels. These may be checked every 5 years, or more frequently if you are over 52 years old. Skin check. Lung cancer screening. You may have this screening every year starting at age 67 if you have a 30-pack-year history of smoking and currently smoke or have quit within the past 15 years. Fecal occult blood test (FOBT) of the stool. You may have this test every year starting at age 59. Flexible sigmoidoscopy or colonoscopy. You may have a sigmoidoscopy every 5 years or a colonoscopy every 10 years starting at age 3. Prostate cancer screening. Recommendations will vary depending on your family history and other risks. Hepatitis C blood test. Hepatitis B blood test. Sexually transmitted disease (STD) testing. Diabetes screening. This is done by checking your blood sugar (glucose) after you have not eaten for a while (fasting). You may have this done every 1-3 years. Abdominal aortic aneurysm (AAA) screening. You may need this if you are a current or former smoker. Osteoporosis. You may be screened starting at age 26 if you are at high risk. Talk with your health care provider about your test results, treatment options, and if necessary, the need for more tests. Vaccines  Your health care provider may recommend certain vaccines, such as: Influenza vaccine. This is recommended every year. Tetanus, diphtheria, and acellular pertussis (Tdap, Td) vaccine. You  may need a Td booster every 10 years. Zoster vaccine. You may need this after age  28. Pneumococcal 13-valent conjugate (PCV13) vaccine. One dose is recommended after age 27. Pneumococcal polysaccharide (PPSV23) vaccine. One dose is recommended after age 43. Talk to your health care provider about which screenings and vaccines you need and how often you need them. This information is not intended to replace advice given to you by your health care provider. Make sure you discuss any questions you have with your health care provider. Document Released: 05/09/2015 Document Revised: 12/31/2015 Document Reviewed: 02/11/2015 Elsevier Interactive Patient Education  2017 [place] Prevention in the Home Falls can cause injuries. They can happen to people of all ages. There are many things you can do to make your home safe and to help prevent falls. What can I do on the outside of my home? Regularly fix the edges of walkways and driveways and fix any cracks. Remove anything that might make you trip as you walk through a door, such as a raised step or threshold. Trim any bushes or trees on the path to your home. Use bright outdoor lighting. Clear any walking paths of anything that might make someone trip, such as rocks or tools. Regularly check to see if handrails are loose or broken. Make sure that both sides of any steps have handrails. Any raised decks and porches should have guardrails on the edges. Have any leaves, snow, or ice cleared regularly. Use sand or salt on walking paths during winter. Clean up any spills in your garage right away. This includes oil or grease spills. What can I do in the bathroom? Use night lights. Install grab bars by the toilet and in the tub and shower. Do not use towel bars as grab bars. Use non-skid mats or decals in the tub or shower. If you need to sit down in the shower, use a plastic, non-slip stool. Keep the floor dry. Clean up any water that spills on the floor as soon as it happens. Remove soap buildup in the tub or shower  regularly. Attach bath mats securely with double-sided non-slip rug tape. Do not have throw rugs and other things on the floor that can make you trip. What can I do in the bedroom? Use night lights. Make sure that you have a light by your bed that is easy to reach. Do not use any sheets or blankets that are too big for your bed. They should not hang down onto the floor. Have a firm chair that has side arms. You can use this for support while you get dressed. Do not have throw rugs and other things on the floor that can make you trip. What can I do in the kitchen? Clean up any spills right away. Avoid walking on wet floors. Keep items that you use a lot in easy-to-reach places. If you need to reach something above you, use a strong step stool that has a grab bar. Keep electrical cords out of the way. Do not use floor polish or wax that makes floors slippery. If you must use wax, use non-skid floor wax. Do not have throw rugs and other things on the floor that can make you trip. What can I do with my stairs? Do not leave any items on the stairs. Make sure that there are handrails on both sides of the stairs and use them. Fix handrails that are broken or loose. Make sure that handrails are as long as the  stairways. Check any carpeting to make sure that it is firmly attached to the stairs. Fix any carpet that is loose or worn. Avoid having throw rugs at the top or bottom of the stairs. If you do have throw rugs, attach them to the floor with carpet tape. Make sure that you have a light switch at the top of the stairs and the bottom of the stairs. If you do not have them, ask someone to add them for you. What else can I do to help prevent falls? Wear shoes that: Do not have high heels. Have rubber bottoms. Are comfortable and fit you well. Are closed at the toe. Do not wear sandals. If you use a stepladder: Make sure that it is fully opened. Do not climb a closed stepladder. Make sure that  both sides of the stepladder are locked into place. Ask someone to hold it for you, if possible. Clearly mark and make sure that you can see: Any grab bars or handrails. First and last steps. Where the edge of each step is. Use tools that help you move around (mobility aids) if they are needed. These include: Canes. Walkers. Scooters. Crutches. Turn on the lights when you go into a dark area. Replace any light bulbs as soon as they burn out. Set up your furniture so you have a clear path. Avoid moving your furniture around. If any of your floors are uneven, fix them. If there are any pets around you, be aware of where they are. Review your medicines with your doctor. Some medicines can make you feel dizzy. This can increase your chance of falling. Ask your doctor what other things that you can do to help prevent falls. This information is not intended to replace advice given to you by your health care provider. Make sure you discuss any questions you have with your health care provider. Document Released: 02/06/2009 Document Revised: 09/18/2015 Document Reviewed: 05/17/2014 Elsevier Interactive Patient Education  2017 [employer].

## 2021-06-09 NOTE — Progress Notes (Signed)
Virtual Visit via Telephone Note  I connected with  Frank Joseph. on 06/09/21 at  8:00 AM EST by telephone and verified that I am speaking with the correct person using two identifiers.  Medicare Annual Wellness visit completed telephonically due to Covid-19 pandemic.   Persons participating in this call: This Health Coach and this patient.   Location: Patient: Home Provider: Office   I discussed the limitations, risks, security and privacy concerns of performing an evaluation and management service by telephone and the availability of in person appointments. The patient expressed understanding and agreed to proceed.  Unable to perform video visit due to video visit attempted and failed and/or patient does not have video capability.   Some vital signs may be absent or patient reported.   Willette Brace, LPN   Subjective:   Frank Joseph. is a 66 y.o. male who presents for an Initial Medicare Annual Wellness Visit.  Review of Systems     Cardiac Risk Factors include: advanced age (>5men, >93 women);male gender;dyslipidemia;hypertension     Objective:    There were no vitals filed for this visit. There is no height or weight on file to calculate BMI.  Advanced Directives 06/09/2021 02/25/2018 02/25/2018 08/02/2017 07/19/2017  Does Patient Have a Medical Advance Directive? Yes Yes No;Yes Yes Yes  Type of Advance Directive Healthcare Power of [place] of facility DNR (pink MOST or yellow form);Living will;Healthcare Power of [place];Living will  Does patient want to make changes to medical advance directive? - No - Patient declined - - -  Copy of [place] in Chart? No - copy requested No - copy requested - - -    Current Medications (verified) Outpatient Encounter Medications as of 06/09/2021  Medication Sig   bisoprolol-hydrochlorothiazide (ZIAC) 10-6.25 MG tablet Take 1 tablet by  mouth daily.   cetirizine (ZYRTEC) 5 MG tablet Take 5 mg by mouth daily. 1/2 tab as needed   ezetimibe-simvastatin (VYTORIN) 10-20 MG tablet Take 1 tablet by mouth at bedtime.   lisinopril (ZESTRIL) 10 MG tablet TAKE 1 TABLET(10 MG) BY MOUTH DAILY   multivitamin (THERAGRAN) per tablet Take 1 tablet by mouth daily.   triamcinolone (NASACORT ALLERGY 24HR) 55 MCG/ACT AERO nasal inhaler Place 2 sprays into the nose daily.   [DISCONTINUED] fluticasone (FLONASE) 50 MCG/ACT nasal spray Place 1 spray into both nostrils daily.   SUMAtriptan (IMITREX) 100 MG tablet Take 1 tablet (100 mg total) by mouth every 2 (two) hours as needed for migraine. May repeat in 2 hours if headache persists or recurs. (Patient not taking: Reported on 06/09/2021)   No facility-administered encounter medications on file as of 06/09/2021.    Allergies (verified) Ambien [zolpidem tartrate]   History: Past Medical History:  Diagnosis Date   Allergy    Headache(784.0)    Hiatal hernia    Hyperlipidemia    Hypertension    Sinusitis    Past Surgical History:  Procedure Laterality Date   COLONOSCOPY     int hems only- with stark    colonscopy     WISDOM TOOTH EXTRACTION     Family History  Problem Relation Age of Onset   Hypertension Mother    Aneurysm Mother    Glaucoma Father    Heart attack Father        age 15   Colon cancer Neg Hx    Colon polyps Neg Hx  Esophageal cancer Neg Hx    Rectal cancer Neg Hx    Stomach cancer Neg Hx    Social History   Socioeconomic History   Marital status: Married    Spouse name: Not on file   Number of children: Not on file   Years of education: Not on file   Highest education level: Not on file  Occupational History   Not on file  Tobacco Use   Smoking status: Never   Smokeless tobacco: Never  Vaping Use   Vaping Use: Never used  Substance and Sexual Activity   Alcohol use: Yes    Comment: 2-24   Drug use: No   Sexual activity: Not on file  Other Topics  Concern   Not on file  Social History Narrative   Married 40 years in 2020 (married 1980). 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    Played baseball at [facility]. Wife went to [facility] [employer].       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[employer], Financial trader      Hobbies: golf (Dispensing optician), drums   Social Determinants of Health   Financial Resource Strain: Low Risk    Difficulty of Paying Living Expenses: Not hard at all  Food Insecurity: No Food Insecurity   Worried About Charity fundraiser in the Last Year: Never true   Arboriculturist in the Last Year: Never true  Transportation Needs: No Transportation Needs   Lack of Transportation (Medical): No   Lack of Transportation (Non-Medical): No  Physical Activity: Sufficiently Active   Days of Exercise per Week: 7 days   Minutes of Exercise per Session: 120 min  Stress: No Stress Concern Present   Feeling of Stress : Not at all  Social Connections: Socially Integrated   Frequency of Communication with Friends and Family: More than three times a week   Frequency of Social Gatherings with Friends and Family: More than three times a week   Attends Religious Services: More than 4 times per year   Active Member of [employer] or Organizations: Yes   Attends Archivist Meetings: 1 to 4 times per year   Marital Status: Married    Tobacco Counseling Counseling given: Not Answered   Clinical Intake:  Pre-visit preparation completed: Yes  Pain : No/denies pain     BMI - recorded: 26.49 Nutritional Status: BMI 25 -29 Overweight Nutritional Risks: None Diabetes: No  How often do you need to have someone help you when you read instructions, pamphlets, or other written materials from your doctor or pharmacy?: 1 - Never  Diabetic?No  Interpreter Needed?: No  Information entered by :: [name], LPN   Activities of Daily  Living In your present state of health, do you have any difficulty performing the following activities: 06/09/2021  Hearing? N  Vision? N  Difficulty concentrating or making decisions? N  Walking or climbing stairs? N  Dressing or bathing? N  Doing errands, shopping? N  Preparing Food and eating ? N  Using the Toilet? N  In the past six months, have you accidently leaked urine? N  Do you have problems with loss of bowel control? N  Managing your Medications? N  Managing your Finances? N  Housekeeping or managing your Housekeeping? N  Some recent data might be hidden    Patient Care Team: Marin Olp, MD as PCP - General (Family Medicine) [place],  Altha Harm, MD as Consulting Physician (Ophthalmology) Hurman Horn, DMD as Consulting Physician (Dentistry)  Indicate any recent Medical Services you may have received from other than [facility] providers in the past year (date may be approximate).     Assessment:   This is a routine wellness examination for [place].  Hearing/Vision screen Hearing Screening - Comments:: Pt denies any hearing issues  Vision Screening - Comments:: Pt follows up with [facility] opthalmology for annual eye exams   Dietary issues and exercise activities discussed: Current Exercise Habits: Home exercise routine, Type of exercise: Other - see comments, Time (Minutes): > 60, Frequency (Times/Week): 7, Weekly Exercise (Minutes/Week): 0   Goals Addressed             This Visit's Progress    Patient Stated       Lose weight and work on cholesterol        Depression Screen PHQ 2/9 Scores 06/09/2021 06/04/2021 06/03/2020 11/29/2019 05/31/2019 05/29/2018 05/13/2017  PHQ - 2 Score 0 0 0 0 0 0 0  PHQ- 9 Score - 0 - 0 0 0 -    Fall Risk Fall Risk  06/09/2021 06/04/2021 06/03/2020 05/31/2019 05/29/2018  Falls in the past year? 0 0 0 0 0  Number falls in past yr: 0 0 0 0 0  Injury with Fall? 0 0 0 0 0  Risk for fall due to : Impaired vision - - - -  Follow up  Falls prevention discussed - - - -    FALL RISK PREVENTION PERTAINING TO THE HOME:  Any stairs in or around the home? Yes  If so, are there any without handrails? No  Home free of loose throw rugs in walkways, pet beds, electrical cords, etc? Yes  Adequate lighting in your home to reduce risk of falls? Yes   ASSISTIVE DEVICES UTILIZED TO PREVENT FALLS:  Life alert? No  Use of a cane, walker or w/c? No  Grab bars in the bathroom? No  Shower chair or bench in shower? No  Elevated toilet seat or a handicapped toilet? No   TIMED UP AND GO:  Was the test performed? No .   Cognitive Function:     6CIT Screen 06/09/2021  What Year? 0 points  What month? 0 points  What time? 0 points  Count back from 20 0 points  Months in reverse 0 points  Repeat phrase 0 points  Total Score 0    Immunizations Immunization History  Administered Date(s) Administered   Influenza Split 04/23/2011   Influenza Whole 01/03/2009   Influenza,inj,Quad PF,6+ Mos 01/04/2014, 01/22/2015, 05/25/2016, 05/13/2017, 04/04/2018, 02/20/2019   PFIZER(Purple Top)SARS-COV-2 Vaccination 07/01/2019, 08/10/2019, 01/29/2020   Pneumococcal Polysaccharide-23 06/03/2020   Td 04/26/2006   Tdap 05/25/2016   Zoster Recombinat (Shingrix) 05/29/2018, 09/26/2018    TDAP status: Up to date  Flu Vaccine status: Declined, Education has been provided regarding the importance of this vaccine but patient still declined. Advised may receive this vaccine at local pharmacy or Health Dept. Aware to provide a copy of the vaccination record if obtained from local pharmacy or Health Dept. Verbalized acceptance and understanding.  Pneumococcal vaccine status: Due, Education has been provided regarding the importance of this vaccine. Advised may receive this vaccine at local pharmacy or Health Dept. Aware to provide a copy of the vaccination record if obtained from local pharmacy or Health Dept. Verbalized acceptance and  understanding.  Covid-19 vaccine status: Completed vaccines  Qualifies for Shingles Vaccine? Yes   Zostavax completed Yes  Shingrix Completed?: Yes  Screening Tests Health Maintenance  Topic Date Due   Pneumonia Vaccine 16+ Years old (2 - PCV) 06/03/2021   COVID-19 Vaccine (4 - Booster for Pfizer series) 06/20/2021 (Originally 03/25/2020)   INFLUENZA VACCINE  07/24/2021 (Originally 11/24/2020)   COLONOSCOPY (Pts 45-63yrs Insurance coverage will need to be confirmed)  08/03/2022   TETANUS/TDAP  05/25/2026   Hepatitis C Screening  Completed   Zoster Vaccines- Shingrix  Completed   HPV VACCINES  Aged Out    Health Maintenance  Health Maintenance Due  Topic Date Due   Pneumonia Vaccine 86+ Years old (2 - PCV) 06/03/2021    Colorectal cancer screening: Type of screening: Colonoscopy. Completed 08/02/17. Repeat every 5 years   Additional Screening:  Hepatitis C Screening: Completed 05/27/17  Vision Screening: Recommended annual ophthalmology exams for early detection of glaucoma and other disorders of the eye. Is the patient up to date with their annual eye exam?  Yes  Who is the provider or what is the name of the office in which the patient attends annual eye exams? [facility] opthalmology  If pt is not established with a provider, would they like to be referred to a provider to establish care? No .   Dental Screening: Recommended annual dental exams for proper oral hygiene  Community Resource Referral / Chronic Care Management: CRR required this visit?  No   CCM required this visit?  No      Plan:     I have personally reviewed and noted the following in the patients chart:   Medical and social history Use of alcohol, tobacco or illicit drugs  Current medications and supplements including opioid prescriptions. Patient is not currently taking opioid prescriptions. Functional ability and status Nutritional status Physical activity Advanced directives List of other  physicians Hospitalizations, surgeries, and ER visits in previous 12 months Vitals Screenings to include cognitive, depression, and falls Referrals and appointments  In addition, I have reviewed and discussed with patient certain preventive protocols, quality metrics, and best practice recommendations. A written personalized care plan for preventive services as well as general preventive health recommendations were provided to patient.     Willette Brace, LPN   6/65/9935   Nurse Notes: None

## 2021-07-28 DIAGNOSIS — H5213 Myopia, bilateral: Secondary | ICD-10-CM | POA: Diagnosis not present

## 2021-08-06 ENCOUNTER — Encounter: Payer: Self-pay | Admitting: Family Medicine

## 2021-08-06 NOTE — Telephone Encounter (Signed)
See below

## 2021-12-01 ENCOUNTER — Ambulatory Visit: Payer: Medicare Other | Admitting: Family Medicine

## 2022-01-07 ENCOUNTER — Ambulatory Visit (INDEPENDENT_AMBULATORY_CARE_PROVIDER_SITE_OTHER): Payer: Medicare Other | Admitting: Family Medicine

## 2022-01-07 ENCOUNTER — Encounter: Payer: Self-pay | Admitting: Family Medicine

## 2022-01-07 VITALS — BP 125/76 | HR 61 | Temp 98.1°F | Ht 73.0 in | Wt 203.2 lb

## 2022-01-07 DIAGNOSIS — I10 Essential (primary) hypertension: Secondary | ICD-10-CM

## 2022-01-07 DIAGNOSIS — E782 Mixed hyperlipidemia: Secondary | ICD-10-CM

## 2022-01-07 DIAGNOSIS — Z79899 Other long term (current) drug therapy: Secondary | ICD-10-CM

## 2022-01-07 LAB — COMPREHENSIVE METABOLIC PANEL
ALT: 43 U/L (ref 0–53)
AST: 35 U/L (ref 0–37)
Albumin: 4.1 g/dL (ref 3.5–5.2)
Alkaline Phosphatase: 40 U/L (ref 39–117)
BUN: 16 mg/dL (ref 6–23)
CO2: 30 mEq/L (ref 19–32)
Calcium: 9.7 mg/dL (ref 8.4–10.5)
Chloride: 102 mEq/L (ref 96–112)
Creatinine, Ser: 1.01 mg/dL (ref 0.40–1.50)
GFR: 77.32 mL/min (ref >60.00)
Glucose, Bld: 88 mg/dL (ref 70–99)
Potassium: 4.1 mEq/L (ref 3.5–5.1)
Sodium: 140 mEq/L (ref 135–145)
Total Bilirubin: 1.1 mg/dL (ref 0.2–1.2)
Total Protein: 6.8 g/dL (ref 6.0–8.3)

## 2022-01-07 LAB — CBC WITH DIFFERENTIAL/PLATELET
Basophils Absolute: 0.1 10*3/uL (ref 0.0–0.1)
Basophils Relative: 0.9 % (ref 0.0–3.0)
Eosinophils Absolute: 0.1 10*3/uL (ref 0.0–0.7)
Eosinophils Relative: 1.4 % (ref 0.0–5.0)
HCT: 43.4 % (ref 39.0–52.0)
Hemoglobin: 15.1 g/dL (ref 13.0–17.0)
Lymphocytes Relative: 19.5 % (ref 12.0–46.0)
Lymphs Abs: 1.1 10*3/uL (ref 0.7–4.0)
MCHC: 34.8 g/dL (ref 30.0–36.0)
MCV: 100.6 fl — ABNORMAL HIGH (ref 78.0–100.0)
Monocytes Absolute: 0.6 10*3/uL (ref 0.1–1.0)
Monocytes Relative: 11.1 % (ref 3.0–12.0)
Neutro Abs: 3.7 10*3/uL (ref 1.4–7.7)
Neutrophils Relative %: 67.1 % (ref 43.0–77.0)
Platelets: 117 10*3/uL — ABNORMAL LOW (ref 150.0–400.0)
RBC: 4.32 Mil/uL (ref 4.22–5.81)
RDW: 13.1 % (ref 11.5–15.5)
WBC: 5.5 10*3/uL (ref 4.0–10.5)

## 2022-01-07 LAB — HEMOGLOBIN A1C: Hgb A1c MFr Bld: 5.7 % (ref 4.6–6.5)

## 2022-01-07 NOTE — Patient Instructions (Addendum)
Please stop by lab before you go If you have mychart- we will send your results within 3 business days of [place] receiving them.  If you do not have mychart- we will call you about results within 5 business days of [place] receiving them.  *please also note that you will see labs on mychart as soon as they post. I will later go in and write notes on them- will say "notes from Dr. Yong Channel"   Discontinue tapee tea as we discussed  Recommended follow up: Return for next already scheduled visit or sooner if needed.

## 2022-01-07 NOTE — Progress Notes (Signed)
Phone [phone] In person visit   Subjective:   Frank Joseph. is a 66 y.o. year old very pleasant male patient who presents for/with See problem oriented charting Chief Complaint  Patient presents with   Rash    Pt c/o rash in arm pits that he has been using benadryl and cortisone and it is getting better.   Hypertension   herbal suppliments    Wants to discuss Tappe Tea.   Past Medical History-  Patient Active Problem List   Diagnosis Date Noted   History of bacteremia 02/26/2018    Priority: High   UTI (urinary tract infection) 02/25/2018    Priority: High   History of adenomatous polyp of colon 08/15/2017    Priority: Medium    White coat syndrome with diagnosis of hypertension 12/09/2009    Priority: Medium    Hyperlipidemia 03/07/2007    Priority: Medium    Migraine 12/08/2006    Priority: Medium    Hyperglycemia 05/21/2016    Priority: Low   Thrombocytopenia ([place]) 05/21/2016    Priority: Low   Allergic rhinitis 01/04/2014    Priority: Low   Anxiety state, unspecified 05/25/2010    Priority: Low    Medications- reviewed and updated Current Outpatient Medications  Medication Sig Dispense Refill   bisoprolol-hydrochlorothiazide (ZIAC) 10-6.25 MG tablet Take 1 tablet by mouth daily. 90 tablet 3   ezetimibe-simvastatin (VYTORIN) 10-20 MG tablet Take 1 tablet by mouth at bedtime. 90 tablet 3   lisinopril (ZESTRIL) 10 MG tablet TAKE 1 TABLET(10 MG) BY MOUTH DAILY 90 tablet 3   multivitamin (THERAGRAN) per tablet Take 1 tablet by mouth daily.     SUMAtriptan (IMITREX) 100 MG tablet Take 1 tablet (100 mg total) by mouth every 2 (two) hours as needed for migraine. May repeat in 2 hours if headache persists or recurs. 10 tablet 5   triamcinolone (NASACORT ALLERGY 24HR) 55 MCG/ACT AERO nasal inhaler Place 2 sprays into the nose daily.     No current facility-administered medications for this visit.     Objective:  BP 125/76   Pulse 61   Temp 98.1 F (36.7  C)   Ht '6\' 1"'$  (1.854 m)   Wt 203 lb 3.2 oz (92.2 kg)   SpO2 98%   BMI 26.81 kg/m  Gen: NAD, resting comfortably CV: RRR no murmurs rubs or gallops Lungs: CTAB no crackles, wheeze, rhonchi Ext: no edema Skin: warm, dry    Assessment and Plan   #Rash S:reports rash in arm pits starting after Saturday morning yardwork and by Sunday or Monday notices (was working 95 degree weather and there had been some spray a few months ago- benadryl and cortisone cream seem to be helping.  -also trying tide release- was spraying armpits with this ROS-not ill appearing, no fever/chills. No new medications. Not immunocompromised. No mucus membrane involvement.  A/P: Rash sounds like possibly heat or contact related- improving- continue current medicine OTC- glad it is improving- does not appear to be fungal. Not itching much- cortisone alone likely adequate   #Hypertension S: Compliant with lisinopril 10 mg, bisoprolol hydrochlorothiazide 10-6.25 mg.  Home blood pressure monitoring- 130/79 average over last 100 readings. Most recent reading 125/76. Verified home cuff today- his and my reading both around 170- went up once I came in- was lower with nursing 120/94 BP Readings from Last 3 Encounters:  01/07/22 125/76  06/04/21 120/80  06/03/20 (!) 152/100  A/P: Controlled very well at home. Continue current medications. White  coat element in office.   #plantar fasciitis/tapee tea questions- took tapee tea starting in may- seemed to really help in 3 days- weaned to half pack and then for last week quarter pack.  -reviewed FDA Warnings and advised to permanently discontinue- did recommend 1/8th a pack a day for another week then stop -discussed risk of adrenal suppression and warning signs   #Hyperlipidemia S: Compliant with Vytorin 10-20 mg Lab Results  Component Value Date   CHOL 151 06/04/2021   HDL 42.90 06/04/2021   LDLCALC 53 04/12/2012   LDLDIRECT 55.0 06/04/2021   TRIG 321.0 (H) 06/04/2021    CHOLHDL 4 06/04/2021  A/P: excellent control- continue current meds   Recommended follow up: Return for next already scheduled visit or sooner if needed. Future Appointments  Date Time Provider [place]  06/08/2022  8:20 AM Marin Olp, MD [facility]   Lab/Order associations: not FASTING   ICD-10-CM   1. White coat syndrome with diagnosis of hypertension  I10 CBC with Differential/Platelet    Comprehensive metabolic panel    2. Mixed hyperlipidemia  E78.2 CBC with Differential/Platelet    Comprehensive metabolic panel    3. High risk medication use  Z79.899 Hemoglobin A1c     No orders of the defined types were placed in this encounter.   Return precautions advised.  [name], MD

## 2022-01-08 ENCOUNTER — Encounter: Payer: Self-pay | Admitting: Family Medicine

## 2022-01-12 ENCOUNTER — Encounter: Payer: Self-pay | Admitting: Family Medicine

## 2022-01-12 MED ORDER — TRIAMCINOLONE ACETONIDE 0.1 % EX CREA: 1.0000 | TOPICAL_CREAM | Freq: Two times a day (BID) | CUTANEOUS | 0 refills | Status: DC

## 2022-03-23 ENCOUNTER — Encounter: Payer: Self-pay | Admitting: Family Medicine

## 2022-05-18 DIAGNOSIS — K080 Exfoliation of teeth due to systemic causes: Secondary | ICD-10-CM | POA: Diagnosis not present

## 2022-06-08 ENCOUNTER — Encounter: Payer: Self-pay | Admitting: Family Medicine

## 2022-06-08 ENCOUNTER — Ambulatory Visit (INDEPENDENT_AMBULATORY_CARE_PROVIDER_SITE_OTHER): Payer: Medicare Other | Admitting: Family Medicine

## 2022-06-08 VITALS — BP 133/80 | HR 72 | Temp 97.3°F | Ht 73.0 in | Wt 199.6 lb

## 2022-06-08 DIAGNOSIS — E782 Mixed hyperlipidemia: Secondary | ICD-10-CM

## 2022-06-08 DIAGNOSIS — Z0000 Encounter for general adult medical examination without abnormal findings: Secondary | ICD-10-CM

## 2022-06-08 DIAGNOSIS — I10 Essential (primary) hypertension: Secondary | ICD-10-CM

## 2022-06-08 DIAGNOSIS — R739 Hyperglycemia, unspecified: Secondary | ICD-10-CM

## 2022-06-08 DIAGNOSIS — N401 Enlarged prostate with lower urinary tract symptoms: Secondary | ICD-10-CM | POA: Diagnosis not present

## 2022-06-08 DIAGNOSIS — D696 Thrombocytopenia, unspecified: Secondary | ICD-10-CM | POA: Diagnosis not present

## 2022-06-08 DIAGNOSIS — R350 Frequency of micturition: Secondary | ICD-10-CM

## 2022-06-08 LAB — LIPID PANEL
Cholesterol: 151 mg/dL (ref 0–200)
HDL: 43.6 mg/dL (ref >39.00)
NonHDL: 107.66
Total CHOL/HDL Ratio: 3
Triglycerides: 308 mg/dL — ABNORMAL HIGH (ref 0.0–149.0)
VLDL: 61.6 mg/dL — ABNORMAL HIGH (ref 0.0–40.0)

## 2022-06-08 LAB — HEMOGLOBIN A1C: Hgb A1c MFr Bld: 5.5 % (ref 4.6–6.5)

## 2022-06-08 LAB — URINALYSIS, ROUTINE W REFLEX MICROSCOPIC
Bilirubin Urine: NEGATIVE
Hgb urine dipstick: NEGATIVE
Ketones, ur: NEGATIVE
Leukocytes,Ua: NEGATIVE
Nitrite: NEGATIVE
RBC / HPF: NONE SEEN (ref 0–?)
Specific Gravity, Urine: 1.025 (ref 1.000–1.030)
Total Protein, Urine: NEGATIVE
Urine Glucose: NEGATIVE
Urobilinogen, UA: 0.2 (ref 0.0–1.0)
WBC, UA: NONE SEEN (ref 0–?)
pH: 6 (ref 5.0–8.0)

## 2022-06-08 LAB — CBC WITH DIFFERENTIAL/PLATELET
Basophils Absolute: 0 10*3/uL (ref 0.0–0.1)
Basophils Relative: 0.9 % (ref 0.0–3.0)
Eosinophils Absolute: 0.1 10*3/uL (ref 0.0–0.7)
Eosinophils Relative: 2 % (ref 0.0–5.0)
HCT: 49.1 % (ref 39.0–52.0)
Hemoglobin: 16.6 g/dL (ref 13.0–17.0)
Lymphocytes Relative: 22.2 % (ref 12.0–46.0)
Lymphs Abs: 1 10*3/uL (ref 0.7–4.0)
MCHC: 33.9 g/dL (ref 30.0–36.0)
MCV: 92.3 fl (ref 78.0–100.0)
Monocytes Absolute: 0.5 10*3/uL (ref 0.1–1.0)
Monocytes Relative: 10.4 % (ref 3.0–12.0)
Neutro Abs: 2.9 10*3/uL (ref 1.4–7.7)
Neutrophils Relative %: 64.5 % (ref 43.0–77.0)
Platelets: 134 10*3/uL — ABNORMAL LOW (ref 150.0–400.0)
RBC: 5.32 Mil/uL (ref 4.22–5.81)
RDW: 13.3 % (ref 11.5–15.5)
WBC: 4.6 10*3/uL (ref 4.0–10.5)

## 2022-06-08 LAB — LDL CHOLESTEROL, DIRECT: Direct LDL: 61 mg/dL

## 2022-06-08 LAB — COMPREHENSIVE METABOLIC PANEL
ALT: 25 U/L (ref 0–53)
AST: 23 U/L (ref 0–37)
Albumin: 4.5 g/dL (ref 3.5–5.2)
Alkaline Phosphatase: 59 U/L (ref 39–117)
BUN: 16 mg/dL (ref 6–23)
CO2: 29 mEq/L (ref 19–32)
Calcium: 10.1 mg/dL (ref 8.4–10.5)
Chloride: 99 mEq/L (ref 96–112)
Creatinine, Ser: 1.09 mg/dL (ref 0.40–1.50)
GFR: 70.36 mL/min (ref >60.00)
Glucose, Bld: 114 mg/dL — ABNORMAL HIGH (ref 70–99)
Potassium: 4.2 mEq/L (ref 3.5–5.1)
Sodium: 137 mEq/L (ref 135–145)
Total Bilirubin: 1.2 mg/dL (ref 0.2–1.2)
Total Protein: 6.7 g/dL (ref 6.0–8.3)

## 2022-06-08 LAB — PSA: PSA: 1.03 ng/mL (ref 0.10–4.00)

## 2022-06-08 MED ORDER — BISOPROLOL-HYDROCHLOROTHIAZIDE 10-6.25 MG PO TABS: 1.0000 | ORAL_TABLET | Freq: Every day | ORAL | 3 refills | Status: DC

## 2022-06-08 MED ORDER — EZETIMIBE-SIMVASTATIN 10-20 MG PO TABS: 1.0000 | ORAL_TABLET | Freq: Every day | ORAL | 3 refills | Status: DC

## 2022-06-08 MED ORDER — SUMATRIPTAN SUCCINATE 100 MG PO TABS: 100.0000 mg | ORAL_TABLET | ORAL | 5 refills | Status: DC | PRN

## 2022-06-08 MED ORDER — LISINOPRIL 10 MG PO TABS: ORAL_TABLET | ORAL | 3 refills | Status: DC

## 2022-06-08 NOTE — Progress Notes (Signed)
Phone: [phone]   Subjective:  Patient presents today for their annual physical. Chief complaint-noted.   See problem oriented charting- ROS- full  review of systems was completed and negative  except for: intermittent orthopedic aches and pains including ankle, ganglion cyst on left wrist  The following were reviewed and entered/updated in epic: Past Medical History:  Diagnosis Date   Allergy    Headache(784.0)    Hiatal hernia    Hyperlipidemia    Hypertension    Sinusitis    Patient Active Problem List   Diagnosis Date Noted   History of bacteremia 02/26/2018    Priority: High   UTI (urinary tract infection) 02/25/2018    Priority: High   History of adenomatous polyp of colon 08/15/2017    Priority: Medium    White coat syndrome with diagnosis of hypertension 12/09/2009    Priority: Medium    Hyperlipidemia 03/07/2007    Priority: Medium    Migraine 12/08/2006    Priority: Medium    Hyperglycemia 05/21/2016    Priority: Low   Thrombocytopenia ([place]) 05/21/2016    Priority: Low   Allergic rhinitis 01/04/2014    Priority: Low   Anxiety state, unspecified 05/25/2010    Priority: Low   Past Surgical History:  Procedure Laterality Date   COLONOSCOPY     int hems only- with stark    colonscopy     WISDOM TOOTH EXTRACTION      Family History  Problem Relation Age of Onset   Hypertension Mother    Aneurysm Mother    Glaucoma Father    Heart attack Father        age 70   Colon cancer Neg Hx    Colon polyps Neg Hx    Esophageal cancer Neg Hx    Rectal cancer Neg Hx    Stomach cancer Neg Hx     Medications- reviewed and updated Current Outpatient Medications  Medication Sig Dispense Refill   multivitamin (THERAGRAN) per tablet Take 1 tablet by mouth daily.     triamcinolone (NASACORT ALLERGY 24HR) 55 MCG/ACT AERO nasal inhaler Place 2 sprays into the nose daily.     triamcinolone cream (KENALOG) 0.1 % Apply 1 Application topically 2 (two) times daily.  For 7-10 days maximum 80 g 0   bisoprolol-hydrochlorothiazide (ZIAC) 10-6.25 MG tablet Take 1 tablet by mouth daily. 90 tablet 3   ezetimibe-simvastatin (VYTORIN) 10-20 MG tablet Take 1 tablet by mouth at bedtime. 90 tablet 3   lisinopril (ZESTRIL) 10 MG tablet TAKE 1 TABLET(10 MG) BY MOUTH DAILY 90 tablet 3   SUMAtriptan (IMITREX) 100 MG tablet Take 1 tablet (100 mg total) by mouth every 2 (two) hours as needed for migraine. May repeat in 2 hours if headache persists or recurs. 10 tablet 5   No current facility-administered medications for this visit.    Allergies-reviewed and updated Allergies  Allergen Reactions   Ambien [Zolpidem Tartrate]     Used during hospitalization- became delirious     Social History   Social History Narrative   Married 40 years in 2020 (married 1980). 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 3 grandkids.    Played baseball at [employer]. Wife went to [employer].       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 Oropharynx normal Neck: no thyromegaly CV: RRR no murmurs rubs or gallops Lungs: CTAB no crackles, wheeze, rhonchi Abdomen: soft/nontender/nondistended/normal bowel sounds. No rebound or guarding.  Ext: no edema Skin: warm, dry Neuro: grossly normal, moves all extremities, PERRLA   Assessment and Plan  67 y.o. male presenting for annual physical.  Health Maintenance counseling: 1. Anticipatory guidance: Patient counseled regarding regular dental exams -q6 months, eye exams -yearly,  avoiding smoking and second hand smoke,  limiting alcohol to 2 beverages per day -down to right at 14 a week - some improvement- better for liver health, no illicit drugs   2. Risk factor reduction:  Advised patient of need for regular exercise and diet rich and fruits and vegetables to reduce risk of heart attack and stroke.  Exercise- active daily with exercise- wants to be stronger this year from weight lifting standpoint, still playing golf and drumming.  Diet/weight management-down 1 lb from last year- down 4 lbs from September- reasonably healthy.  Wt Readings from Last 3 Encounters:  06/08/22 199 lb 9.6 oz (90.5 kg)  01/07/22 203 lb 3.2 oz (92.2 kg)  06/04/21 200 lb 12.8 oz (91.1 kg)  3. Immunizations/screenings/ancillary studies- declines covid  , prevnar 20- wants to wait until 2027 or 2032  Immunization History  Administered Date(s) Administered   Influenza Split 04/23/2011   Influenza Whole 01/03/2009   Influenza,inj,Quad PF,6+ Mos 01/04/2014, 01/22/2015, 05/25/2016, 05/13/2017, 04/04/2018, 02/20/2019, 01/08/2022   PFIZER(Purple Top)SARS-COV-2 Vaccination 07/01/2019, 08/10/2019, 01/29/2020   Pneumococcal Polysaccharide-23 06/03/2020   Td 04/26/2006   Tdap 05/25/2016   Zoster Recombinat (Shingrix) 05/29/2018, 09/26/2018   4. Prostate cancer screening-  prior low risk PSA trend- update trend-BPH on prior exam- defer exam but check PSA Lab Results  Component Value Date   PSA 0.99 06/04/2021   PSA 1.00 06/03/2020   PSA 0.97 05/31/2019   5. Colon cancer screening - 08/02/17 with 5 year repeat planned- history adenomatous polyps due in next few months- he should be on lookout for letter 6. Skin cancer screening- no dermatologist-advised regular sunscreen use. Denies worrisome, changing, or new skin lesions.   7. Smoking associated screening (lung cancer screening, AAA screen 65-75, UA)- Never smoker  8. STD screening -only active with wife so does not need    Status of chronic or acute concerns    #Hypertension S:  Compliant with lisinopril 10 mg, bisoprolol hydrochlorothiazide 10-6.25 mg.   Home blood pressure monitoring- 133/80 over last 100 readings  BP Readings from Last 3 Encounters:  06/08/22 133/80  01/07/22 125/76  06/04/21 120/80  A/P: reasonable control at home- continue current medications    #Hyperlipidemia S: Compliant with Vytorin 10-20 mg Lab Results  Component Value Date   CHOL 151 06/04/2021   HDL 42.90 06/04/2021   LDLCALC 53 04/12/2012   LDLDIRECT 55.0 06/04/2021   TRIG 321.0 (H) 06/04/2021   CHOLHDL 4 06/04/2021  A/P: excellent control other than triglycerides- we will update today- unlikely to change meds unless worsening . Cutting back on alcohol would help triglycerides  #Migraines S: Typical pattern is approximately 3 times a year.  Sumatriptan 100 mg is helpful. -has done well- 1 in November and tolerable- sumatriptan helped A/P: reasonable control- continue current  medications    #Allergic rhinitis S: zyrtec helpful recently - had to increase to full tablet.  also on nasocort A/P: improved lately- ok now back o nhalf tablet zyrtec   #Thrombocytopenia- has had thrombocytopenia for years.  We will trend this today.  % History of bacteremia-UTI led to bacteremia in late 2019.  Patient hospitalized at the time.  No history of UTIs outside of this and presented with primarily nausea/vomiting.  BPH likely contributed  Recommended follow up: Return in about 6 months (around 12/07/2022) for followup or sooner if needed.Schedule b4 you leave.  Lab/Order associations: fasting   ICD-10-CM   1. Preventative health care  Z00.00     2. Thrombocytopenia (HCC) Chronic D69.6 CBC with Differential/Platelet    3. Mixed hyperlipidemia  E78.2 CBC with Differential/Platelet    Comprehensive metabolic panel    Lipid panel    Urinalysis, Routine w reflex microscopic    4. White coat syndrome with diagnosis of hypertension  I10 Urinalysis, Routine w reflex microscopic    5.  Hyperglycemia  R73.9 HgB A1c    6. Benign prostatic hyperplasia with urinary frequency  N40.1 PSA   R35.0       Meds ordered this encounter  Medications   SUMAtriptan (IMITREX) 100 MG tablet    Sig: Take 1 tablet (100 mg total) by mouth every 2 (two) hours as needed for migraine. May repeat in 2 hours if headache persists or recurs.    Dispense:  10 tablet    Refill:  5   lisinopril (ZESTRIL) 10 MG tablet    Sig: TAKE 1 TABLET(10 MG) BY MOUTH DAILY    Dispense:  90 tablet    Refill:  3   ezetimibe-simvastatin (VYTORIN) 10-20 MG tablet    Sig: Take 1 tablet by mouth at bedtime.    Dispense:  90 tablet    Refill:  3   bisoprolol-hydrochlorothiazide (ZIAC) 10-6.25 MG tablet    Sig: Take 1 tablet by mouth daily.    Dispense:  90 tablet    Refill:  3    Return precautions advised.  [name], MD

## 2022-06-08 NOTE — Patient Instructions (Addendum)
You are eligible to schedule your annual wellness visit with our nurse specialist Otila Kluver.  Please consider scheduling this before you leave today  Please stop by lab before you go If you have mychart- we will send your results within 3 business days of [place] receiving them.  If you do not have mychart- we will call you about results within 5 business days of [place] receiving them.  *please also note that you will see labs on mychart as soon as they post. I will later go in and write notes on them- will say "notes from Dr. Yong Channel"   Recommended follow up: Return in about 6 months (around 12/07/2022) for followup or sooner if needed.Schedule b4 you leave.

## 2022-06-17 ENCOUNTER — Telehealth: Payer: Self-pay

## 2022-06-17 NOTE — Telephone Encounter (Signed)
Called patient to schedule Medicare Annual Wellness Visit (AWV). No voicemail available to leave a message.  Last date of AWV: 06/09/21  Please schedule an appointment at any time with [place].  Norton Blizzard, [place] ([facility])  [place] [facility] [phone]

## 2022-07-08 ENCOUNTER — Encounter: Payer: Self-pay | Admitting: Gastroenterology

## 2022-07-19 ENCOUNTER — Telehealth: Payer: Self-pay | Admitting: Family Medicine

## 2022-07-19 NOTE — Telephone Encounter (Signed)
Contacted [name] to schedule their annual wellness visit. Appointment made for 07/22/2022.  [place] Direct Dial [phone]

## 2022-07-22 ENCOUNTER — Ambulatory Visit (INDEPENDENT_AMBULATORY_CARE_PROVIDER_SITE_OTHER): Payer: Medicare Other

## 2022-07-22 VITALS — Wt 199.0 lb

## 2022-07-22 DIAGNOSIS — Z0000 Encounter for general adult medical examination without abnormal findings: Secondary | ICD-10-CM | POA: Diagnosis not present

## 2022-07-22 NOTE — Progress Notes (Addendum)
I connected with  Frank Conners. on 07/22/22 by a audio enabled telemedicine application and verified that I am speaking with the correct person using two identifiers.  Patient Location: Home  Provider Location: Office/Clinic  I discussed the limitations of evaluation and management by telemedicine. The patient expressed understanding and agreed to proceed.   Subjective:   Frank Schlotter. is a 67 y.o. male who presents for Medicare Annual/Subsequent preventive examination.  Review of Systems     Cardiac Risk Factors include: advanced age (>46men, >65 women);male gender;dyslipidemia;hypertension     Objective:    Today's Vitals   07/22/22 1526  Weight: 199 lb (90.3 kg)   Body mass index is 26.25 kg/m.     07/22/2022    3:31 PM 06/09/2021    8:09 AM 02/25/2018    5:20 AM 02/25/2018   12:53 AM 08/02/2017   10:05 AM 07/19/2017    9:10 AM  Advanced Directives  Does Patient Have a Medical Advance Directive? Yes Yes Yes No;Yes Yes Yes  Type of Paramedic of [place];Living will Healthcare Power of [place] of facility DNR (pink MOST or yellow form);Living will;Healthcare Power of [place];Living will  Does patient want to make changes to medical advance directive?   No - Patient declined     Copy of [employer] [employer] in Chart? No - copy requested No - copy requested No - copy requested       Current Medications (verified) Outpatient Encounter Medications as of 07/22/2022  Medication Sig   bisoprolol-hydrochlorothiazide (ZIAC) 10-6.25 MG tablet Take 1 tablet by mouth daily.   ezetimibe-simvastatin (VYTORIN) 10-20 MG tablet Take 1 tablet by mouth at bedtime.   lisinopril (ZESTRIL) 10 MG tablet TAKE 1 TABLET(10 MG) BY MOUTH DAILY   multivitamin (THERAGRAN) per tablet Take 1 tablet by mouth daily.   SUMAtriptan (IMITREX) 100 MG tablet Take 1 tablet (100 mg total) by mouth every  2 (two) hours as needed for migraine. May repeat in 2 hours if headache persists or recurs.   triamcinolone (NASACORT ALLERGY 24HR) 55 MCG/ACT AERO nasal inhaler Place 2 sprays into the nose daily.   [DISCONTINUED] triamcinolone cream (KENALOG) 0.1 % Apply 1 Application topically 2 (two) times daily. For 7-10 days maximum   No facility-administered encounter medications on file as of 07/22/2022.    Allergies (verified) Ambien [zolpidem tartrate]   History: Past Medical History:  Diagnosis Date   Allergy    Headache(784.0)    Hiatal hernia    Hyperlipidemia    Hypertension    Sinusitis    Past Surgical History:  Procedure Laterality Date   COLONOSCOPY     int hems only- with stark    colonscopy     WISDOM TOOTH EXTRACTION     Family History  Problem Relation Age of Onset   Hypertension Mother    Aneurysm Mother    Glaucoma Father    Heart attack Father        age 61   Colon cancer Neg Hx    Colon polyps Neg Hx    Esophageal cancer Neg Hx    Rectal cancer Neg Hx    Stomach cancer Neg Hx    Social History   Socioeconomic History   Marital status: Married    Spouse name: Not on file   Number of children: Not on file   Years of education: Not on file   Highest  education level: Not on file  Occupational History   Not on file  Tobacco Use   Smoking status: Never   Smokeless tobacco: Never  Vaping Use   Vaping Use: Never used  Substance and Sexual Activity   Alcohol use: Yes    Comment: 2-24   Drug use: No   Sexual activity: Not on file  Other Topics Concern   Not on file  Social History Narrative   Married 40 years in 2020 (married 1980). 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 3 grandkids.    Played baseball at [employer]. Wife went to [employer].       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[employer], Financial trader      Hobbies: golf (Dispensing optician), drums   Social  Determinants of Health   Financial Resource Strain: Low Risk  (07/22/2022)   Overall Financial Resource Strain (CARDIA)    Difficulty of Paying Living Expenses: Not hard at all  Food Insecurity: No Food Insecurity (07/22/2022)   Hunger Vital Sign    Worried About Running Out of Food in the Last Year: Never true    [place] in the Last Year: Never true  Transportation Needs: No Transportation Needs (07/22/2022)   PRAPARE - Hydrologist (Medical): No    Lack of Transportation (Non-Medical): No  Physical Activity: Sufficiently Active (07/22/2022)   Exercise Vital Sign    Days of Exercise per Week: 7 days    Minutes of Exercise per Session: 120 min  Stress: No Stress Concern Present (07/22/2022)   [place]    Feeling of Stress : Not at all  Social Connections: Unknown (07/22/2022)   Social Connection and Isolation Panel [NHANES]    Frequency of Communication with Friends and Family: More than three times a week    Frequency of Social Gatherings with Friends and Family: More than three times a week    Attends Religious Services: Not on Advertising copywriter or Organizations: Yes    Attends Archivist Meetings: 1 to 4 times per year    Marital Status: Married    Tobacco Counseling Counseling given: Not Answered   Clinical Intake:  Pre-visit preparation completed: Yes  Pain : No/denies pain     BMI - recorded: 26.25 Nutritional Status: BMI 25 -29 Overweight Diabetes: No  How often do you need to have someone help you when you read instructions, pamphlets, or other written materials from your doctor or pharmacy?: 1 - Never  Diabetic?no  Interpreter Needed?: No  Information entered by :: [name], LPN   Activities of Daily Living    07/22/2022    3:31 PM  In your present state of health, do you have any difficulty performing the following  activities:  Hearing? 0  Vision? 0  Difficulty concentrating or making decisions? 0  Walking or climbing stairs? 0  Dressing or bathing? 0  Doing errands, shopping? 0  Preparing Food and eating ? N  Using the Toilet? N  In the past six months, have you accidently leaked urine? N  Do you have problems with loss of bowel control? N  Managing your Medications? N  Managing your Finances? N  Housekeeping or managing your Housekeeping? N    Patient Care Team: Marin Olp, MD as PCP - General (Family Medicine) Luberta Mutter,  MD as Consulting Physician (Ophthalmology) Hurman Horn, DMD as Consulting Physician (Dentistry)  Indicate any recent Medical Services you may have received from other than [facility] providers in the past year (date may be approximate).     Assessment:   This is a routine wellness examination for Frank Joseph.  Hearing/Vision screen Hearing Screening - Comments:: Pt denies any hearing issues  Vision Screening - Comments:: Pt follows up with [facility] for annual eye exams   Dietary issues and exercise activities discussed: Current Exercise Habits: Home exercise routine, Type of exercise: walking;Other - see comments, Time (Minutes): > 60, Frequency (Times/Week): 7, Weekly Exercise (Minutes/Week): 0   Goals Addressed             This Visit's Progress    Patient Stated       Continue to reduce triglycerides        Depression Screen    07/22/2022    3:30 PM 06/08/2022    8:12 AM 06/09/2021    8:08 AM 06/04/2021    8:01 AM 06/03/2020   10:33 AM 11/29/2019    9:27 AM 05/31/2019    8:10 AM  PHQ 2/9 Scores  PHQ - 2 Score 0 0 0 0 0 0 0  PHQ- 9 Score 0 0  0  0 0    Fall Risk    07/22/2022    3:31 PM 06/08/2022    8:12 AM 06/09/2021    8:10 AM 06/04/2021    8:01 AM 06/03/2020   10:33 AM  [place] in the past year? 0 0 0 0 0  Number falls in past yr: 0 0 0 0 0  Injury with Fall? 0 0 0 0 0  Risk for fall due to : Impaired vision  No Fall Risks Impaired vision    Follow up Falls prevention discussed Falls evaluation completed Falls prevention discussed      FALL RISK PREVENTION PERTAINING TO THE HOME:  Any stairs in or around the home? Yes  If so, are there any without handrails? No  Home free of loose throw rugs in walkways, pet beds, electrical cords, etc? Yes  Adequate lighting in your home to reduce risk of falls? Yes   ASSISTIVE DEVICES UTILIZED TO PREVENT FALLS:  Life alert? No  Use of a cane, walker or w/c? No  Grab bars in the bathroom? No  Shower chair or bench in shower? No  Elevated toilet seat or a handicapped toilet? No   TIMED UP AND GO:  Was the test performed? No .   Cognitive Function:        07/22/2022    3:32 PM 06/09/2021    8:13 AM  6CIT Screen  What Year? 0 points 0 points  What month? 0 points 0 points  What time? 0 points 0 points  Count back from 20 0 points 0 points  Months in reverse 0 points 0 points  Repeat phrase 0 points 0 points  Total Score 0 points 0 points    Immunizations Immunization History  Administered Date(s) Administered   Influenza Split 04/23/2011   Influenza Whole 01/03/2009   Influenza,inj,Quad PF,6+ Mos 01/04/2014, 01/22/2015, 05/25/2016, 05/13/2017, 04/04/2018, 02/20/2019, 01/08/2022   PFIZER(Purple Top)SARS-COV-2 Vaccination 07/01/2019, 08/10/2019, 01/29/2020   Pneumococcal Polysaccharide-23 06/03/2020   Td 04/26/2006   Tdap 05/25/2016   Zoster Recombinat (Shingrix) 05/29/2018, 09/26/2018    TDAP status: Up to date  Flu Vaccine status: Up to date  Pneumococcal vaccine status: Due, Education has been  provided regarding the importance of this vaccine. Advised may receive this vaccine at local pharmacy or Health Dept. Aware to provide a copy of the vaccination record if obtained from local pharmacy or Health Dept. Verbalized acceptance and understanding.  Covid-19 vaccine status: Completed vaccines  Qualifies for Shingles Vaccine? Yes    Zostavax completed Yes   Shingrix Completed?: Yes  Screening Tests Health Maintenance  Topic Date Due   COVID-19 Vaccine (4 - 2023-24 season) 12/25/2021   Pneumonia Vaccine 27+ Years old (2 of 2 - PCV) 01/08/2023 (Originally 06/03/2021)   COLONOSCOPY (Pts 45-86yrs Insurance coverage will need to be confirmed)  08/03/2022   Medicare Annual Wellness (AWV)  07/22/2023   DTaP/Tdap/Td (3 - Td or Tdap) 05/25/2026   INFLUENZA VACCINE  Completed   Hepatitis C Screening  Completed   Zoster Vaccines- Shingrix  Completed   HPV VACCINES  Aged Out    Health Maintenance  Health Maintenance Due  Topic Date Due   COVID-19 Vaccine (4 - 2023-24 season) 12/25/2021    Colorectal cancer screening: Type of screening: Colonoscopy. Completed 08/02/17. Repeat every 5 years    Additional Screening:  Hepatitis C Screening: Completed 05/27/17  Vision Screening: Recommended annual ophthalmology exams for early detection of glaucoma and other disorders of the eye. Is the patient up to date with their annual eye exam?  Yes  Who is the provider or what is the name of the office in which the patient attends annual eye exams? [facility]  If pt is not established with a provider, would they like to be referred to a provider to establish care? no.   Dental Screening: Recommended annual dental exams for proper oral hygiene  Community Resource Referral / Chronic Care Management: CRR required this visit?  No   CCM required this visit?  No      Plan:     I have personally reviewed and noted the following in the patient's chart:   Medical and social history Use of alcohol, tobacco or illicit drugs  Current medications and supplements including opioid prescriptions. Patient is not currently taking opioid prescriptions. Functional ability and status Nutritional status Physical activity Advanced directives List of other physicians Hospitalizations, surgeries, and ER visits in previous 12  months Vitals Screenings to include cognitive, depression, and falls Referrals and appointments  In addition, I have reviewed and discussed with patient certain preventive protocols, quality metrics, and best practice recommendations. A written personalized care plan for preventive services as well as general preventive health recommendations were provided to patient.     Willette Brace, LPN   [id number]   Nurse Notes: none

## 2022-07-22 NOTE — Patient Instructions (Signed)
Frank Joseph , Thank you for taking time to come for your Medicare Wellness Visit. I appreciate your ongoing commitment to your health goals. Please review the following plan we discussed and let me know if I can assist you in the future.   These are the goals we discussed:  Goals      Patient Stated     Lose weight and work on triglycerides      Patient Stated     Continue to reduce triglycerides         This is a list of the screening recommended for you and due dates:  Health Maintenance  Topic Date Due   COVID-19 Vaccine (4 - 2023-24 season) 12/25/2021   Pneumonia Vaccine (2 of 2 - PCV) 01/08/2023*   Colon Cancer Screening  08/03/2022   Medicare Annual Wellness Visit  07/22/2023   DTaP/Tdap/Td vaccine (3 - Td or Tdap) 05/25/2026   Flu Shot  Completed   Hepatitis C Screening: USPSTF Recommendation to screen - Ages 18-79 yo.  Completed   Zoster (Shingles) Vaccine  Completed   HPV Vaccine  Aged Out  *Topic was postponed. The date shown is not the original due date.    Advanced directives: Please bring a copy of your health care power of attorney and living will to the office at your convenience.  Conditions/risks identified: decrease triglycerides   Next appointment: Follow up in one year for your annual wellness visit.   Preventive Care 16 Years and Older, Male  Preventive care refers to lifestyle choices and visits with your health care provider that can promote health and wellness. What does preventive care include? A yearly physical exam. This is also called an annual well check. Dental exams once or twice a year. Routine eye exams. Ask your health care provider how often you should have your eyes checked. Personal lifestyle choices, including: Daily care of your teeth and gums. Regular physical activity. Eating a healthy diet. Avoiding tobacco and drug use. Limiting alcohol use. Practicing safe sex. Taking low doses of aspirin every day. Taking vitamin and  mineral supplements as recommended by your health care provider. What happens during an annual well check? The services and screenings done by your health care provider during your annual well check will depend on your age, overall health, lifestyle risk factors, and family history of disease. Counseling  Your health care provider may ask you questions about your: Alcohol use. Tobacco use. Drug use. Emotional well-being. Home and relationship well-being. Sexual activity. Eating habits. History of falls. Memory and ability to understand (cognition). Work and work Statistician. Screening  You may have the following tests or measurements: Height, weight, and BMI. Blood pressure. Lipid and cholesterol levels. These may be checked every 5 years, or more frequently if you are over 17 years old. Skin check. Lung cancer screening. You may have this screening every year starting at age 30 if you have a 30-pack-year history of smoking and currently smoke or have quit within the past 15 years. Fecal occult blood test (FOBT) of the stool. You may have this test every year starting at age 63. Flexible sigmoidoscopy or colonoscopy. You may have a sigmoidoscopy every 5 years or a colonoscopy every 10 years starting at age 89. Prostate cancer screening. Recommendations will vary depending on your family history and other risks. Hepatitis C blood test. Hepatitis B blood test. Sexually transmitted disease (STD) testing. Diabetes screening. This is done by checking your blood sugar (glucose) after you have not  eaten for a while (fasting). You may have this done every 1-3 years. Abdominal aortic aneurysm (AAA) screening. You may need this if you are a current or former smoker. Osteoporosis. You may be screened starting at age 47 if you are at high risk. Talk with your health care provider about your test results, treatment options, and if necessary, the need for more tests. Vaccines  Your health care  provider may recommend certain vaccines, such as: Influenza vaccine. This is recommended every year. Tetanus, diphtheria, and acellular pertussis (Tdap, Td) vaccine. You may need a Td booster every 10 years. Zoster vaccine. You may need this after age 100. Pneumococcal 13-valent conjugate (PCV13) vaccine. One dose is recommended after age 76. Pneumococcal polysaccharide (PPSV23) vaccine. One dose is recommended after age 81. Talk to your health care provider about which screenings and vaccines you need and how often you need them. This information is not intended to replace advice given to you by your health care provider. Make sure you discuss any questions you have with your health care provider. Document Released: 05/09/2015 Document Revised: 12/31/2015 Document Reviewed: 02/11/2015 Elsevier Interactive Patient Education  2017 [place] Prevention in the Home Falls can cause injuries. They can happen to people of all ages. There are many things you can do to make your home safe and to help prevent falls. What can I do on the outside of my home? Regularly fix the edges of walkways and driveways and fix any cracks. Remove anything that might make you trip as you walk through a door, such as a raised step or threshold. Trim any bushes or trees on the path to your home. Use bright outdoor lighting. Clear any walking paths of anything that might make someone trip, such as rocks or tools. Regularly check to see if handrails are loose or broken. Make sure that both sides of any steps have handrails. Any raised decks and porches should have guardrails on the edges. Have any leaves, snow, or ice cleared regularly. Use sand or salt on walking paths during winter. Clean up any spills in your garage right away. This includes oil or grease spills. What can I do in the bathroom? Use night lights. Install grab bars by the toilet and in the tub and shower. Do not use towel bars as grab  bars. Use non-skid mats or decals in the tub or shower. If you need to sit down in the shower, use a plastic, non-slip stool. Keep the floor dry. Clean up any water that spills on the floor as soon as it happens. Remove soap buildup in the tub or shower regularly. Attach bath mats securely with double-sided non-slip rug tape. Do not have throw rugs and other things on the floor that can make you trip. What can I do in the bedroom? Use night lights. Make sure that you have a light by your bed that is easy to reach. Do not use any sheets or blankets that are too big for your bed. They should not hang down onto the floor. Have a firm chair that has side arms. You can use this for support while you get dressed. Do not have throw rugs and other things on the floor that can make you trip. What can I do in the kitchen? Clean up any spills right away. Avoid walking on wet floors. Keep items that you use a lot in easy-to-reach places. If you need to reach something above you, use a strong step stool that  has a grab bar. Keep electrical cords out of the way. Do not use floor polish or wax that makes floors slippery. If you must use wax, use non-skid floor wax. Do not have throw rugs and other things on the floor that can make you trip. What can I do with my stairs? Do not leave any items on the stairs. Make sure that there are handrails on both sides of the stairs and use them. Fix handrails that are broken or loose. Make sure that handrails are as long as the stairways. Check any carpeting to make sure that it is firmly attached to the stairs. Fix any carpet that is loose or worn. Avoid having throw rugs at the top or bottom of the stairs. If you do have throw rugs, attach them to the floor with carpet tape. Make sure that you have a light switch at the top of the stairs and the bottom of the stairs. If you do not have them, ask someone to add them for you. What else can I do to help prevent  falls? Wear shoes that: Do not have high heels. Have rubber bottoms. Are comfortable and fit you well. Are closed at the toe. Do not wear sandals. If you use a stepladder: Make sure that it is fully opened. Do not climb a closed stepladder. Make sure that both sides of the stepladder are locked into place. Ask someone to hold it for you, if possible. Clearly mark and make sure that you can see: Any grab bars or handrails. First and last steps. Where the edge of each step is. Use tools that help you move around (mobility aids) if they are needed. These include: Canes. Walkers. Scooters. Crutches. Turn on the lights when you go into a dark area. Replace any light bulbs as soon as they burn out. Set up your furniture so you have a clear path. Avoid moving your furniture around. If any of your floors are uneven, fix them. If there are any pets around you, be aware of where they are. Review your medicines with your doctor. Some medicines can make you feel dizzy. This can increase your chance of falling. Ask your doctor what other things that you can do to help prevent falls. This information is not intended to replace advice given to you by your health care provider. Make sure you discuss any questions you have with your health care provider. Document Released: 02/06/2009 Document Revised: 09/18/2015 Document Reviewed: 05/17/2014 Elsevier Interactive Patient Education  2017 [employer].

## 2022-08-10 ENCOUNTER — Ambulatory Visit (INDEPENDENT_AMBULATORY_CARE_PROVIDER_SITE_OTHER): Payer: Medicare Other | Admitting: Family Medicine

## 2022-08-10 VITALS — BP 180/100 | HR 70 | Temp 98.6°F | Ht 73.0 in | Wt 199.6 lb

## 2022-08-10 DIAGNOSIS — I10 Essential (primary) hypertension: Secondary | ICD-10-CM

## 2022-08-10 DIAGNOSIS — J301 Allergic rhinitis due to pollen: Secondary | ICD-10-CM

## 2022-08-10 MED ORDER — AZELASTINE HCL 0.1 % NA SOLN: 1.0000 | Freq: Two times a day (BID) | NASAL | 5 refills | Status: DC

## 2022-08-10 NOTE — Patient Instructions (Signed)
Please follow up if symptoms do not improve or as needed.    Allergic Rhinitis, Adult  Allergic rhinitis is an allergic reaction that affects the mucous membrane inside the nose. The mucous membrane is the tissue that produces mucus. There are two types of allergic rhinitis: Seasonal. This type is also called hay fever and happens only during certain seasons. Perennial. This type can happen at any time of the year. Allergic rhinitis cannot be spread from person to person. This condition can be mild, bad, or very bad. It can develop at any age and may be outgrown. What are the causes? This condition is caused by allergens. These are things that can cause an allergic reaction. Allergens may differ for seasonal allergic rhinitis and perennial allergic rhinitis. Seasonal allergic rhinitis is caused by pollen. Pollen can come from grasses, trees, and weeds. Perennial allergic rhinitis may be caused by: Dust mites. Proteins in a pet's pee (urine), saliva, or dander. Dander is dead skin cells from a pet. Smoke, mold, or car fumes. Remains of or waste from insects such as cockroaches. What increases the risk? You are more likely to develop this condition if you have a family history of allergies or other conditions related to allergies, including: Allergic conjunctivitis. This is irritation and swelling of parts of the eyes and eyelids. Asthma. This condition affects the lungs and makes it hard to breathe. Atopic dermatitis or eczema. This is long term (chronic) irritation and swelling of the skin. Food allergies. What are the signs or symptoms? Symptoms of this condition include: Sneezing or coughing. A stuffy nose (nasal congestion), itchy nose, or nasal discharge. Itchy eyes and tearing of the eyes. A feeling of mucus dripping down the back of your throat (postnasal drip). This may cause a sore throat. Trouble sleeping. Tiredness. Headache. How is this diagnosed? This condition may be  diagnosed with your symptoms, your medical history, and a physical exam. Your health care provider may check for related conditions, such as: Asthma. Pink eye. This is eye swelling and irritation caused by infection (conjunctivitis). Ear infection. Upper respiratory infection. This is an infection in the nose, throat, or upper airways. You may also have tests to find out which allergens cause your symptoms. These may include skin tests or blood tests. How is this treated? There is no cure for this condition, but treatment can help control symptoms. Treatment may include: Taking medicines that block allergy symptoms, such as corticosteroids (anti-inflammatories) and antihistamines. Medicine may be given as a shot, nasal spray, or pill. Avoiding any allergens. Being exposed again and again to tiny amounts of allergens to help you build a defense against allergens (allergenimmunotherapy). This is done if other treatments have not helped. It may include: Allergy shots. These are injected medicines that have small amounts of an allergen in them. Sublingual immunotherapy. This involves taking small doses of a medicine with an allergen in it under your tongue. If these treatments do not work, your provider may prescribe newer, stronger medicines. Follow these instructions at home: Avoiding allergens Find out what you are allergic to and avoid those allergens. These are some things you can do to help avoid allergens: If you have perennial allergies: Replace carpet with wood, tile, or vinyl flooring. Carpet can trap dander and dust. Do not smoke. Do not allow smoking in your home Change your heating and air conditioning filters at least once a month. If you have seasonal allergies, take these steps during allergy season: Keep windows closed as much  as possible. Plan outdoor activities when pollen counts are lowest. Check pollen counts before you plan outdoor activities When coming indoors, change  clothing and shower before sitting on furniture or bedding. If you have a pet in the house that produces allergens: Keep the pet out of the bedroom. Vacuum, sweep, and dust regularly. General instructions Take over-the-counter and prescription medicines only as told by your provider. Drink enough fluid to keep your pee pale yellow. Where to find more information American Academy of Allergy, Asthma & Immunology: aaaai.org Contact a health care provider if: You have a fever. You develop a cough that does not go away. You make high-pitched whistling sounds when you breathe, most often when you breathe out (wheeze). Your symptoms slow you down or stop you from doing your normal activities each day. Get help right away if: You have shortness of breath. This symptom may be an emergency. Get help right away. Call 911. Do not wait to see if the symptoms will go away. Do not drive yourself to the hospital. This information is not intended to replace advice given to you by your health care provider. Make sure you discuss any questions you have with your health care provider. Document Revised: 12/21/2021 Document Reviewed: 12/21/2021 Elsevier Patient Education  2023 [employer].

## 2022-08-10 NOTE — Progress Notes (Signed)
Subjective  CC:  Chief Complaint  Patient presents with   Cough    Pt stated that he has been coughing for the past week. Nose running with some drainage to the throat   Same day acute visit; PCP not available. New pt to me. Chart reviewed.   HPI: Frank Joseph. is a 67 y.o. male who presents to the office today to address the problems listed above in the chief complaint. 67 yo with white coat htn and hld and chronic allergies on zyrtec 10 and nasocort presents with rhinorrhea, PND and cough. No malaise, f/c/s, sob, ST or GI sxs. Feels well. Has been golfing outside a lot. Wife is home sick and he wants to be sure he is ok. + eye itching and sneezing as well.    Assessment  1. Seasonal allergic rhinitis due to pollen   2. White coat syndrome with diagnosis of hypertension      Plan  SAR:  reassured. No sign of infection at this time. Add astelin ns to nasacort and zyrtec. Start saline nasal washes, especially after golfing. F/u if develops sxs of infection.    Follow up: prn  12/06/2022  No orders of the defined types were placed in this encounter.  Meds ordered this encounter  Medications   azelastine (ASTELIN) 0.1 % nasal spray    Sig: Place 1 spray into both nostrils 2 (two) times daily.    Dispense:  30 mL    Refill:  5      I reviewed the patients updated PMH, FH, and SocHx.    Patient Active Problem List   Diagnosis Date Noted   History of bacteremia 02/26/2018   UTI (urinary tract infection) 02/25/2018   History of adenomatous polyp of colon 08/15/2017   Hyperglycemia 05/21/2016   Thrombocytopenia 05/21/2016   Allergic rhinitis 01/04/2014   Anxiety state, unspecified 05/25/2010   White coat syndrome with diagnosis of hypertension 12/09/2009   Hyperlipidemia 03/07/2007   Migraine 12/08/2006   Current Meds  Medication Sig   azelastine (ASTELIN) 0.1 % nasal spray Place 1 spray into both nostrils 2 (two) times daily.   bisoprolol-hydrochlorothiazide  (ZIAC) 10-6.25 MG tablet Take 1 tablet by mouth daily.   ezetimibe-simvastatin (VYTORIN) 10-20 MG tablet Take 1 tablet by mouth at bedtime.   lisinopril (ZESTRIL) 10 MG tablet TAKE 1 TABLET(10 MG) BY MOUTH DAILY   multivitamin (THERAGRAN) per tablet Take 1 tablet by mouth daily.   SUMAtriptan (IMITREX) 100 MG tablet Take 1 tablet (100 mg total) by mouth every 2 (two) hours as needed for migraine. May repeat in 2 hours if headache persists or recurs.    Allergies: Patient is allergic to [employer] tartrate]. Family History: Patient family history includes Aneurysm in his mother; Glaucoma in his father; Heart attack in his father; Hypertension in his mother. Social History:  Patient  reports that he has never smoked. He has never used smokeless tobacco. He reports current alcohol use. He reports that he does not use drugs.  Review of Systems: Constitutional: Negative for fever malaise or anorexia Cardiovascular: negative for chest pain Respiratory: negative for SOB or persistent cough Gastrointestinal: negative for abdominal pain  Objective  Vitals: BP (!) 180/100   Pulse 70   Temp 98.6 F (37 C)   Ht  (1.854 m)   Wt 199 lb 9.6 oz (90.5 kg)   SpO2 98%   BMI 26.33 kg/m  General: no acute distress , A&Ox3 HEENT: PEERL, conjunctiva  normal, neck is supple, no LAD, Tms clear, nasal mucosa is swollen and red with clear drainage, no sinus ttp, clear OP Cardiovascular:  RRR without murmur or gallop.  Respiratory:  Good breath sounds bilaterally, CTAB with normal respiratory effort   Commons side effects, risks, benefits, and alternatives for medications and treatment plan prescribed today were discussed, and the patient expressed understanding of the given instructions. Patient is instructed to call or message via MyChart if he/she has any questions or concerns regarding our treatment plan. No barriers to understanding were identified. We discussed Red Flag symptoms and signs in  detail. Patient expressed understanding regarding what to do in case of urgent or emergency type symptoms.  Medication list was reconciled, printed and provided to the patient in AVS. Patient instructions and summary information was reviewed with the patient as documented in the AVS. This note was prepared with assistance of Dragon voice recognition software. Occasional wrong-word or sound-a-like substitutions may have occurred due to the inherent limitations of voice recognition software

## 2022-08-17 DIAGNOSIS — H5213 Myopia, bilateral: Secondary | ICD-10-CM | POA: Diagnosis not present

## 2022-09-13 ENCOUNTER — Encounter: Payer: Self-pay | Admitting: Gastroenterology

## 2022-10-05 ENCOUNTER — Ambulatory Visit (AMBULATORY_SURGERY_CENTER): Payer: Medicare Other

## 2022-10-05 VITALS — Ht 73.0 in | Wt 198.0 lb

## 2022-10-05 DIAGNOSIS — Z86010 Personal history of colonic polyps: Secondary | ICD-10-CM

## 2022-10-05 MED ORDER — NA SULFATE-K SULFATE-MG SULF 17.5-3.13-1.6 GM/177ML PO SOLN: 1.0000 | Freq: Once | ORAL | 0 refills | Status: AC

## 2022-10-05 NOTE — Progress Notes (Signed)

## 2022-10-12 ENCOUNTER — Encounter: Payer: Self-pay | Admitting: Gastroenterology

## 2022-10-14 ENCOUNTER — Other Ambulatory Visit: Payer: Self-pay

## 2022-10-14 ENCOUNTER — Encounter: Payer: Self-pay | Admitting: Family Medicine

## 2022-10-14 DIAGNOSIS — Z1283 Encounter for screening for malignant neoplasm of skin: Secondary | ICD-10-CM

## 2022-11-02 ENCOUNTER — Ambulatory Visit (AMBULATORY_SURGERY_CENTER): Payer: Medicare Other | Admitting: Gastroenterology

## 2022-11-02 ENCOUNTER — Encounter: Payer: Self-pay | Admitting: Gastroenterology

## 2022-11-02 VITALS — BP 139/86 | HR 61 | Temp 98.6°F | Resp 12 | Ht 73.0 in | Wt 198.0 lb

## 2022-11-02 DIAGNOSIS — D122 Benign neoplasm of ascending colon: Secondary | ICD-10-CM

## 2022-11-02 DIAGNOSIS — D124 Benign neoplasm of descending colon: Secondary | ICD-10-CM | POA: Diagnosis not present

## 2022-11-02 DIAGNOSIS — K635 Polyp of colon: Secondary | ICD-10-CM | POA: Diagnosis not present

## 2022-11-02 DIAGNOSIS — Z09 Encounter for follow-up examination after completed treatment for conditions other than malignant neoplasm: Secondary | ICD-10-CM | POA: Diagnosis not present

## 2022-11-02 DIAGNOSIS — Z86010 Personal history of colonic polyps: Secondary | ICD-10-CM | POA: Diagnosis not present

## 2022-11-02 MED ORDER — SODIUM CHLORIDE 0.9 % IV SOLN: 500.0000 mL | Freq: Once | INTRAVENOUS | Status: DC

## 2022-11-02 NOTE — Progress Notes (Signed)
Pt's states no medical or surgical changes since previsit or office visit. 

## 2022-11-02 NOTE — Progress Notes (Signed)
A and O x3. Report to RN. Tolerated MAC anesthesia well. 

## 2022-11-02 NOTE — Progress Notes (Signed)
Called to room to assist during endoscopic procedure.  Patient ID and intended procedure confirmed with present staff. Received instructions for my participation in the procedure from the performing physician.  

## 2022-11-02 NOTE — Patient Instructions (Addendum)
Resume previous diet Continue present medications Await pathology results  Handouts/information given for polyps and hemorrhoids  YOU HAD AN ENDOSCOPIC PROCEDURE TODAY AT THE [facility]:   Refer to the procedure report that was given to you for any specific questions about what was found during the examination.  If the procedure report does not answer your questions, please call your gastroenterologist to clarify.  If you requested that your care partner not be given the details of your procedure findings, then the procedure report has been included in a sealed envelope for you to review at your convenience later.  YOU SHOULD EXPECT: Some feelings of bloating in the abdomen. Passage of more gas than usual.  Walking can help get rid of the air that was put into your GI tract during the procedure and reduce the bloating. If you had a lower endoscopy (such as a colonoscopy or flexible sigmoidoscopy) you may notice spotting of blood in your stool or on the toilet paper. If you underwent a bowel prep for your procedure, you may not have a normal bowel movement for a few days.  Please Note:  You might notice some irritation and congestion in your nose or some drainage.  This is from the oxygen used during your procedure.  There is no need for concern and it should clear up in a day or so.  SYMPTOMS TO REPORT IMMEDIATELY:  Following lower endoscopy (colonoscopy):  Excessive amounts of blood in the stool  Significant tenderness or worsening of abdominal pains  Swelling of the abdomen that is new, acute  Fever of 100F or higher  For urgent or emergent issues, a gastroenterologist can be reached at any hour by calling [phone]. Do not use MyChart messaging for urgent concerns.    DIET:  We do recommend a small meal at first, but then you may proceed to your regular diet.  Drink plenty of fluids but you should avoid alcoholic beverages for 24 hours.  MEDICATIONS: Continue present  medications.  FOLLOW UP: Await pathology results. Repeat colonoscopy after studies are complete for surveillance based on pathology results.  Thank you for allowing [place] to provide for your healthcare needs today.  ACTIVITY:  You should plan to take it easy for the rest of today and you should NOT DRIVE or use heavy machinery until tomorrow (because of the sedation medicines used during the test).    FOLLOW UP: Our staff will call the number listed on your records the next business day following your procedure.  We will call around 7:15- 8:00 am to check on you and address any questions or concerns that you may have regarding the information given to you following your procedure. If we do not reach you, we will leave a message.     If any biopsies were taken you will be contacted by phone or by letter within the next 1-3 weeks.  Please call us at [phone] if you have not heard about the biopsies in 3 weeks.    SIGNATURES/CONFIDENTIALITY: You and/or your care partner have signed paperwork which will be entered into your electronic medical record.  These signatures attest to the fact that that the information above on your After Visit Summary has been reviewed and is understood.  Full responsibility of the confidentiality of this discharge information lies with you and/or your care-partner.

## 2022-11-02 NOTE — Op Note (Signed)
[facility] Patient Name: Frank Joseph Procedure Date: 11/02/2022 11:58 AM MRN: [id number] Endoscopist: Meryl Dare , MD, [phone] Age: 67 Referring MD:  Date of Birth: 08-04-1955 Gender: Male Account #: [id number] Procedure:                Colonoscopy Indications:              Surveillance: Personal history of adenomatous                            polyps on last colonoscopy 5 years ago Medicines:                Monitored Anesthesia Care Procedure:                Pre-Anesthesia Assessment:                           - Prior to the procedure, a History and Physical                            was performed, and patient medications and                            allergies were reviewed. The patient's tolerance of                            previous anesthesia was also reviewed. The risks                            and benefits of the procedure and the sedation                            options and risks were discussed with the patient.                            All questions were answered, and informed consent                            was obtained. Prior Anticoagulants: The patient has                            taken no anticoagulant or antiplatelet agents. ASA                            Grade Assessment: II - A patient with mild systemic                            disease. After reviewing the risks and benefits,                            the patient was deemed in satisfactory condition to                            undergo the procedure.  After obtaining informed consent, the colonoscope                            was passed under direct vision. Throughout the                            procedure, the patient's blood pressure, pulse, and                            oxygen saturations were monitored continuously. The                            Olympus Scope SN: [id number] was introduced through                            the anus and advanced  to the the cecum, identified                            by appendiceal orifice and ileocecal valve. The                            ileocecal valve, appendiceal orifice, and rectum                            were photographed. The quality of the bowel                            preparation was good. The colonoscopy was performed                            without difficulty. The patient tolerated the                            procedure well. Scope In: 12:06:43 PM Scope Out: 12:21:44 PM Scope Withdrawal Time: 0 hours 13 minutes 32 seconds  Total Procedure Duration: 0 hours 15 minutes 1 second  Findings:                 The perianal and digital rectal examinations were                            normal.                           Two sessile polyps were found in the descending                            colon and ascending colon. The polyps were 7 to 10                            mm in size. These polyps were removed with a cold                            snare. Resection and retrieval were complete.  Internal hemorrhoids were found during                            retroflexion. The hemorrhoids were small and Grade                            I (internal hemorrhoids that do not prolapse).                           The exam was otherwise without abnormality on                            direct and retroflexion views. Complications:            No immediate complications. Estimated blood loss:                            None. Estimated Blood Loss:     Estimated blood loss: none. Impression:               - Two 7 to 10 mm polyps in the descending colon and                            in the ascending colon, removed with a cold snare.                            Resected and retrieved.                           - Internal hemorrhoids.                           - The examination was otherwise normal on direct                            and retroflexion  views. Recommendation:           - Repeat colonoscopy after studies are complete for                            surveillance based on pathology results.                           - Patient has a contact number available for                            emergencies. The signs and symptoms of potential                            delayed complications were discussed with the                            patient. Return to normal activities tomorrow.                            Written discharge instructions were provided to the  patient.                           - Resume previous diet.                           - Continue present medications.                           - Await pathology results. Meryl Dare, MD 11/02/2022 12:24:14 PM This report has been signed electronically.

## 2022-11-02 NOTE — Progress Notes (Signed)
History & Physical  Primary Care Physician:  Shelva Majestic, MD Primary Gastroenterologist: Claudette Head, MD  Impression / Plan:  Personal history of adenomatous colon polyps for surveillance colonoscopy.  CHIEF COMPLAINT:  Personal history of colon polyps   HPI: Frank Joseph. is a 67 y.o. male with a personal history of adenomatous colon polyps for surveillance colonoscopy.    Past Medical History:  Diagnosis Date   Allergy    Headache(784.0)    Hiatal hernia    Hyperlipidemia    Hypertension    Sinusitis     Past Surgical History:  Procedure Laterality Date   COLONOSCOPY     int hems only- with Rolan Wrightsman    colonscopy     WISDOM TOOTH EXTRACTION      Prior to Admission medications   Medication Sig Start Date End Date Taking? Authorizing Provider  azelastine (ASTELIN) 0.1 % nasal spray Place 1 spray into both nostrils 2 (two) times daily. 08/10/22  Yes Willow Ora, MD  bisoprolol-hydrochlorothiazide [facility]) 10-6.25 MG tablet Take 1 tablet by mouth daily. 06/08/22  Yes Shelva Majestic, MD  ezetimibe-simvastatin (VYTORIN) 10-20 MG tablet Take 1 tablet by mouth at bedtime. 06/08/22  Yes Shelva Majestic, MD  lisinopril (ZESTRIL) 10 MG tablet TAKE 1 TABLET(10 MG) BY MOUTH DAILY 06/08/22  Yes Shelva Majestic, MD  multivitamin [facility]) per tablet Take 1 tablet by mouth daily.   Yes [provider]  triamcinolone (NASACORT ALLERGY 24HR) 55 MCG/ACT AERO nasal inhaler Place 2 sprays into the nose daily.   Yes [provider]  SUMAtriptan (IMITREX) 100 MG tablet Take 1 tablet (100 mg total) by mouth every 2 (two) hours as needed for migraine. May repeat in 2 hours if headache persists or recurs. 06/08/22   Shelva Majestic, MD    Current Outpatient Medications  Medication Sig Dispense Refill   azelastine (ASTELIN) 0.1 % nasal spray Place 1 spray into both nostrils 2 (two) times daily. 30 mL 5   bisoprolol-hydrochlorothiazide (ZIAC) 10-6.25 MG  tablet Take 1 tablet by mouth daily. 90 tablet 3   ezetimibe-simvastatin (VYTORIN) 10-20 MG tablet Take 1 tablet by mouth at bedtime. 90 tablet 3   lisinopril (ZESTRIL) 10 MG tablet TAKE 1 TABLET(10 MG) BY MOUTH DAILY 90 tablet 3   multivitamin (THERAGRAN) per tablet Take 1 tablet by mouth daily.     triamcinolone (NASACORT ALLERGY 24HR) 55 MCG/ACT AERO nasal inhaler Place 2 sprays into the nose daily.     SUMAtriptan (IMITREX) 100 MG tablet Take 1 tablet (100 mg total) by mouth every 2 (two) hours as needed for migraine. May repeat in 2 hours if headache persists or recurs. 10 tablet 5   Current Facility-Administered Medications  Medication Dose Route Frequency Provider Last Rate Last Admin   0.9 %  sodium chloride infusion  500 mL Intravenous Once Meryl Dare, MD        Allergies as of 11/02/2022 - Review Complete 11/02/2022  Allergen Reaction Noted   Ambien [zolpidem tartrate]  03/06/2018    Family History  Problem Relation Age of Onset   Hypertension Mother    Aneurysm Mother    Glaucoma Father    Heart attack Father        age 70   Colon cancer Neg Hx    Colon polyps Neg Hx    Esophageal cancer Neg Hx    Rectal cancer Neg Hx    Stomach cancer Neg Hx  Social History   Socioeconomic History   Marital status: Married    Spouse name: Not on file   Number of children: Not on file   Years of education: Not on file   Highest education level: Not on file  Occupational History   Not on file  Tobacco Use   Smoking status: Never   Smokeless tobacco: Never  Vaping Use   Vaping Use: Never used  Substance and Sexual Activity   Alcohol use: Yes    Comment: 2-24   Drug use: No   Sexual activity: Not on file  Other Topics Concern   Not on file  Social History Narrative   Married 40 years in 2020 (married 1980). 2 kids-pharm sales rep son, Radio producer at [employer] center on [employer]. 3 grandkids.    Played baseball at [employer]. Wife went to [employer].       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[employer], Chemical engineer      Hobbies: golf (Engineer, water), drums   Social Determinants of Health   Financial Resource Strain: Low Risk  (07/22/2022)   Overall Financial Resource Strain (CARDIA)    Difficulty of Paying Living Expenses: Not hard at all  Food Insecurity: No Food Insecurity (07/22/2022)   Hunger Vital Sign    Worried About Running Out of Food in the Last Year: Never true    Ran Out of Food in the Last Year: Never true  Transportation Needs: No Transportation Needs (07/22/2022)   PRAPARE - Administrator, Civil Service (Medical): No    Lack of Transportation (Non-Medical): No  Physical Activity: Sufficiently Active (07/22/2022)   Exercise Vital Sign    Days of Exercise per Week: 7 days    Minutes of Exercise per Session: 120 min  Stress: No Stress Concern Present (07/22/2022)   [employer] of Occupational Health - Occupational Stress Questionnaire    Feeling of Stress : Not at all  Social Connections: Unknown (07/22/2022)   Social Connection and Isolation Panel [NHANES]    Frequency of Communication with Friends and Family: More than three times a week    Frequency of Social Gatherings with Friends and Family: More than three times a week    Attends Religious Services: Not on Insurance claims handler of Clubs or Organizations: Yes    Attends Banker Meetings: 1 to 4 times per year    Marital Status: Married  Catering manager Violence: Not At Risk (07/22/2022)   Humiliation, Afraid, Rape, and Kick questionnaire    Fear of Current or Ex-Partner: No    Emotionally Abused: No    Physically Abused: No    Sexually Abused: No    Review of Systems:  All systems reviewed were negative except where noted in HPI.   Physical Exam:  General:  Alert, well-developed, in NAD Head:  Normocephalic and atraumatic. Eyes:  Sclera clear, no icterus.    Conjunctiva pink. Ears:  Normal auditory acuity. Mouth:  No deformity or lesions.  Neck:  Supple; no masses. Lungs:  Clear throughout to auscultation.   No wheezes, crackles, or rhonchi.  Heart:  Regular rate and rhythm; no murmurs. Abdomen:  Soft, nondistended, nontender. No masses, hepatomegaly. No palpable masses.  Normal bowel sounds.    Rectal:  Deferred   Msk:  Symmetrical without gross deformities. Extremities:  Without edema. Neurologic:  Alert and  oriented x 4; grossly normal neurologically. Skin:  Intact without  significant lesions or rashes. Psych:  Alert and cooperative. Normal mood and affect.   Venita Lick. Russella Dar  11/02/2022, 11:56 AM See Loretha Stapler, [facility], to contact our on call provider

## 2022-11-03 ENCOUNTER — Telehealth: Payer: Self-pay

## 2022-11-03 NOTE — Telephone Encounter (Signed)
  Follow up Call-     11/02/2022   11:04 AM  Call back number  Post procedure Call Back phone  # [phone]  Permission to leave phone message Yes     Patient questions:  Do you have a fever, pain , or abdominal swelling? No. Pain Score  0 *  Have you tolerated food without any problems? Yes.    Have you been able to return to your normal activities? Yes.    Do you have any questions about your discharge instructions: Diet   No. Medications  No. Follow up visit  No.  Do you have questions or concerns about your Care? No.  Actions: * If pain score is 4 or above: No action needed, pain <4.

## 2022-11-15 ENCOUNTER — Encounter: Payer: Self-pay | Admitting: Gastroenterology

## 2022-11-23 DIAGNOSIS — K080 Exfoliation of teeth due to systemic causes: Secondary | ICD-10-CM | POA: Diagnosis not present

## 2022-11-29 DIAGNOSIS — K080 Exfoliation of teeth due to systemic causes: Secondary | ICD-10-CM | POA: Diagnosis not present

## 2022-12-06 ENCOUNTER — Ambulatory Visit: Payer: Medicare Other | Admitting: Family Medicine

## 2022-12-06 ENCOUNTER — Encounter: Payer: Self-pay | Admitting: Family Medicine

## 2022-12-06 VITALS — BP 124/80 | HR 66 | Temp 98.0°F | Ht 73.0 in | Wt 197.2 lb

## 2022-12-06 DIAGNOSIS — E782 Mixed hyperlipidemia: Secondary | ICD-10-CM

## 2022-12-06 DIAGNOSIS — G8929 Other chronic pain: Secondary | ICD-10-CM

## 2022-12-06 DIAGNOSIS — M25571 Pain in right ankle and joints of right foot: Secondary | ICD-10-CM | POA: Diagnosis not present

## 2022-12-06 DIAGNOSIS — Z1283 Encounter for screening for malignant neoplasm of skin: Secondary | ICD-10-CM

## 2022-12-06 DIAGNOSIS — I10 Essential (primary) hypertension: Secondary | ICD-10-CM | POA: Diagnosis not present

## 2022-12-06 DIAGNOSIS — M25552 Pain in left hip: Secondary | ICD-10-CM | POA: Diagnosis not present

## 2022-12-06 NOTE — Progress Notes (Signed)
Phone [phone] In person visit   Subjective:   Frank Huh. is a 67 y.o. year old very pleasant male patient who presents for/with See problem oriented charting Chief Complaint  Patient presents with   Medical Management of Chronic Issues   Hypertension   Past Medical History-  Patient Active Problem List   Diagnosis Date Noted   History of bacteremia 02/26/2018    Priority: High   UTI (urinary tract infection) 02/25/2018    Priority: High   History of adenomatous polyp of colon 08/15/2017    Priority: Medium    White coat syndrome with diagnosis of hypertension 12/09/2009    Priority: Medium    Hyperlipidemia 03/07/2007    Priority: Medium    Migraine 12/08/2006    Priority: Medium    Hyperglycemia 05/21/2016    Priority: Low   Thrombocytopenia (HCC) 05/21/2016    Priority: Low   Allergic rhinitis 01/04/2014    Priority: Low   Anxiety state, unspecified 05/25/2010    Priority: Low    Medications- reviewed and updated Current Outpatient Medications  Medication Sig Dispense Refill   azelastine (ASTELIN) 0.1 % nasal spray Place 1 spray into both nostrils 2 (two) times daily. 30 mL 5   bisoprolol-hydrochlorothiazide (ZIAC) 10-6.25 MG tablet Take 1 tablet by mouth daily. 90 tablet 3   ezetimibe-simvastatin (VYTORIN) 10-20 MG tablet Take 1 tablet by mouth at bedtime. 90 tablet 3   lisinopril (ZESTRIL) 10 MG tablet TAKE 1 TABLET(10 MG) BY MOUTH DAILY 90 tablet 3   multivitamin (THERAGRAN) per tablet Take 1 tablet by mouth daily.     SUMAtriptan (IMITREX) 100 MG tablet Take 1 tablet (100 mg total) by mouth every 2 (two) hours as needed for migraine. May repeat in 2 hours if headache persists or recurs. (Patient not taking: Reported on 12/06/2022) 10 tablet 5   triamcinolone (NASACORT ALLERGY 24HR) 55 MCG/ACT AERO nasal inhaler Place 2 sprays into the nose daily.     No current facility-administered medications for this visit.     Objective:  BP 124/80  Comment: no improvement on repeat  Pulse 66   Temp 98 F (36.7 C)   Ht 6\' 1"  (1.854 m)   Wt 197 lb 3.2 oz (89.4 kg)   SpO2 98%   BMI 26.02 kg/m  Gen: NAD, resting comfortably CV: RRR no murmurs rubs or gallops Lungs: CTAB no crackles, wheeze, rhonchi Abdomen: soft/nontender/nondistended/normal bowel sounds. No rebound or guarding.  Ext: no edema other than some over right lateral malleolus  Skin: warm, dry     Assessment and Plan    #Hypertension S: Compliant with lisinopril 10 mg, bisoprolol hydrochlorothiazide 10-6.25 mg.   Home blood pressure monitoring - 130/78 day after colonoscopy BP Readings from Last 3 Encounters:  12/06/22 124/80  11/02/22 139/86  08/10/22 (!) 180/100  A/P: reasonable control both here and at home per jnc 8 - continue current medications    #Hyperlipidemia S: Compliant with Vytorin 10-20 mg Lab Results  Component Value Date   CHOL 151 06/08/2022   HDL 43.60 06/08/2022   LDLCALC 53 04/12/2012   LDLDIRECT 61.0 06/08/2022   TRIG 308.0 (H) 06/08/2022   CHOLHDL 3 06/08/2022  A/P: LDL at goal- triglyceride(s) slightly high- could improve by cutting down alcohol. Weight down slightly.    # Hyperglycemia/insulin resistance/prediabetes S:  Medication: none Lab Results  Component Value Date   HGBA1C 5.5 06/08/2022   HGBA1C 5.7 01/07/2022   HGBA1C 5.4 05/31/2019   A/P: improved  last visit - recheck next visit   #Migraines S: Typical pattern is approximately 3 times a year.  Sumatriptan 100 mg is helpful. A/P: doing well lately- continue to monitor    #Allergic rhinitis S: zyrtec helpful recently - side effects not bothering him in 2021- has in past.  also on nasocort -astelin with flares in past A/P: improved from the spring- continue to monitor and continue current medications    #Thrombocytopenia- has had thrombocytopenia for years.  We will trend this yearly- stable last visit  #Skin cancer screening- rejected by [facility] dermatology- will try  with [facility]  #Musculoskeletal - right ankle and left hip tend to bother him- hed like to get evaluation with sports medicine  -right ankle worse as had 40k steps in 2 days working on clearing trees  Recommended follow up: Return in about 6 months (around 06/08/2023) for physical or sooner if needed.Schedule b4 you leave. Future Appointments  Date Time Provider Department Center  06/13/2023  8:00 AM Shelva Majestic, MD [facility]  07/28/2023  3:00 PM [facility] ANNUAL WELLNESS VISIT 1 [facility]   Lab/Order associations:   ICD-10-CM   1. White coat syndrome with diagnosis of hypertension  I10     2. Mixed hyperlipidemia  E78.2     3. Screening exam for skin cancer  Z12.83 Ambulatory referral to Dermatology    4. Chronic pain of right ankle  M25.571 Ambulatory referral to Sports Medicine   G89.29     5. Left hip pain  M25.552 Ambulatory referral to Sports Medicine     No orders of the defined types were placed in this encounter.  Return precautions advised.  Tana Conch, MD

## 2022-12-06 NOTE — Patient Instructions (Addendum)
Let us know if you get your flu or COVID vaccine this fall.  We have placed a referral for you today to dermatology within [facility] . You will see # listed below- you can call this if you have not heard within a week.   Recommended follow up: Return in about 6 months (around 06/08/2023) for physical or sooner if needed.Schedule b4 you leave.

## 2022-12-06 NOTE — Addendum Note (Signed)
Addended by: Shelva Majestic on: 12/06/2022 05:40 PM   Modules accepted: Level of Service

## 2022-12-08 DIAGNOSIS — H524 Presbyopia: Secondary | ICD-10-CM | POA: Diagnosis not present

## 2022-12-09 NOTE — Progress Notes (Signed)
Frank Joseph D.[facility] [address] [place] [address] Phone: [phone]   Assessment and Plan:     1. Chronic pain of right ankle 2. Primary osteoarthritis of right ankle -Chronic with exacerbation, initial sports medicine visit - Most consistent with flare of right ankle osteoarthritis with history of multiple lateral ankle sprains - Start HEP for ankle - Start meloxicam 15 mg daily x2 weeks.  If still having pain after 2 weeks, complete 3rd-week of meloxicam. May use remaining meloxicam as needed once daily for pain control.  Do not to use additional NSAIDs while taking meloxicam.  May use Tylenol [phone] mg 2 to 3 times a day for breakthrough pain. - Continue physical activity as tolerated - X-ray obtained in clinic.  My interpretation: No acute fracture or dislocation.  Decreased intra-articular joint space laterally with lateral cortical spurring over lateral malleolus  3. Left hip pain 4. Greater trochanteric bursitis of left hip -Chronic with exacerbation, initial sports medicine visit - Most consistent with greater trochanteric bursitis of left hip with gluteal tendon insertion strain - Start meloxicam 15 mg daily x2 weeks.  If still having pain after 2 weeks, complete 3rd-week of meloxicam. May use remaining meloxicam as needed once daily for pain control.  Do not to use additional NSAIDs while taking meloxicam.  May use Tylenol [phone] mg 2 to 3 times a day for breakthrough pain. - Start HEP for gluteal muscles and hip - X-ray obtained in clinic.  My interpretation: No acute fracture or dislocation.  Mild degenerative changes in intra-articular left hip with acetabular sclerosis and spurring.  More advanced in left versus right  Other orders - meloxicam (MOBIC) 15 MG tablet; Take 1 tablet (15 mg total) by mouth daily.    Pertinent previous records reviewed include none   Follow Up: 4 weeks for reevaluation.  If no improvement  or worsening of symptoms, could consider intra-articular ankle CSI versus greater trochanteric CSI   Subjective:   I, Frank Joseph, am serving as a scribe for Dr. Richardean Sale  Chief Complaint: right ankle and left hip pain   HPI:  12/13/2022 Patient is a 67 year old male complaining of right ankle and left hip pain . Patient states that he has been clearing trees and has been on his feet a lot and that has caused the right ankle to get worse. Patient states he has sprained his right ankle a lot through his life playing sports. Locates pain to lateral ankle pain. Using ice,  heat, voltaren gel.   Tweaked hip playing golf of lifting furniture. Locates the pain lateral left hip pain worse when sitting to long after stretching is better. No pain and aching just bothering him. Does do exercises to help the hips.   Relevant Historical Information: None pertinent  Additional pertinent review of systems negative.   Current Outpatient Medications:    meloxicam (MOBIC) 15 MG tablet, Take 1 tablet (15 mg total) by mouth daily., Disp: 30 tablet, Rfl: 0   azelastine (ASTELIN) 0.1 % nasal spray, Place 1 spray into both nostrils 2 (two) times daily., Disp: 30 mL, Rfl: 5   bisoprolol-hydrochlorothiazide (ZIAC) 10-6.25 MG tablet, Take 1 tablet by mouth daily., Disp: 90 tablet, Rfl: 3   ezetimibe-simvastatin (VYTORIN) 10-20 MG tablet, Take 1 tablet by mouth at bedtime., Disp: 90 tablet, Rfl: 3   lisinopril (ZESTRIL) 10 MG tablet, TAKE 1 TABLET(10 MG) BY MOUTH DAILY, Disp: 90 tablet, Rfl: 3  multivitamin (THERAGRAN) per tablet, Take 1 tablet by mouth daily., Disp: , Rfl:    triamcinolone (NASACORT ALLERGY 24HR) 55 MCG/ACT AERO nasal inhaler, Place 2 sprays into the nose daily., Disp: , Rfl:    Objective:     Vitals:   12/13/22 0801  BP: 130/80  Pulse: 72  SpO2: 97%  Height: 6\' 1"  (1.854 m)      Body mass index is 26.02 kg/m.    Physical Exam:    General: awake, alert, and oriented no  acute distress, nontoxic Skin: no suspicious lesions or rashes Neuro:sensation intact distally with no deficits, normal muscle tone, no atrophy, strength 5/5 in all tested lower ext groups Psych: normal mood and affect, speech clear   Left hip: No deformity, swelling or wasting ROM Flexion 90, ext 30, IR 45, ER 45 TTP moderately greater trochanter, mildly gluteal musculature NTTP over the hip flexors,   si joint, lumbar spine Negative log roll with FROM Negative FABER Negative FADIR  Gait normal     Right ankle:  No deformity, Mild swelling TTP ATFL, CFL NTTP over fibular head, lat mal, medial mal, achilles, navicular, base of 5th,  deltoid, calcaneous or midfoot ROM DF 30, PF 45, inv/ev intact Negative ant drawer, talar tilt, rotation test, squeeze test. Neg thompson No pain with resisted inversion or eversion  Electronically signed by:  Frank Joseph D.[facility] 9:11 AM 12/13/22

## 2022-12-13 ENCOUNTER — Ambulatory Visit: Payer: Medicare Other | Admitting: Sports Medicine

## 2022-12-13 ENCOUNTER — Ambulatory Visit (INDEPENDENT_AMBULATORY_CARE_PROVIDER_SITE_OTHER): Payer: Medicare Other

## 2022-12-13 VITALS — BP 130/80 | HR 72 | Ht 73.0 in

## 2022-12-13 DIAGNOSIS — M25571 Pain in right ankle and joints of right foot: Secondary | ICD-10-CM

## 2022-12-13 DIAGNOSIS — M7062 Trochanteric bursitis, left hip: Secondary | ICD-10-CM | POA: Diagnosis not present

## 2022-12-13 DIAGNOSIS — M25552 Pain in left hip: Secondary | ICD-10-CM

## 2022-12-13 DIAGNOSIS — M19071 Primary osteoarthritis, right ankle and foot: Secondary | ICD-10-CM

## 2022-12-13 DIAGNOSIS — M7731 Calcaneal spur, right foot: Secondary | ICD-10-CM | POA: Diagnosis not present

## 2022-12-13 DIAGNOSIS — G8929 Other chronic pain: Secondary | ICD-10-CM

## 2022-12-13 MED ORDER — MELOXICAM 15 MG PO TABS: 15.0000 mg | ORAL_TABLET | Freq: Every day | ORAL | 0 refills | Status: DC

## 2022-12-13 NOTE — Patient Instructions (Addendum)
God to see you  - Start meloxicam 15 mg daily x2 weeks.  If still having pain after 2 weeks, complete 3rd-week of meloxicam. May use remaining meloxicam as needed once daily for pain control.  Do not to use additional NSAIDs while taking meloxicam.  May use Tylenol [phone] mg 2 to 3 times a day for breakthrough pain. Glute and ankle exercises given May continue Voltaren gel and bio freeze Follow up in weeks

## 2022-12-20 ENCOUNTER — Encounter: Payer: Self-pay | Admitting: Sports Medicine

## 2023-01-07 NOTE — Progress Notes (Unsigned)
    Aleen Sells [facility] [address] [place] [address] Phone: [phone]   Assessment and Plan:     There are no diagnoses linked to this encounter.  ***   Pertinent previous records reviewed include ***   Follow Up: ***     Subjective:   I, Chia Mowers, am serving as a Neurosurgeon for Doctor Richardean Sale  Chief Complaint: right ankle and left hip pain    HPI:  12/13/2022 Patient is a 67 year old male complaining of right ankle and left hip pain . Patient states that he has been clearing trees and has been on his feet a lot and that has caused the right ankle to get worse. Patient states he has sprained his right ankle a lot through his life playing sports. Locates pain to lateral ankle pain. Using ice,  heat, voltaren gel.    Tweaked hip playing golf of lifting furniture. Locates the pain lateral left hip pain worse when sitting to long after stretching is better. No pain and aching just bothering him. Does do exercises to help the hips.   01/10/2023 Patient states    Relevant Historical Information: None pertinent  Additional pertinent review of systems negative.   Current Outpatient Medications:    azelastine (ASTELIN) 0.1 % nasal spray, Place 1 spray into both nostrils 2 (two) times daily., Disp: 30 mL, Rfl: 5   bisoprolol-hydrochlorothiazide (ZIAC) 10-6.25 MG tablet, Take 1 tablet by mouth daily., Disp: 90 tablet, Rfl: 3   ezetimibe-simvastatin (VYTORIN) 10-20 MG tablet, Take 1 tablet by mouth at bedtime., Disp: 90 tablet, Rfl: 3   lisinopril (ZESTRIL) 10 MG tablet, TAKE 1 TABLET(10 MG) BY MOUTH DAILY, Disp: 90 tablet, Rfl: 3   meloxicam (MOBIC) 15 MG tablet, Take 1 tablet (15 mg total) by mouth daily., Disp: 30 tablet, Rfl: 0   multivitamin (THERAGRAN) per tablet, Take 1 tablet by mouth daily., Disp: , Rfl:    triamcinolone (NASACORT ALLERGY 24HR) 55 MCG/ACT AERO nasal inhaler, Place 2 sprays into the nose daily.,  Disp: , Rfl:    Objective:     There were no vitals filed for this visit.    There is no height or weight on file to calculate BMI.    Physical Exam:    ***   Electronically signed by:  Aleen Sells [facility] 12:03 PM 01/07/23

## 2023-01-10 ENCOUNTER — Ambulatory Visit: Payer: Medicare Other | Admitting: Sports Medicine

## 2023-01-10 VITALS — BP 132/78 | HR 89 | Ht 73.0 in | Wt 198.0 lb

## 2023-01-10 DIAGNOSIS — M19071 Primary osteoarthritis, right ankle and foot: Secondary | ICD-10-CM

## 2023-01-10 DIAGNOSIS — G8929 Other chronic pain: Secondary | ICD-10-CM

## 2023-01-10 DIAGNOSIS — M25552 Pain in left hip: Secondary | ICD-10-CM | POA: Diagnosis not present

## 2023-01-10 DIAGNOSIS — M25571 Pain in right ankle and joints of right foot: Secondary | ICD-10-CM | POA: Diagnosis not present

## 2023-01-10 DIAGNOSIS — M7062 Trochanteric bursitis, left hip: Secondary | ICD-10-CM | POA: Diagnosis not present

## 2023-01-10 MED ORDER — MELOXICAM 15 MG PO TABS: 15.0000 mg | ORAL_TABLET | Freq: Every day | ORAL | 0 refills | Status: AC | PRN

## 2023-01-10 NOTE — Patient Instructions (Signed)
Discontinue daily meloxicam and use remainder as needed  Meloxicam refill Tylenol for day to day pain relief  Continue HEP  3 week follow up

## 2023-01-26 NOTE — Progress Notes (Unsigned)
    Frank Joseph [facility] [address] [place] [address] Phone: [phone]   Assessment and Plan:     There are no diagnoses linked to this encounter.  ***   Pertinent previous records reviewed include ***   Follow Up: ***     Subjective:   I, Frank Joseph, am serving as a Neurosurgeon for Doctor Richardean Sale   Chief Complaint: right ankle and left hip pain    HPI:  12/13/2022 Patient is a 67 year old male complaining of right ankle and left hip pain . Patient states that he has been clearing trees and has been on his feet a lot and that has caused the right ankle to get worse. Patient states he has sprained his right ankle a lot through his life playing sports. Locates pain to lateral ankle pain. Using ice,  heat, voltaren gel.    Tweaked hip playing golf of lifting furniture. Locates the pain lateral left hip pain worse when sitting to long after stretching is better. No pain and aching just bothering him. Does do exercises to help the hips.    01/10/2023 Patient states that he is better   01/27/2023 Patient states    Relevant Historical Information: None pertinent  Additional pertinent review of systems negative.   Current Outpatient Medications:    azelastine (ASTELIN) 0.1 % nasal spray, Place 1 spray into both nostrils 2 (two) times daily., Disp: 30 mL, Rfl: 5   bisoprolol-hydrochlorothiazide (ZIAC) 10-6.25 MG tablet, Take 1 tablet by mouth daily., Disp: 90 tablet, Rfl: 3   ezetimibe-simvastatin (VYTORIN) 10-20 MG tablet, Take 1 tablet by mouth at bedtime., Disp: 90 tablet, Rfl: 3   lisinopril (ZESTRIL) 10 MG tablet, TAKE 1 TABLET(10 MG) BY MOUTH DAILY, Disp: 90 tablet, Rfl: 3   meloxicam (MOBIC) 15 MG tablet, Take 1 tablet (15 mg total) by mouth daily as needed for pain., Disp: 30 tablet, Rfl: 0   multivitamin (THERAGRAN) per tablet, Take 1 tablet by mouth daily., Disp: , Rfl:    triamcinolone (NASACORT ALLERGY 24HR) 55  MCG/ACT AERO nasal inhaler, Place 2 sprays into the nose daily., Disp: , Rfl:    Objective:     There were no vitals filed for this visit.    There is no height or weight on file to calculate BMI.    Physical Exam:    ***   Electronically signed by:  Frank [facility] 8:02 AM 01/26/23

## 2023-01-27 ENCOUNTER — Ambulatory Visit: Payer: Medicare Other | Admitting: Sports Medicine

## 2023-01-27 VITALS — BP 132/84 | HR 65 | Ht 73.0 in | Wt 194.0 lb

## 2023-01-27 DIAGNOSIS — M25571 Pain in right ankle and joints of right foot: Secondary | ICD-10-CM

## 2023-01-27 DIAGNOSIS — G8929 Other chronic pain: Secondary | ICD-10-CM

## 2023-01-27 DIAGNOSIS — M25552 Pain in left hip: Secondary | ICD-10-CM | POA: Diagnosis not present

## 2023-01-27 DIAGNOSIS — M19071 Primary osteoarthritis, right ankle and foot: Secondary | ICD-10-CM

## 2023-01-27 DIAGNOSIS — M7062 Trochanteric bursitis, left hip: Secondary | ICD-10-CM | POA: Diagnosis not present

## 2023-01-27 NOTE — Patient Instructions (Signed)
Foot HEP  As needed follow up

## 2023-05-03 DIAGNOSIS — L570 Actinic keratosis: Secondary | ICD-10-CM | POA: Diagnosis not present

## 2023-05-03 DIAGNOSIS — D2272 Melanocytic nevi of left lower limb, including hip: Secondary | ICD-10-CM | POA: Diagnosis not present

## 2023-05-03 DIAGNOSIS — D225 Melanocytic nevi of trunk: Secondary | ICD-10-CM | POA: Diagnosis not present

## 2023-05-03 DIAGNOSIS — L812 Freckles: Secondary | ICD-10-CM | POA: Diagnosis not present

## 2023-05-03 DIAGNOSIS — L821 Other seborrheic keratosis: Secondary | ICD-10-CM | POA: Diagnosis not present

## 2023-05-23 NOTE — Progress Notes (Unsigned)
    Frank Joseph [facility] [address] [place] [address] Phone: [phone]   Assessment and Plan:     There are no diagnoses linked to this encounter.  ***   Pertinent previous records reviewed include ***    Follow Up: ***     Subjective:   I, Frank Joseph, am serving as a Neurosurgeon for Doctor Richardean Sale   Chief Complaint: right ankle and left hip pain    HPI:  12/13/2022 Patient is a 68 year old male complaining of right ankle and left hip pain . Patient states that he has been clearing trees and has been on his feet a lot and that has caused the right ankle to get worse. Patient states he has sprained his right ankle a lot through his life playing sports. Locates pain to lateral ankle pain. Using ice,  heat, voltaren gel.    Tweaked hip playing golf of lifting furniture. Locates the pain lateral left hip pain worse when sitting to long after stretching is better. No pain and aching just bothering him. Does do exercises to help the hips.    01/10/2023 Patient states that he is better   01/27/2023 Patient states that he is much better , ankle a little flare. Hip no issues    05/24/2023 Patient states   Relevant Historical Information: None pertinent Additional pertinent review of systems negative.   Current Outpatient Medications:    azelastine (ASTELIN) 0.1 % nasal spray, Place 1 spray into both nostrils 2 (two) times daily., Disp: 30 mL, Rfl: 5   bisoprolol-hydrochlorothiazide (ZIAC) 10-6.25 MG tablet, Take 1 tablet by mouth daily., Disp: 90 tablet, Rfl: 3   ezetimibe-simvastatin (VYTORIN) 10-20 MG tablet, Take 1 tablet by mouth at bedtime., Disp: 90 tablet, Rfl: 3   lisinopril (ZESTRIL) 10 MG tablet, TAKE 1 TABLET(10 MG) BY MOUTH DAILY, Disp: 90 tablet, Rfl: 3   meloxicam (MOBIC) 15 MG tablet, Take 1 tablet (15 mg total) by mouth daily as needed for pain., Disp: 30 tablet, Rfl: 0   multivitamin (THERAGRAN) per tablet,  Take 1 tablet by mouth daily., Disp: , Rfl:    triamcinolone (NASACORT ALLERGY 24HR) 55 MCG/ACT AERO nasal inhaler, Place 2 sprays into the nose daily., Disp: , Rfl:    Objective:     There were no vitals filed for this visit.    There is no height or weight on file to calculate BMI.    Physical Exam:    ***   Electronically signed by:  Frank [facility] 2:10 PM 05/23/23

## 2023-05-24 ENCOUNTER — Ambulatory Visit: Payer: Medicare Other | Admitting: Sports Medicine

## 2023-05-24 VITALS — BP 138/88 | HR 68 | Ht 73.0 in | Wt 200.0 lb

## 2023-05-24 DIAGNOSIS — M7062 Trochanteric bursitis, left hip: Secondary | ICD-10-CM | POA: Diagnosis not present

## 2023-05-24 DIAGNOSIS — M25552 Pain in left hip: Secondary | ICD-10-CM | POA: Diagnosis not present

## 2023-05-24 NOTE — Patient Instructions (Addendum)
Good to see you  Injection in hip today  Increase hydration Move wallet to side pocket Continue home exercises  Call us if you need refill of meloxicam, physical therapy referral or MRI

## 2023-05-26 DIAGNOSIS — K080 Exfoliation of teeth due to systemic causes: Secondary | ICD-10-CM | POA: Diagnosis not present

## 2023-06-13 ENCOUNTER — Encounter: Payer: Self-pay | Admitting: Family Medicine

## 2023-06-13 ENCOUNTER — Ambulatory Visit (INDEPENDENT_AMBULATORY_CARE_PROVIDER_SITE_OTHER): Payer: Medicare Other | Admitting: Family Medicine

## 2023-06-13 VITALS — BP 148/92 | HR 64 | Temp 98.0°F | Ht 73.0 in | Wt 194.0 lb

## 2023-06-13 DIAGNOSIS — Z131 Encounter for screening for diabetes mellitus: Secondary | ICD-10-CM

## 2023-06-13 DIAGNOSIS — R739 Hyperglycemia, unspecified: Secondary | ICD-10-CM

## 2023-06-13 DIAGNOSIS — Z125 Encounter for screening for malignant neoplasm of prostate: Secondary | ICD-10-CM | POA: Diagnosis not present

## 2023-06-13 DIAGNOSIS — Z0000 Encounter for general adult medical examination without abnormal findings: Secondary | ICD-10-CM | POA: Diagnosis not present

## 2023-06-13 DIAGNOSIS — I10 Essential (primary) hypertension: Secondary | ICD-10-CM

## 2023-06-13 DIAGNOSIS — E782 Mixed hyperlipidemia: Secondary | ICD-10-CM

## 2023-06-13 LAB — URINALYSIS, ROUTINE W REFLEX MICROSCOPIC
Bilirubin Urine: NEGATIVE
Hgb urine dipstick: NEGATIVE
Ketones, ur: NEGATIVE
Leukocytes,Ua: NEGATIVE
Nitrite: NEGATIVE
RBC / HPF: NONE SEEN (ref 0–?)
Specific Gravity, Urine: 1.025 (ref 1.000–1.030)
Total Protein, Urine: NEGATIVE
Urine Glucose: NEGATIVE
Urobilinogen, UA: 0.2 (ref 0.0–1.0)
WBC, UA: NONE SEEN (ref 0–?)
pH: 6 (ref 5.0–8.0)

## 2023-06-13 LAB — LIPID PANEL
Cholesterol: 141 mg/dL (ref 0–200)
HDL: 46.4 mg/dL (ref >39.00)
LDL Cholesterol: 47 mg/dL (ref 0–99)
NonHDL: 94.23
Total CHOL/HDL Ratio: 3
Triglycerides: 237 mg/dL — ABNORMAL HIGH (ref 0.0–149.0)
VLDL: 47.4 mg/dL — ABNORMAL HIGH (ref 0.0–40.0)

## 2023-06-13 LAB — COMPREHENSIVE METABOLIC PANEL
ALT: 33 U/L (ref 0–53)
AST: 25 U/L (ref 0–37)
Albumin: 4.4 g/dL (ref 3.5–5.2)
Alkaline Phosphatase: 50 U/L (ref 39–117)
BUN: 13 mg/dL (ref 6–23)
CO2: 28 meq/L (ref 19–32)
Calcium: 9.1 mg/dL (ref 8.4–10.5)
Chloride: 102 meq/L (ref 96–112)
Creatinine, Ser: 0.93 mg/dL (ref 0.40–1.50)
GFR: 84.52 mL/min (ref >60.00)
Glucose, Bld: 103 mg/dL — ABNORMAL HIGH (ref 70–99)
Potassium: 4.1 meq/L (ref 3.5–5.1)
Sodium: 139 meq/L (ref 135–145)
Total Bilirubin: 1.1 mg/dL (ref 0.2–1.2)
Total Protein: 6.5 g/dL (ref 6.0–8.3)

## 2023-06-13 LAB — CBC WITH DIFFERENTIAL/PLATELET
Basophils Absolute: 0 10*3/uL (ref 0.0–0.1)
Basophils Relative: 0.7 % (ref 0.0–3.0)
Eosinophils Absolute: 0.1 10*3/uL (ref 0.0–0.7)
Eosinophils Relative: 1.7 % (ref 0.0–5.0)
HCT: 45.5 % (ref 39.0–52.0)
Hemoglobin: 15.6 g/dL (ref 13.0–17.0)
Lymphocytes Relative: 17.6 % (ref 12.0–46.0)
Lymphs Abs: 0.9 10*3/uL (ref 0.7–4.0)
MCHC: 34.2 g/dL (ref 30.0–36.0)
MCV: 96.6 fL (ref 78.0–100.0)
Monocytes Absolute: 0.5 10*3/uL (ref 0.1–1.0)
Monocytes Relative: 9.5 % (ref 3.0–12.0)
Neutro Abs: 3.5 10*3/uL (ref 1.4–7.7)
Neutrophils Relative %: 70.5 % (ref 43.0–77.0)
Platelets: 119 10*3/uL — ABNORMAL LOW (ref 150.0–400.0)
RBC: 4.71 Mil/uL (ref 4.22–5.81)
RDW: 12.8 % (ref 11.5–15.5)
WBC: 5 10*3/uL (ref 4.0–10.5)

## 2023-06-13 LAB — HEMOGLOBIN A1C: Hgb A1c MFr Bld: 5.6 % (ref 4.6–6.5)

## 2023-06-13 LAB — PSA: PSA: 1.98 ng/mL (ref 0.10–4.00)

## 2023-06-13 MED ORDER — BISOPROLOL-HYDROCHLOROTHIAZIDE 10-6.25 MG PO TABS: 1.0000 | ORAL_TABLET | Freq: Every day | ORAL | 3 refills | Status: AC

## 2023-06-13 MED ORDER — EZETIMIBE-SIMVASTATIN 10-20 MG PO TABS: 1.0000 | ORAL_TABLET | Freq: Every day | ORAL | 3 refills | Status: AC

## 2023-06-13 MED ORDER — LISINOPRIL 10 MG PO TABS: ORAL_TABLET | ORAL | 3 refills | Status: AC

## 2023-06-13 NOTE — Progress Notes (Signed)
Phone: [phone]   Subjective:  Patient presents today for their annual physical. Chief complaint-noted.   See problem oriented charting- ROS- full  review of systems was completed and negative  except for: some hip and ankle pain working through  The following were reviewed and entered/updated in epic: Past Medical History:  Diagnosis Date   Allergy    Headache(784.0)    Hiatal hernia    Hyperlipidemia    Hypertension    Sinusitis    Patient Active Problem List   Diagnosis Date Noted   History of bacteremia 02/26/2018    Priority: High   UTI (urinary tract infection) 02/25/2018    Priority: High   History of adenomatous polyp of colon 08/15/2017    Priority: Medium    White coat syndrome with diagnosis of hypertension 12/09/2009    Priority: Medium    Hyperlipidemia 03/07/2007    Priority: Medium    Migraine 12/08/2006    Priority: Medium    Hyperglycemia 05/21/2016    Priority: Low   Thrombocytopenia (HCC) 05/21/2016    Priority: Low   Allergic rhinitis 01/04/2014    Priority: Low   Anxiety state 05/25/2010    Priority: Low   Past Surgical History:  Procedure Laterality Date   COLONOSCOPY     int hems only- with stark    colonscopy     WISDOM TOOTH EXTRACTION      Family History  Problem Relation Age of Onset   Hypertension Mother    Aneurysm Mother    Glaucoma Father    Heart attack Father        age 84   Colon cancer Neg Hx    Colon polyps Neg Hx    Esophageal cancer Neg Hx    Rectal cancer Neg Hx    Stomach cancer Neg Hx     Medications- reviewed and updated Current Outpatient Medications  Medication Sig Dispense Refill   meloxicam (MOBIC) 15 MG tablet Take 1 tablet (15 mg total) by mouth daily as needed for pain. 30 tablet 0   multivitamin (THERAGRAN) per tablet Take 1 tablet by mouth daily.     triamcinolone (NASACORT ALLERGY 24HR) 55 MCG/ACT AERO nasal inhaler Place 2 sprays into the nose daily.     bisoprolol-hydrochlorothiazide  (ZIAC) 10-6.25 MG tablet Take 1 tablet by mouth daily. 90 tablet 3   ezetimibe-simvastatin (VYTORIN) 10-20 MG tablet Take 1 tablet by mouth at bedtime. 90 tablet 3   lisinopril (ZESTRIL) 10 MG tablet TAKE 1 TABLET(10 MG) BY MOUTH DAILY 90 tablet 3   No current facility-administered medications for this visit.    Allergies-reviewed and updated Allergies  Allergen Reactions   Ambien [Zolpidem Tartrate]     Used during hospitalization- became delirious     Social History   Social History Narrative   Married 40 years in 2020 (married 1980). 2 kids-pharm sales rep son, Radio producer at [employer] center on [employer]. 3 grandkids.    Played baseball at [employer]. Wife went to [employer].       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 Oropharynx normal Neck: no thyromegaly CV: RRR no murmurs rubs or gallops Lungs: CTAB no crackles, wheeze, rhonchi Abdomen: soft/nontender/nondistended/normal bowel sounds. No rebound or guarding.  Ext: no edema Skin: warm, dry Neuro: grossly normal, moves all extremities, PERRLA Declines genitourinary and rectal exam   Assessment and Plan  68 y.o. male presenting for annual physical.  Health Maintenance counseling: 1. Anticipatory guidance: Patient counseled regarding regular dental exams -q6 months, eye exams -yearly,  avoiding smoking and second hand smoke, limiting alcohol to 2 beverages per day - around 21 a week- encouraged max 2 a day- less is even better, no illicit drugs .   2. Risk factor reduction:  Advised patient of need for regular exercise and diet rich and fruits and vegetables to reduce risk of  heart attack and stroke.  Exercise- active with exercise daily still- golf and drumming- also does weight lifting.  Diet/weight management-Down 5 pounds from last physical.  Around 190 in the summer moths so even better Wt Readings from Last 3 Encounters:  06/13/23 194 lb (88 kg)  05/24/23 200 lb (90.7 kg)  01/27/23 194 lb (88 kg)  3. Immunizations/screenings/ancillary studies- declines COVID 19, Prevnar 20 has wanted to wait until 2027 at least . Flu shot today Immunization History  Administered Date(s) Administered   Influenza Split 04/23/2011   Influenza Whole 01/03/2009   Influenza,inj,Quad PF,6+ Mos 01/04/2014, 01/22/2015, 05/25/2016, 05/13/2017, 04/04/2018, 02/20/2019, 01/08/2022   PFIZER(Purple Top)SARS-COV-2 Vaccination 07/01/2019, 08/10/2019, 01/29/2020   Pneumococcal Polysaccharide-23 06/03/2020   Td 04/26/2006   Tdap 05/25/2016   Zoster Recombinant(Shingrix) 05/29/2018, 09/26/2018   4. Prostate cancer screening-  low risk prior trend- update psa today . BPH on prior exam but stabel urinary symptoms Lab Results  Component Value Date   PSA 1.03 06/08/2022   PSA 0.99 06/04/2021   PSA 1.00 06/03/2020  5. Colon cancer screening - 11/02/22 with 3 year follow up planned due to polyps  6. Skin cancer screening-[facility] [facility] did not accept initially then he was able to get in -advised regular sunscreen use.  Denies worrisome, changing, or new skin lesions.   7. Smoking associated screening (lung cancer screening, AAA screen 65-75, UA)- Never smoker   8. STD screening -only active with wife so does not need     Status of chronic or acute concerns   # Trochanteric bursitis-has worked with Dr. Jean Rosenthal of sports medicine-doing better at this point. Also working on his ankle- doing physical therapy stuff for this  -has had 2 steroid shots form last visit- a1c could be slightly higher due to this   #Hypertension- white coat hypertension  S: Compliant with lisinopril 10 mg, bisoprolol  hydrochlorothiazide 10-6.25 mg.  Home blood pressure monitoring- less lately as has looked so good at sports medicine visits   BP Readings from Last 3 Encounters:  06/13/23 (!) 148/92  05/24/23 138/88  01/27/23 132/84  A/P: blood pressure slightly elevated today but missed medications yesterday- he is going to get back on this regularly and update me in a few days by mychart with home readings- likely continue current medications    #Hyperlipidemia S: Compliant with Vytorin 10-20 mg Lab Results  Component Value Date   CHOL 151 06/08/2022   HDL 43.60 06/08/2022   LDLCALC 53 04/12/2012   LDLDIRECT 61.0 06/08/2022   TRIG 308.0 (H) 06/08/2022   CHOLHDL 3 06/08/2022  A/P: at goal for cholesterol last year- triglyceride(s) slightly high and we discussed reduced alcohol may help- continue current medications for now    #  Migraines S: Typical pattern is approximately 3 times a year.  Sumatriptan 100 mg is helpful. A/P: overall stable- continue current medications    #Allergic rhinitis S: zyrtec helpful recently - side effects not bothering him in 2021- has in past.  also on nasocort. Only uses as needed -astelin with flares in past A/P: stable- continue current medicines    #Thrombocytopenia- has had thrombocytopenia for years.  We will trend this today. Lab Results  Component Value Date   WBC 4.6 06/08/2022   HGB 16.6 06/08/2022   HCT 49.1 06/08/2022   MCV 92.3 06/08/2022   PLT 134.0 (L) 06/08/2022   % History of bacteremia-UTI led to bacteremia in late 2019.  Patient hospitalized at the time.  No history of UTIs outside of this and presented with primarily nausea/vomiting.  BPH likely contributed  #GERD - occasional issues and addresses with OTC (available over the counter without a prescription) medicine  #toenail discoloration- vicks vaporub helpful usually  Recommended follow up: Return in about 6 months (around 12/11/2023) for followup or sooner if needed.Schedule b4 you  leave. Future Appointments  Date Time Provider Department Center  07/28/2023  3:00 PM [facility] ANNUAL WELLNESS VISIT 1 [facility]   Lab/Order associations: fasting   ICD-10-CM   1. Preventative health care  Z00.00     2. Mixed hyperlipidemia  E78.2 Comprehensive metabolic panel    CBC with Differential/Platelet    Lipid panel    Urinalysis, Routine w reflex microscopic    3. Hyperglycemia  R73.9 Hemoglobin A1c    4. Screening for diabetes mellitus  Z13.1 Hemoglobin A1c    5. Screening for prostate cancer  Z12.5 PSA    6. White coat syndrome with diagnosis of hypertension  I10 Comprehensive metabolic panel    CBC with Differential/Platelet    Urinalysis, Routine w reflex microscopic      Meds ordered this encounter  Medications   bisoprolol-hydrochlorothiazide (ZIAC) 10-6.25 MG tablet    Sig: Take 1 tablet by mouth daily.    Dispense:  90 tablet    Refill:  3   ezetimibe-simvastatin (VYTORIN) 10-20 MG tablet    Sig: Take 1 tablet by mouth at bedtime.    Dispense:  90 tablet    Refill:  3   lisinopril (ZESTRIL) 10 MG tablet    Sig: TAKE 1 TABLET(10 MG) BY MOUTH DAILY    Dispense:  90 tablet    Refill:  3    Return precautions advised.  Tana Conch, MD

## 2023-06-13 NOTE — Patient Instructions (Addendum)
Flu shot today  Please stop by lab before you go If you have mychart- we will send your results within 3 business days of [place] receiving them.  If you do not have mychart- we will call you about results within 5 business days of [place] receiving them.  *please also note that you will see labs on mychart as soon as they post. I will later go in and write notes on them- will say "notes from Dr. Buel Ream job on weight loss! Keep up the good work. Would love to see alcohol 2 a day maximum and less is even better with recent surgeon general warning  blood pressure slightly elevated today but missed medications yesterday- he is going to get back on this regularly and update me in a few days by mychart with home readings- likely continue current medications    Recommended follow up: Return in about 6 months (around 12/11/2023) for followup or sooner if needed.Schedule b4 you leave.

## 2023-06-14 ENCOUNTER — Other Ambulatory Visit: Payer: Self-pay

## 2023-06-14 DIAGNOSIS — R972 Elevated prostate specific antigen [PSA]: Secondary | ICD-10-CM

## 2023-07-10 ENCOUNTER — Encounter: Payer: Self-pay | Admitting: Family Medicine

## 2023-07-11 ENCOUNTER — Encounter: Payer: Self-pay | Admitting: Family Medicine

## 2023-07-11 ENCOUNTER — Other Ambulatory Visit (INDEPENDENT_AMBULATORY_CARE_PROVIDER_SITE_OTHER): Payer: Medicare Other

## 2023-07-11 DIAGNOSIS — R972 Elevated prostate specific antigen [PSA]: Secondary | ICD-10-CM

## 2023-07-11 LAB — PSA: PSA: 1.37 ng/mL (ref 0.10–4.00)

## 2023-07-28 ENCOUNTER — Ambulatory Visit: Payer: Medicare Other

## 2023-07-28 VITALS — Ht 73.0 in | Wt 194.0 lb

## 2023-07-28 DIAGNOSIS — Z0000 Encounter for general adult medical examination without abnormal findings: Secondary | ICD-10-CM | POA: Diagnosis not present

## 2023-07-28 NOTE — Progress Notes (Signed)
 Subjective:   Frank Huh. is a 68 y.o. who presents for a Medicare Wellness preventive visit.    Visit Complete: Virtual I connected with  Frank Huh. on 07/28/23 by a audio enabled telemedicine application and verified that I am speaking with the correct person using two identifiers.  Patient Location: Home  Provider Location: Office/Clinic  I discussed the limitations of evaluation and management by telemedicine. The patient expressed understanding and agreed to proceed.  Vital Signs: Because this visit was a virtual/telehealth visit, some criteria may be missing or patient reported. Any vitals not documented were not able to be obtained and vitals that have been documented are patient reported.  VideoDeclined- This patient declined Librarian, academic. Therefore the visit was completed with audio only.  Persons Participating in Visit: Patient.  AWV Questionnaire: No: Patient Medicare AWV questionnaire was not completed prior to this visit.  Cardiac Risk Factors include: advanced age (>13men, >30 women);dyslipidemia;male gender     Objective:    Today's Vitals   07/28/23 1504  Weight: 194 lb (88 kg)  Height: 6\' 1"  (1.854 m)   Body mass index is 25.6 kg/m.     07/28/2023    3:07 PM 07/22/2022    3:31 PM 06/09/2021    8:09 AM 02/25/2018    5:20 AM 02/25/2018   12:53 AM 08/02/2017   10:05 AM 07/19/2017    9:10 AM  Advanced Directives  Does Patient Have a Medical Advance Directive? Yes Yes Yes Yes No;Yes Yes Yes  Type of Estate agent of [place];Living will Healthcare Power of [place];Living will Healthcare Power of [employer] Power of Attorney Out of facility DNR (pink MOST or yellow form);Living will;Healthcare Power of [employer] of [place];Living will  Does patient want to make changes to medical advance directive?    No - Patient declined     Copy of Healthcare Power of Attorney  in Chart? No - copy requested No - copy requested No - copy requested No - copy requested       Current Medications (verified) Outpatient Encounter Medications as of 07/28/2023  Medication Sig   bisoprolol-hydrochlorothiazide (ZIAC) 10-6.25 MG tablet Take 1 tablet by mouth daily.   ezetimibe-simvastatin (VYTORIN) 10-20 MG tablet Take 1 tablet by mouth at bedtime.   lisinopril (ZESTRIL) 10 MG tablet TAKE 1 TABLET(10 MG) BY MOUTH DAILY   meloxicam (MOBIC) 15 MG tablet Take 1 tablet (15 mg total) by mouth daily as needed for pain.   multivitamin (THERAGRAN) per tablet Take 1 tablet by mouth daily.   triamcinolone (NASACORT ALLERGY 24HR) 55 MCG/ACT AERO nasal inhaler Place 2 sprays into the nose daily.   No facility-administered encounter medications on file as of 07/28/2023.    Allergies (verified) Ambien [zolpidem tartrate]   History: Past Medical History:  Diagnosis Date   Allergy    Headache(784.0)    Hiatal hernia    Hyperlipidemia    Hypertension    Sinusitis    Past Surgical History:  Procedure Laterality Date   COLONOSCOPY     int hems only- with stark    colonscopy     WISDOM TOOTH EXTRACTION     Family History  Problem Relation Age of Onset   Hypertension Mother    Aneurysm Mother    Glaucoma Father    Heart attack Father        age 50   Colon cancer Neg Hx    Colon polyps  Neg Hx    Esophageal cancer Neg Hx    Rectal cancer Neg Hx    Stomach cancer Neg Hx    Social History   Socioeconomic History   Marital status: Married    Spouse name: Not on file   Number of children: Not on file   Years of education: Not on file   Highest education level: Not on file  Occupational History   Not on file  Tobacco Use   Smoking status: Never   Smokeless tobacco: Never  Vaping Use   Vaping status: Never Used  Substance and Sexual Activity   Alcohol use: Yes    Comment: 2-24   Drug use: No   Sexual activity: Not on file  Other Topics Concern   Not on file   Social History Narrative   Married 40 years in 2020 (married 1980). 2 kids-pharm sales rep son, Radio producer at [employer] center on [employer]. 3 grandkids.    Played baseball at [employer]. Wife went to [employer].       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[employer], Chemical engineer      Hobbies: golf (Engineer, water), drums   Social Drivers of Health   Financial Resource Strain: Low Risk  (07/28/2023)   Overall Financial Resource Strain (CARDIA)    Difficulty of Paying Living Expenses: Not hard at all  Food Insecurity: No Food Insecurity (07/28/2023)   Hunger Vital Sign    Worried About Running Out of Food in the Last Year: Never true    Ran Out of Food in the Last Year: Never true  Transportation Needs: No Transportation Needs (07/28/2023)   PRAPARE - Administrator, Civil Service (Medical): No    Lack of Transportation (Non-Medical): No  Physical Activity: Sufficiently Active (07/28/2023)   Exercise Vital Sign    Days of Exercise per Week: 7 days    Minutes of Exercise per Session: 120 min  Stress: No Stress Concern Present (07/28/2023)   [employer] of Occupational Health - Occupational Stress Questionnaire    Feeling of Stress : Not at all  Social Connections: Socially Integrated (07/28/2023)   Social Connection and Isolation Panel [NHANES]    Frequency of Communication with Friends and Family: More than three times a week    Frequency of Social Gatherings with Friends and Family: More than three times a week    Attends Religious Services: More than 4 times per year    Active Member of [employer] or Organizations: Yes    Attends Banker Meetings: 1 to 4 times per year    Marital Status: Married    Tobacco Counseling Counseling given: Not Answered    Clinical Intake:  Pre-visit preparation completed: Yes  Pain : No/denies pain     BMI - recorded: 25.6 Nutritional Status: BMI 25 -29  Overweight Diabetes: No  Lab Results  Component Value Date   HGBA1C 5.6 06/13/2023   HGBA1C 5.5 06/08/2022   HGBA1C 5.7 01/07/2022     How often do you need to have someone help you when you read instructions, pamphlets, or other written materials from your doctor or pharmacy?: 1 - Never  Interpreter Needed?: No  Information entered by :: Lanier Ensign, LPN   Activities of Daily Living     07/28/2023    3:05 PM  In your present state of health, do you have any difficulty performing the following activities:  Hearing? 0  Vision? 0  Difficulty concentrating or making decisions? 0  Walking or climbing stairs? 0  Dressing or bathing? 0  Doing errands, shopping? 0  Preparing Food and eating ? N  Using the Toilet? N  In the past six months, have you accidently leaked urine? N  Do you have problems with loss of bowel control? N  Managing your Medications? N  Managing your Finances? N  Housekeeping or managing your Housekeeping? N    Patient Care Team: Shelva Majestic, MD as PCP - General (Family Medicine) Maris Berger, MD as Consulting Physician (Ophthalmology)  Indicate any recent Medical Services you may have received from other than [facility] providers in the past year (date may be approximate).     Assessment:   This is a routine wellness examination for Frank Joseph.  Hearing/Vision screen Hearing Screening - Comments:: Pt denies any hearing issues  Vision Screening - Comments:: Wears rx glasses - up to date with routine eye exams with [facility]      Goals Addressed             This Visit's Progress    Patient Stated       Maintain health and activity        Depression Screen     07/28/2023    3:08 PM 06/13/2023    8:03 AM 12/06/2022    8:05 AM 08/10/2022   11:00 AM 07/22/2022    3:30 PM 06/08/2022    8:12 AM 06/09/2021    8:08 AM  PHQ 2/9 Scores  PHQ - 2 Score 0 0 0 0 0 0 0  PHQ- 9 Score   0 0 0 0     Fall Risk     07/28/2023    3:10  PM 06/13/2023    8:03 AM 12/06/2022    8:04 AM 08/10/2022   11:00 AM 07/22/2022    3:31 PM  Fall Risk   Falls in the past year? 0 0 0 0 0  Number falls in past yr: 0 0 0 0 0  Injury with Fall? 0 0 0 0 0  Risk for fall due to : No Fall Risks No Fall Risks No Fall Risks No Fall Risks Impaired vision  Follow up Falls prevention discussed  Falls evaluation completed Falls evaluation completed Falls prevention discussed    MEDICARE RISK AT HOME:  Medicare Risk at Home Any stairs in or around the home?: Yes If so, are there any without handrails?: No Home free of loose throw rugs in walkways, pet beds, electrical cords, etc?: Yes Adequate lighting in your home to reduce risk of falls?: Yes Life alert?: No Use of a cane, walker or w/c?: No Grab bars in the bathroom?: No Shower chair or bench in shower?: No Elevated toilet seat or a handicapped toilet?: No  TIMED UP AND GO:  Was the test performed?  No  Cognitive Function: 6CIT completed        07/28/2023    3:10 PM 07/22/2022    3:32 PM 06/09/2021    8:13 AM  6CIT Screen  What Year? 0 points 0 points 0 points  What month? 0 points 0 points 0 points  What time? 0 points 0 points 0 points  Count back from 20 0 points 0 points 0 points  Months in reverse 0 points 0 points 0 points  Repeat phrase 0 points 0 points 0 points  Total Score 0 points 0 points 0 points    Immunizations Immunization History  Administered Date(s)  Administered   Influenza Split 04/23/2011   Influenza Whole 01/03/2009   Influenza,inj,Quad PF,6+ Mos 01/04/2014, 01/22/2015, 05/25/2016, 05/13/2017, 04/04/2018, 02/20/2019, 01/08/2022   PFIZER(Purple Top)SARS-COV-2 Vaccination 07/01/2019, 08/10/2019, 01/29/2020   Pneumococcal Polysaccharide-23 06/03/2020   Td 04/26/2006   Tdap 05/25/2016   Zoster Recombinant(Shingrix) 05/29/2018, 09/26/2018    Screening Tests Health Maintenance  Topic Date Due   COVID-19 Vaccine (4 - 2024-25 season) 12/26/2022    Pneumonia Vaccine 45+ Years old (2 of 2 - PCV) 06/12/2024 (Originally 06/03/2021)   INFLUENZA VACCINE  11/25/2023   Medicare Annual Wellness (AWV)  07/27/2024   Colonoscopy  11/01/2025   DTaP/Tdap/Td (3 - Td or Tdap) 05/25/2026   Hepatitis C Screening  Completed   Zoster Vaccines- Shingrix  Completed   HPV VACCINES  Aged Out    Health Maintenance  Health Maintenance Due  Topic Date Due   COVID-19 Vaccine (4 - 2024-25 season) 12/26/2022   Health Maintenance Items Addressed: See Nurse Notes  Additional Screening:  Vision Screening: Recommended annual ophthalmology exams for early detection of glaucoma and other disorders of the eye.  Dental Screening: Recommended annual dental exams for proper oral hygiene  Community Resource Referral / Chronic Care Management: CRR required this visit?  No   CCM required this visit?  No     Plan:     I have personally reviewed and noted the following in the patient's chart:   Medical and social history Use of alcohol, tobacco or illicit drugs  Current medications and supplements including opioid prescriptions. Patient is not currently taking opioid prescriptions. Functional ability and status Nutritional status Physical activity Advanced directives List of other physicians Hospitalizations, surgeries, and ER visits in previous 12 months Vitals Screenings to include cognitive, depression, and falls Referrals and appointments  In addition, I have reviewed and discussed with patient certain preventive protocols, quality metrics, and best practice recommendations. A written personalized care plan for preventive services as well as general preventive health recommendations were provided to patient.     Marzella Schlein, LPN   08/27/6642   After Visit Summary: (MyChart) Due to this being a telephonic visit, the after visit summary with patients personalized plan was offered to patient via MyChart   Notes: Nothing significant to report at  this time.

## 2023-08-23 DIAGNOSIS — H5213 Myopia, bilateral: Secondary | ICD-10-CM | POA: Diagnosis not present

## 2023-09-12 ENCOUNTER — Encounter: Payer: Self-pay | Admitting: Family Medicine

## 2023-12-12 ENCOUNTER — Encounter: Payer: Self-pay | Admitting: Family Medicine

## 2023-12-12 ENCOUNTER — Ambulatory Visit: Payer: Self-pay | Admitting: Family Medicine

## 2023-12-12 ENCOUNTER — Ambulatory Visit (INDEPENDENT_AMBULATORY_CARE_PROVIDER_SITE_OTHER): Payer: Medicare Other | Admitting: Family Medicine

## 2023-12-12 VITALS — BP 133/75 | HR 63 | Temp 99.1°F | Ht 73.0 in | Wt 194.4 lb

## 2023-12-12 DIAGNOSIS — R739 Hyperglycemia, unspecified: Secondary | ICD-10-CM

## 2023-12-12 DIAGNOSIS — Z131 Encounter for screening for diabetes mellitus: Secondary | ICD-10-CM | POA: Diagnosis not present

## 2023-12-12 DIAGNOSIS — E782 Mixed hyperlipidemia: Secondary | ICD-10-CM

## 2023-12-12 DIAGNOSIS — I10 Essential (primary) hypertension: Secondary | ICD-10-CM

## 2023-12-12 DIAGNOSIS — G43809 Other migraine, not intractable, without status migrainosus: Secondary | ICD-10-CM

## 2023-12-12 LAB — CBC WITH DIFFERENTIAL/PLATELET
Basophils Absolute: 0 K/uL (ref 0.0–0.1)
Basophils Relative: 0.8 % (ref 0.0–3.0)
Eosinophils Absolute: 0.1 K/uL (ref 0.0–0.7)
Eosinophils Relative: 2.5 % (ref 0.0–5.0)
HCT: 48.4 % (ref 39.0–52.0)
Hemoglobin: 16.4 g/dL (ref 13.0–17.0)
Lymphocytes Relative: 23.1 % (ref 12.0–46.0)
Lymphs Abs: 1.1 K/uL (ref 0.7–4.0)
MCHC: 33.8 g/dL (ref 30.0–36.0)
MCV: 94.6 fl (ref 78.0–100.0)
Monocytes Absolute: 0.5 K/uL (ref 0.1–1.0)
Monocytes Relative: 10 % (ref 3.0–12.0)
Neutro Abs: 3 K/uL (ref 1.4–7.7)
Neutrophils Relative %: 63.6 % (ref 43.0–77.0)
Platelets: 118 K/uL — ABNORMAL LOW (ref 150.0–400.0)
RBC: 5.12 Mil/uL (ref 4.22–5.81)
RDW: 12.9 % (ref 11.5–15.5)
WBC: 4.7 K/uL (ref 4.0–10.5)

## 2023-12-12 LAB — COMPREHENSIVE METABOLIC PANEL WITH GFR
ALT: 29 U/L (ref 0–53)
AST: 27 U/L (ref 0–37)
Albumin: 4.6 g/dL (ref 3.5–5.2)
Alkaline Phosphatase: 48 U/L (ref 39–117)
BUN: 13 mg/dL (ref 6–23)
CO2: 28 meq/L (ref 19–32)
Calcium: 9.6 mg/dL (ref 8.4–10.5)
Chloride: 101 meq/L (ref 96–112)
Creatinine, Ser: 1.01 mg/dL (ref 0.40–1.50)
GFR: 76.28 mL/min (ref >60.00)
Glucose, Bld: 110 mg/dL — ABNORMAL HIGH (ref 70–99)
Potassium: 4.4 meq/L (ref 3.5–5.1)
Sodium: 139 meq/L (ref 135–145)
Total Bilirubin: 1.4 mg/dL — ABNORMAL HIGH (ref 0.2–1.2)
Total Protein: 6.9 g/dL (ref 6.0–8.3)

## 2023-12-12 LAB — URINALYSIS, ROUTINE W REFLEX MICROSCOPIC
Bilirubin Urine: NEGATIVE
Hgb urine dipstick: NEGATIVE
Ketones, ur: NEGATIVE
Leukocytes,Ua: NEGATIVE
Nitrite: NEGATIVE
RBC / HPF: NONE SEEN (ref 0–?)
Specific Gravity, Urine: 1.02 (ref 1.000–1.030)
Total Protein, Urine: NEGATIVE
Urine Glucose: NEGATIVE
Urobilinogen, UA: 0.2 (ref 0.0–1.0)
WBC, UA: NONE SEEN (ref 0–?)
pH: 6 (ref 5.0–8.0)

## 2023-12-12 LAB — HEMOGLOBIN A1C: Hgb A1c MFr Bld: 5.7 % (ref 4.6–6.5)

## 2023-12-12 MED ORDER — SUMATRIPTAN SUCCINATE 100 MG PO TABS: 100.0000 mg | ORAL_TABLET | ORAL | 5 refills | Status: AC | PRN

## 2023-12-12 NOTE — Patient Instructions (Addendum)
 Please stop by lab before you go If you have mychart- we will send your results within 3 business days of us  receiving them.  If you do not have mychart- we will call you about results within 5 business days of us  receiving them.  *please also note that you will see labs on mychart as soon as they post. I will later go in and write notes on them- will say notes from Dr. Katrinka   I love your goal of getting under 14 a week at least with alcohol for blood pressure and cholesterol.   Recommended follow up: Return in about 6 months (around 06/13/2024) for physical or sooner if needed.Schedule b4 you leave.

## 2023-12-12 NOTE — Progress Notes (Signed)
 Phone [phone] In person visit   Subjective:   Frank Joseph. is a 68 y.o. year old very pleasant male patient who presents for/with See problem oriented charting Chief Complaint  Patient presents with   Hyperlipidemia   Hyperglycemia   Migraine    Needs refill on Sumatriptan , has not been filled since 2022     Past Medical History-  Patient Active Problem List   Diagnosis Date Noted   History of bacteremia 02/26/2018    Priority: High   UTI (urinary tract infection) 02/25/2018    Priority: High   History of adenomatous polyp of colon 08/15/2017    Priority: Medium    White coat syndrome with diagnosis of hypertension 12/09/2009    Priority: Medium    Hyperlipidemia 03/07/2007    Priority: Medium    Migraine 12/08/2006    Priority: Medium    Hyperglycemia 05/21/2016    Priority: Low   Thrombocytopenia (HCC) 05/21/2016    Priority: Low   Allergic rhinitis 01/04/2014    Priority: Low   Anxiety state 05/25/2010    Priority: Low    Medications- reviewed and updated Current Outpatient Medications  Medication Sig Dispense Refill   bisoprolol -hydrochlorothiazide  (ZIAC ) 10-6.25 MG tablet Take 1 tablet by mouth daily. 90 tablet 3   ezetimibe -simvastatin  (VYTORIN ) 10-20 MG tablet Take 1 tablet by mouth at bedtime. 90 tablet 3   lisinopril  (ZESTRIL ) 10 MG tablet TAKE 1 TABLET(10 MG) BY MOUTH DAILY 90 tablet 3   meloxicam  (MOBIC ) 15 MG tablet Take 1 tablet (15 mg total) by mouth daily as needed for pain. 30 tablet 0   multivitamin (THERAGRAN) per tablet Take 1 tablet by mouth daily.     triamcinolone  (NASACORT  ALLERGY 24HR) 55 MCG/ACT AERO nasal inhaler Place 2 sprays into the nose daily.     SUMAtriptan  (IMITREX ) 100 MG tablet Take 1 tablet (100 mg total) by mouth every 2 (two) hours as needed for migraine (max 2 a day.). May repeat in 2 hours if headache persists or recurs. 10 tablet 5   No current facility-administered medications for this visit.      Objective:  BP 133/75   Pulse 63   Temp 99.1 F (37.3 C) (Temporal)   Ht 6' 1 (1.854 m)   Wt 194 lb 6.4 oz (88.2 kg)   SpO2 97%   BMI 25.65 kg/m  Gen: NAD, resting comfortably Mild amount of clear drainage in the nasal cavity, tympanic membranes normal bilaterally and visualized portions-some cerumen noted with mild obstruction CV: RRR no murmurs rubs or gallops Lungs: CTAB no crackles, wheeze, rhonchi Skin: warm, dry     Assessment and Plan   # Elevated PSA-noted at 65-month visit but on recheck 1 month later was down to 1.37- offered repeat but with improvement hold off    #Hypertension- white coat hypertension  S: Compliant with lisinopril  10 mg, bisoprolol  hydrochlorothiazide  10-6.25 mg.   Home blood pressure monitoring- 133/75  BP Readings from Last 3 Encounters:  12/12/23 133/75  06/13/23 (!) 148/92  05/24/23 138/88  A/P: well controlled at home- continue current medications    #Hyperlipidemia S: Compliant with Vytorin  10-20 mg Lab Results  Component Value Date   CHOL 141 06/13/2023   HDL 46.40 06/13/2023   LDLCALC 47 06/13/2023   LDLDIRECT 61.0 06/08/2022   TRIG 237.0 (H) 06/13/2023   CHOLHDL 3 06/13/2023  A/P: really good control last visit other than midlly high triglyceride(s) - continue efforts for healthy eating and regular exercise   -  is trying to reduce alcohol from 21 a week- would help blood pressure , cholesterol, cancer risk.   #Migraines S: Typical pattern is approximately 3 times a year.  Sumatriptan  100 mg is helpful. A/P: doing well with infrequent use- refill today   #Allergic rhinitis S: zyrtec helpful recently  but uses sparingly due to some side effects . Clear discharge most mornings but clears this and feels better -  also on nasocort regularly -astelin  with flares in past A/P: overall doing well- continue current medications    #Thrombocytopenia- has had thrombocytopenia for years.  Stable 6 months ago - update today  #ankle  still bothering him- plans to return to Dr. Leonce. Hip doing better . -sparing meloxicam - maybe once a week- monitor CMP   #history of sepsis from urinary tract infection- wants to check UA   Recommended follow up: Return in about 6 months (around 06/13/2024) for physical or sooner if needed.Schedule b4 you leave. Future Appointments  Date Time Provider Department Center  08/02/2024  2:20 PM [facility] ANNUAL WELLNESS VISIT 1 [facility]    Lab/Order associations:   ICD-10-CM   1. Mixed hyperlipidemia  E78.2 CBC with Differential/Platelet    Comprehensive metabolic panel with GFR    2. White coat syndrome with diagnosis of hypertension  I10 Urinalysis, Routine w reflex microscopic    3. Hyperglycemia  R73.9 Hemoglobin A1c    4. Screening for diabetes mellitus  Z13.1 Hemoglobin A1c    5. Other migraine without status migrainosus, not intractable  G43.809       Meds ordered this encounter  Medications   SUMAtriptan  (IMITREX ) 100 MG tablet    Sig: Take 1 tablet (100 mg total) by mouth every 2 (two) hours as needed for migraine (max 2 a day.). May repeat in 2 hours if headache persists or recurs.    Dispense:  10 tablet    Refill:  5    Return precautions advised.  Garnette Lukes, MD

## 2024-05-16 ENCOUNTER — Ambulatory Visit: Admitting: Sports Medicine

## 2024-05-16 VITALS — BP 124/76 | HR 83 | Ht 73.0 in | Wt 202.0 lb

## 2024-05-16 DIAGNOSIS — M25571 Pain in right ankle and joints of right foot: Secondary | ICD-10-CM | POA: Diagnosis not present

## 2024-05-16 DIAGNOSIS — M19071 Primary osteoarthritis, right ankle and foot: Secondary | ICD-10-CM | POA: Diagnosis not present

## 2024-05-16 DIAGNOSIS — G8929 Other chronic pain: Secondary | ICD-10-CM | POA: Diagnosis not present

## 2024-05-16 MED ORDER — MELOXICAM 15 MG PO TABS: ORAL_TABLET | ORAL | 1 refills | Status: AC

## 2024-05-16 NOTE — Patient Instructions (Signed)
-   Start meloxicam  15 mg daily x2 weeks.  If still having pain after 2 weeks, complete 3rd-week of NSAID. May use remaining NSAID as needed once daily for pain control.  Do not to use additional over-the-counter NSAIDs (ibuprofen, naproxen, Advil, Aleve, etc.) while taking prescription NSAIDs.  May use Tylenol  [phone] mg 2 to 3 times a day for breakthrough pain.  Continue HEP  May use ankle brace when physically active   As needed follow up if no improvement 1-2 month follow upm

## 2024-05-16 NOTE — Progress Notes (Signed)
 "              [facility] [address] [place] [address] Phone: [phone]   Assessment and Plan:     1. Primary osteoarthritis of right ankle (Primary) 2. Chronic pain of right ankle -Chronic with exacerbation, subsequent visit - Continued flares of right ankle pain consistent with flare of osteoarthritis leading to surrounding intermittent flares of Achilles tendinitis, plantar fasciitis, tendinopathy's -Patient has had significant relief in the past with meloxicam  courses.  Will repeat course.  Start meloxicam  15 mg daily x2 weeks.  If still having pain after 2 weeks, complete 3rd-week of NSAID. May use remaining NSAID as needed once daily for pain control.  Do not to use additional over-the-counter NSAIDs (ibuprofen, naproxen, Advil, Aleve, etc.) while taking prescription NSAIDs.  May use Tylenol  [phone] mg 2 to 3 times a day for breakthrough pain.  -Continue HEP for ankle - May use rigid ankle brace during times of prolonged walking such as golf.  Do not recommend daily use of rigid ankle brace to prevent stiffness, musculotendinous weakening   Pertinent previous records reviewed include none   Follow Up: As needed if no improvement or worsening of symptoms in 1 to 2 months.  Could consider CSI versus physical therapy versus advanced imaging   Subjective:   I, Frank Joseph, am serving as a neurosurgeon for Doctor Morene Mace   Chief Complaint: right ankle and left hip pain    HPI:  12/13/2022 Patient is a 69 year old male complaining of right ankle and left hip pain . Patient states that he has been clearing trees and has been on his feet a lot and that has caused the right ankle to get worse. Patient states he has sprained his right ankle a lot through his life playing sports. Locates pain to lateral ankle pain. Using ice,  heat, voltaren gel.    Tweaked hip playing golf of lifting furniture. Locates the pain lateral left hip pain  worse when sitting to long after stretching is better. No pain and aching just bothering him. Does do exercises to help the hips.    01/10/2023 Patient states that he is better   01/27/2023 Patient states that he is much better , ankle a little flare. Hip no issues    05/24/2023 Patient states extended sitting or driving hurts, but is better when he is doing things. Takes meloxicam  when he needs it ready for injections.   05/16/2024 Patient states right ankle pain was never gone completely   Relevant Historical Information: None pertinent  Additional pertinent review of systems negative.  Current Medications[1]   Objective:     Vitals:   05/16/24 0813  BP: 124/76  Pulse: 83  SpO2: 99%  Weight: 202 lb (91.6 kg)  Height: 6' 1 (1.854 m)      Body mass index is 26.65 kg/m.    Physical Exam:    Right ankle:  No deformity, lateral bony hypertrophy Mild swelling TTP ATFL, CFL NTTP over fibular head, lat mal, medial mal, achilles, navicular, base of 5th,  deltoid, calcaneous or midfoot ROM DF 30, PF 45, inv/ev intact Negative ant drawer, talar tilt, rotation test, squeeze test. Neg thompson No pain with resisted inversion or eversion   Electronically signed by:  [facility] 9:10 AM 05/16/24     [1]  Current Outpatient Medications:    meloxicam  (MOBIC ) 15 MG tablet, Take 1 tablet  daily for 2 weeks.  If still in pain after 2 weeks, take 1 tablet daily for an additional 1 week., Disp: 30 tablet, Rfl: 1   bisoprolol -hydrochlorothiazide  (ZIAC ) 10-6.25 MG tablet, Take 1 tablet by mouth daily., Disp: 90 tablet, Rfl: 3   ezetimibe -simvastatin  (VYTORIN ) 10-20 MG tablet, Take 1 tablet by mouth at bedtime., Disp: 90 tablet, Rfl: 3   lisinopril  (ZESTRIL ) 10 MG tablet, TAKE 1 TABLET(10 MG) BY MOUTH DAILY, Disp: 90 tablet, Rfl: 3   meloxicam  (MOBIC ) 15 MG tablet, Take 1 tablet (15 mg total) by mouth daily as needed for pain., Disp: 30 tablet, Rfl:  0   multivitamin (THERAGRAN) per tablet, Take 1 tablet by mouth daily., Disp: , Rfl:    SUMAtriptan  (IMITREX ) 100 MG tablet, Take 1 tablet (100 mg total) by mouth every 2 (two) hours as needed for migraine (max 2 a day.). May repeat in 2 hours if headache persists or recurs., Disp: 10 tablet, Rfl: 5   triamcinolone  (NASACORT  ALLERGY 24HR) 55 MCG/ACT AERO nasal inhaler, Place 2 sprays into the nose daily., Disp: , Rfl:   "

## 2024-07-05 ENCOUNTER — Encounter: Admitting: Family Medicine

## 2024-08-02 ENCOUNTER — Ambulatory Visit
